# Patient Record
Sex: Female | Born: 1945 | Race: White | Hispanic: No | Smoking: Former smoker
Health system: Southern US, Community
[De-identification: ages and names within clinical notes are randomized; demographics above are authoritative.]

## PROBLEM LIST (undated history)

## (undated) DIAGNOSIS — E079 Disorder of thyroid, unspecified: Secondary | ICD-10-CM

## (undated) DIAGNOSIS — F419 Anxiety disorder, unspecified: Secondary | ICD-10-CM

## (undated) DIAGNOSIS — C801 Malignant (primary) neoplasm, unspecified: Secondary | ICD-10-CM

## (undated) DIAGNOSIS — M1990 Unspecified osteoarthritis, unspecified site: Secondary | ICD-10-CM

## (undated) DIAGNOSIS — F32A Depression, unspecified: Secondary | ICD-10-CM

## (undated) DIAGNOSIS — T8859XA Other complications of anesthesia, initial encounter: Secondary | ICD-10-CM

## (undated) DIAGNOSIS — T4145XA Adverse effect of unspecified anesthetic, initial encounter: Secondary | ICD-10-CM

## (undated) DIAGNOSIS — J189 Pneumonia, unspecified organism: Secondary | ICD-10-CM

## (undated) DIAGNOSIS — R7303 Prediabetes: Secondary | ICD-10-CM

## (undated) DIAGNOSIS — R0602 Shortness of breath: Secondary | ICD-10-CM

## (undated) DIAGNOSIS — M069 Rheumatoid arthritis, unspecified: Secondary | ICD-10-CM

## (undated) DIAGNOSIS — M2550 Pain in unspecified joint: Secondary | ICD-10-CM

## (undated) DIAGNOSIS — F329 Major depressive disorder, single episode, unspecified: Secondary | ICD-10-CM

## (undated) DIAGNOSIS — T7840XA Allergy, unspecified, initial encounter: Secondary | ICD-10-CM

## (undated) DIAGNOSIS — R079 Chest pain, unspecified: Secondary | ICD-10-CM

## (undated) DIAGNOSIS — R011 Cardiac murmur, unspecified: Secondary | ICD-10-CM

## (undated) DIAGNOSIS — E039 Hypothyroidism, unspecified: Secondary | ICD-10-CM

## (undated) DIAGNOSIS — K219 Gastro-esophageal reflux disease without esophagitis: Secondary | ICD-10-CM

## (undated) DIAGNOSIS — I10 Essential (primary) hypertension: Secondary | ICD-10-CM

## (undated) DIAGNOSIS — E119 Type 2 diabetes mellitus without complications: Secondary | ICD-10-CM

## (undated) DIAGNOSIS — K589 Irritable bowel syndrome without diarrhea: Secondary | ICD-10-CM

## (undated) DIAGNOSIS — G4730 Sleep apnea, unspecified: Secondary | ICD-10-CM

## (undated) DIAGNOSIS — E559 Vitamin D deficiency, unspecified: Secondary | ICD-10-CM

## (undated) HISTORY — DX: Chest pain, unspecified: R07.9

## (undated) HISTORY — DX: Allergy, unspecified, initial encounter: T78.40XA

## (undated) HISTORY — DX: Disorder of thyroid, unspecified: E07.9

## (undated) HISTORY — DX: Pain in unspecified joint: M25.50

## (undated) HISTORY — DX: Irritable bowel syndrome, unspecified: K58.9

## (undated) HISTORY — DX: Vitamin D deficiency, unspecified: E55.9

## (undated) HISTORY — DX: Prediabetes: R73.03

## (undated) HISTORY — PX: CHOLECYSTECTOMY: SHX55

## (undated) HISTORY — DX: Hypothyroidism, unspecified: E03.9

## (undated) HISTORY — DX: Type 2 diabetes mellitus without complications: E11.9

## (undated) HISTORY — DX: Gastro-esophageal reflux disease without esophagitis: K21.9

## (undated) HISTORY — DX: Shortness of breath: R06.02

## (undated) HISTORY — DX: Unspecified osteoarthritis, unspecified site: M19.90

---
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
## 1970-11-05 DIAGNOSIS — C801 Malignant (primary) neoplasm, unspecified: Secondary | ICD-10-CM

## 1970-11-05 HISTORY — DX: Malignant (primary) neoplasm, unspecified: C80.1

## 1970-11-05 HISTORY — PX: DILATION AND CURETTAGE OF UTERUS: SHX78

## 1975-11-06 HISTORY — PX: ABDOMINAL HYSTERECTOMY: SHX81

## 1994-11-05 DIAGNOSIS — K219 Gastro-esophageal reflux disease without esophagitis: Secondary | ICD-10-CM

## 1994-11-05 HISTORY — DX: Gastro-esophageal reflux disease without esophagitis: K21.9

## 1998-08-19 ENCOUNTER — Emergency Department (HOSPITAL_COMMUNITY): Admission: EM | Admit: 1998-08-19 | Discharge: 1998-08-19 | Payer: Self-pay | Admitting: Emergency Medicine

## 1999-09-02 ENCOUNTER — Ambulatory Visit: Admission: RE | Admit: 1999-09-02 | Discharge: 1999-09-02 | Payer: Self-pay | Admitting: *Deleted

## 2007-11-06 DIAGNOSIS — J189 Pneumonia, unspecified organism: Secondary | ICD-10-CM

## 2007-11-06 HISTORY — DX: Pneumonia, unspecified organism: J18.9

## 2011-10-17 ENCOUNTER — Ambulatory Visit: Payer: Self-pay | Admitting: Physician Assistant

## 2011-11-06 HISTORY — PX: OTHER SURGICAL HISTORY: SHX169

## 2011-11-14 ENCOUNTER — Ambulatory Visit: Payer: Self-pay | Admitting: Physician Assistant

## 2011-11-14 DIAGNOSIS — E039 Hypothyroidism, unspecified: Secondary | ICD-10-CM

## 2012-04-16 ENCOUNTER — Other Ambulatory Visit: Payer: Self-pay | Admitting: Orthopaedic Surgery

## 2012-04-16 DIAGNOSIS — M25511 Pain in right shoulder: Secondary | ICD-10-CM

## 2012-04-20 ENCOUNTER — Ambulatory Visit
Admission: RE | Admit: 2012-04-20 | Discharge: 2012-04-20 | Disposition: A | Discharge status: Home / Self Care | Payer: Self-pay | Source: Ambulatory Visit | Attending: Orthopaedic Surgery | Admitting: Orthopaedic Surgery

## 2012-04-20 DIAGNOSIS — M25511 Pain in right shoulder: Secondary | ICD-10-CM

## 2012-04-28 ENCOUNTER — Telehealth: Payer: Self-pay

## 2012-04-28 NOTE — Telephone Encounter (Signed)
Linda Atkinson [facility] STATES THEY NEED THE LABS FROM January AND SHE HAD A PE DONE IN June AND IF SHE HAD A  EKG THEY NEED THAT ALSO PLEASE FAX TO [phone] AND THE PHONE NUMBER IS [phone]

## 2012-04-28 NOTE — Telephone Encounter (Signed)
Printed out phone message due to patient only has a paper chart.

## 2012-05-01 ENCOUNTER — Telehealth: Payer: Self-pay

## 2012-05-01 NOTE — Telephone Encounter (Signed)
[facility] is requesting last OV and Labs to be faxed to [phone]. Pt is going into surgery now.

## 2012-05-01 NOTE — Telephone Encounter (Signed)
Last office notes and labs sent with confirmation.

## 2012-07-08 ENCOUNTER — Other Ambulatory Visit: Payer: Self-pay | Admitting: Orthopaedic Surgery

## 2012-07-08 DIAGNOSIS — M25511 Pain in right shoulder: Secondary | ICD-10-CM

## 2012-07-10 ENCOUNTER — Ambulatory Visit
Admission: RE | Admit: 2012-07-10 | Discharge: 2012-07-10 | Disposition: A | Discharge status: Home / Self Care | Payer: Medicare Other | Source: Ambulatory Visit | Attending: Orthopaedic Surgery | Admitting: Orthopaedic Surgery

## 2012-07-10 DIAGNOSIS — M25511 Pain in right shoulder: Secondary | ICD-10-CM

## 2012-07-10 MED ORDER — IOHEXOL 180 MG/ML  SOLN
17.0000 mL | Freq: Once | INTRAMUSCULAR | Status: AC | PRN
  Administered 2012-07-10: 17 mL via INTRA_ARTICULAR

## 2012-08-06 ENCOUNTER — Ambulatory Visit: Payer: Medicare Other | Admitting: Physician Assistant

## 2013-06-10 ENCOUNTER — Other Ambulatory Visit (HOSPITAL_COMMUNITY): Payer: Self-pay | Admitting: Rheumatology

## 2013-06-10 ENCOUNTER — Ambulatory Visit: Payer: Medicare Other | Admitting: Endocrinology

## 2013-06-10 DIAGNOSIS — R6889 Other general symptoms and signs: Secondary | ICD-10-CM

## 2013-06-11 ENCOUNTER — Other Ambulatory Visit: Payer: Self-pay | Admitting: *Deleted

## 2013-06-11 ENCOUNTER — Ambulatory Visit (INDEPENDENT_AMBULATORY_CARE_PROVIDER_SITE_OTHER): Payer: Medicare Other | Admitting: Endocrinology

## 2013-06-11 ENCOUNTER — Encounter: Payer: Self-pay | Admitting: Endocrinology

## 2013-06-11 VITALS — BP 134/82 | HR 80 | Temp 98.1°F | Resp 12 | Ht 67.0 in | Wt 242.5 lb

## 2013-06-11 DIAGNOSIS — E213 Hyperparathyroidism, unspecified: Secondary | ICD-10-CM

## 2013-06-11 DIAGNOSIS — E039 Hypothyroidism, unspecified: Secondary | ICD-10-CM

## 2013-06-11 MED ORDER — SYNTHROID 175 MCG PO TABS: 175.0000 ug | ORAL_TABLET | Freq: Every day | ORAL | Status: DC

## 2013-06-11 NOTE — Patient Instructions (Signed)
Keep hydrated  Synthroid 175ug daily

## 2013-06-11 NOTE — Progress Notes (Signed)
Patient ID: Linda Atkinson, female   DOB: [birth year], 67 y.o.   MRN: [id number]   History of Present Illness:  Chief complaint: High calcium level  She thinks her calcium was found to be high about 10 years ago but no details are available. She was followed by another endocrinologist at that time and surgery not recommended. 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 She also gets sleepy during the day often Also complains of feeling a little unsteady or staggering while walking and feels somewhat weak Since about November of 2013 she has had multiple joint pains, starting with her shoulders but now in practically all of her extremity joints including her feet. She does not complain of any nausea but she complains of excessive thirst over the last 2-3 years which is worse now She has not had any recent fractures She thinks she had passed a kidney stone 2-3 years ago because of severe pain and relief on passing the stone  CALCIUM levels: On 04/29/13 calcium was 12.4 On 06/08/13 calcium is 14.5  Past Medical History  Diagnosis Date  . Allergy   . Arthritis   . Thyroid disease     Past Surgical History  Procedure Laterality Date  . Right shoulder rotater cuff    . Abdominal hysterectomy      History reviewed. No pertinent family history.  Social History:  reports that she has been smoking.  She does not have any smokeless tobacco history on file. Her alcohol and drug histories are not on file.  Allergies: No Known Allergies    Medication List       This list is accurate as of: 06/11/13  8:17 AM.  Always use your most recent med list.               levothyroxine 200 MCG  tablet  Commonly known as:  SYNTHROID, LEVOTHROID  Take 200 mcg by mouth daily before breakfast. Brand name Synthroid     Vitamin D (Ergocalciferol) 50000 UNITS Caps capsule  Commonly known as:  DRISDOL  Take 50,000 Units by mouth 2 (two) times a week.          REVIEW OF SYSTEMS:           No unusual headaches.     Skin: No rash or infections     Thyroid:  She apparently has had primary hypothyroidism for about 20 years and this is generally followed by her primary care physician. No cold intolerance, unusual fatigue at this time. She is not sure when her previous tests were done but recently her TSH in June was 0.26 which was done as part of the panel by the orthopedic surgeon. The free T4 is not available.However her dose has not been changed.     His blood pressure has been normal.     No swelling of feet.     No shortness of breath on exertion.     Bowel habits:  No change. . Abdominal pain not present   PHYSICAL EXAM:  BP 134/82  Pulse 80  Temp(Src) 98.1 F (36.7 C)  Resp 12  Ht 5\' 7"  (1.702 m)  Wt 242 lb 8 oz (109.997 kg)  BMI 37.97 kg/m2  SpO2 94%  GENERAL:  No pallor, clubbing, lymphadenopathy or edema.  Skin:  no rash or pigmentation.  EYES:  Externally normal.  Fundii:  normal discs and vessels.  ENT: Oral mucosa and tongue normal.  THYROID:  Not palpable.  HEART:  NormalS1 and S2; no murmur or click.  CHEST:  Normal shape .  Lungs:  Vescicular breath sounds heard equally.  No crepitations/ wheeze.  ABDOMEN:  No distention.  Liver and spleen not palpable.  No other mass or tenderness.  RECTAL exam:   NEUROLOGICAL: Reflexes are normal bilaterally at biceps.  SPINE AND JOINTS:  Normal without any inflammatory changes or swelling   ASSESSMENT:    Hyperparathyroidism, long-standing but with escalating calcium levels recently She is probably symptomatic with generalized arthralgia, fatigue, increased thirst, gait instability and likely history  of kidney stone about 2 years ago  She appears to have palpable nodule in the left neck; because of her markedly increased PTH level this may be a large adenoma or possibly carcinoma Previously she had been reluctant to have surgery because of potential injury to the laryngeal nerve.  Now she appears to have reviewed more information about the minimally invasive surgery and is agreeable to this as a treatment Discussed that we would do a scan to identify the abnormal parathyroid, presumably she has a solitary adenoma  VITAMIN D deficiency: She has a level less than 10 and has been started on supplementation. Her alkaline phosphatase is increased to about twice normal but she has not had any x-rays to identify any bony changes  PLAN:   Will set her up for a sestamibi parathyroid scan and also surgical consult She should have surgery as soon as possible, discussed risks of severe hypercalcemia and symptoms to look out for including nausea and drowsiness She will also try to keep hydrated as much as possible Do not think that she really needs a bone scan as her symptoms are likely to be from hyperparathyroidism.  Also she may have a degree of osteomalacia with her severe vitamin D deficiency causing the high alkaline phosphate level and this can be followed periodically after treatment  Zakir Henner 06/11/2013, 8:17 AM

## 2013-06-12 ENCOUNTER — Ambulatory Visit (INDEPENDENT_AMBULATORY_CARE_PROVIDER_SITE_OTHER): Payer: Medicare Other | Admitting: General Surgery

## 2013-06-12 ENCOUNTER — Encounter (INDEPENDENT_AMBULATORY_CARE_PROVIDER_SITE_OTHER): Payer: Self-pay | Admitting: General Surgery

## 2013-06-12 VITALS — BP 130/78 | HR 66 | Temp 97.2°F | Resp 15 | Ht 67.5 in | Wt 240.6 lb

## 2013-06-12 DIAGNOSIS — E210 Primary hyperparathyroidism: Secondary | ICD-10-CM

## 2013-06-12 NOTE — Progress Notes (Signed)
Patient ID: Linda Atkinson, female   DOB: November 18, 1945, 67 y.o.   MRN: [id number]  Chief Complaint  Patient presents with  . New Evaluation    hyperthyroidism    HPI [name] is a 67 y.o. female.  The patient is a 67 year old female who is referred by Dr. Lucianne Muss for an evaluation of hyperparathyroidism and hypercalcemia. Pacing she's had a long history of approximately 73-40 years old and known hypercalcemia and hyperparathyroidism. Patient has continued to have bony/muscle pains. Patient is to Dr. Shan Levans for workup of arthritis.  Upon Dr. Remus Blake workup patient has a parathyroid hormone level of 939.9 the calcium level 14.5. History she's had no cramping or kidney stone problems. She describes no GI symptoms at this time however does note some mental haziness as well as a bone pain.  HPI  Past Medical History  Diagnosis Date  . Allergy   . Arthritis   . Thyroid disease   . GERD (gastroesophageal reflux disease)     Past Surgical History  Procedure Laterality Date  . Right shoulder rotater cuff    . Abdominal hysterectomy      Family History  Problem Relation Age of Onset  . Cancer Brother     prostate  . Cancer Maternal Aunt     breast  . Cancer Paternal Aunt     uterine  . Cancer Paternal Grandmother     colon    Social History History  Substance Use Topics  . Smoking status: Current Every Day Smoker  . Smokeless tobacco: Never Used  . Alcohol Use: No    No Known Allergies  Current Outpatient Prescriptions  Medication Sig Dispense Refill  . SYNTHROID 175 MCG tablet Take 1 tablet (175 mcg total) by mouth daily before breakfast.  30 tablet  5  . Vitamin D, Ergocalciferol, (DRISDOL) 50000 UNITS CAPS capsule Take 50,000 Units by mouth 2 (two) times a week.       No current facility-administered medications for this visit.    Review of Systems Review of Systems  Blood pressure 130/78, pulse 66, temperature 97.2 F (36.2 C), temperature source Temporal,  resp. rate 15, height 5' 7.5" (1.715 m), weight 240 lb 9.6 oz (109.135 kg).  Physical Exam Physical Exam  Data Reviewed As above  Sestamibi scan pending  Assessment    67 year old female with primary hyperparathyroidism likely due to an adenoma but however cannot rule out carcinoma.     Plan    1. We will attempt to move up her sestamibi scan to expedite her surgery date. 2. After sestamibi results return will proceed with a minimally invasive parathyroidectomy if amenable and only one adenomas seen. It is one one adenoma will proceed with a routine open parathyroidectomy. 3. I discussed with the patient risk benefits of surgery to include infection, bleeding, possible recurrence of adenoma or missed adenoma, and the possibility for recurrent laryngeal nerve damage.       Marigene Ehlers., Sagar Tengan 06/12/2013, 2:45 PM

## 2013-06-17 ENCOUNTER — Encounter (INDEPENDENT_AMBULATORY_CARE_PROVIDER_SITE_OTHER): Payer: Self-pay

## 2013-06-19 ENCOUNTER — Encounter (HOSPITAL_COMMUNITY)
Admission: RE | Admit: 2013-06-19 | Discharge: 2013-06-19 | Disposition: A | Discharge status: Home / Self Care | Payer: Medicare Other | Source: Ambulatory Visit | Attending: Rheumatology | Admitting: Rheumatology

## 2013-06-19 ENCOUNTER — Encounter (HOSPITAL_COMMUNITY): Payer: Self-pay

## 2013-06-19 DIAGNOSIS — R6889 Other general symptoms and signs: Secondary | ICD-10-CM | POA: Insufficient documentation

## 2013-06-19 MED ORDER — TECHNETIUM TC 99M MEDRONATE IV KIT
25.8000 | PACK | Freq: Once | INTRAVENOUS | Status: AC | PRN
  Administered 2013-06-19: 25.8 via INTRAVENOUS

## 2013-06-24 ENCOUNTER — Encounter (HOSPITAL_COMMUNITY)
Admission: RE | Admit: 2013-06-24 | Discharge: 2013-06-24 | Disposition: A | Discharge status: Home / Self Care | Payer: Medicare Other | Source: Ambulatory Visit | Attending: Endocrinology | Admitting: Endocrinology

## 2013-06-24 DIAGNOSIS — E213 Hyperparathyroidism, unspecified: Secondary | ICD-10-CM | POA: Insufficient documentation

## 2013-06-24 MED ORDER — TECHNETIUM TC 99M SESTAMIBI GENERIC - CARDIOLITE
25.5000 | Freq: Once | INTRAVENOUS | Status: AC | PRN
  Administered 2013-06-24: 26 via INTRAVENOUS

## 2013-06-25 ENCOUNTER — Encounter (HOSPITAL_COMMUNITY): Payer: Medicare Other

## 2013-06-25 ENCOUNTER — Telehealth: Payer: Self-pay | Admitting: *Deleted

## 2013-06-25 NOTE — Telephone Encounter (Signed)
Pt had her scan done yesterday, she wanted to know if you had the results back and what the next step will be for her?

## 2013-06-29 ENCOUNTER — Encounter: Payer: Self-pay | Admitting: Physician Assistant

## 2013-06-29 DIAGNOSIS — M2550 Pain in unspecified joint: Secondary | ICD-10-CM | POA: Insufficient documentation

## 2013-06-30 ENCOUNTER — Other Ambulatory Visit (INDEPENDENT_AMBULATORY_CARE_PROVIDER_SITE_OTHER): Payer: Self-pay | Admitting: General Surgery

## 2013-06-30 ENCOUNTER — Telehealth (INDEPENDENT_AMBULATORY_CARE_PROVIDER_SITE_OTHER): Payer: Self-pay | Admitting: General Surgery

## 2013-06-30 DIAGNOSIS — E213 Hyperparathyroidism, unspecified: Secondary | ICD-10-CM

## 2013-06-30 NOTE — Telephone Encounter (Signed)
Ct schd for 8/29 @ 8:15 [address] and pt notified.Marland KitchenCMET lab order also placed in epic and she will go and have that drawn tomorrow morning per the patient...waiting on a call back from AR then orders will be taken around to Debbie E...it is 3:50 8/26 as of now

## 2013-06-30 NOTE — Telephone Encounter (Signed)
She is supposed to have a parathyroid surgery scheduled by the surgeon's office, she should check with them as she has a large growth on the parathyroid

## 2013-06-30 NOTE — Telephone Encounter (Signed)
Pt is going for bloodwork at Dr. Derrell Lolling and is going for a ct scan on Friday.

## 2013-06-30 NOTE — Telephone Encounter (Signed)
Message copied by June Leap on Tue Jun 30, 2013  3:48 PM ------      Message from: Axel Filler      Created: Tue Jun 30, 2013 10:53 AM       Can we set her up for a CT of her Neck Thin Cuts with IV contrast?      If we can get that done before the day that shes on hold with Debbie we can schedule it.            Thanks      AR ------

## 2013-07-01 ENCOUNTER — Other Ambulatory Visit (INDEPENDENT_AMBULATORY_CARE_PROVIDER_SITE_OTHER): Payer: Self-pay | Admitting: General Surgery

## 2013-07-01 LAB — COMPREHENSIVE METABOLIC PANEL
ALT: 38 U/L — ABNORMAL HIGH (ref 0–35)
AST: 24 U/L (ref 0–37)
Albumin: 4 g/dL (ref 3.5–5.2)
CO2: 33 mEq/L — ABNORMAL HIGH (ref 19–32)
Calcium: 13.9 mg/dL (ref 8.4–10.5)
Chloride: 103 mEq/L (ref 96–112)
Creat: 0.64 mg/dL (ref 0.50–1.10)
Potassium: 2.8 mEq/L — ABNORMAL LOW (ref 3.5–5.3)

## 2013-07-02 ENCOUNTER — Telehealth (INDEPENDENT_AMBULATORY_CARE_PROVIDER_SITE_OTHER): Payer: Self-pay | Admitting: *Deleted

## 2013-07-02 NOTE — Telephone Encounter (Signed)
Spoke to Dr. Derrell Lolling who is aware of patient's elevated calcium and recent lab results.

## 2013-07-03 ENCOUNTER — Encounter (HOSPITAL_COMMUNITY): Payer: Self-pay | Admitting: Pharmacy Technician

## 2013-07-03 ENCOUNTER — Ambulatory Visit
Admission: RE | Admit: 2013-07-03 | Discharge: 2013-07-03 | Disposition: A | Discharge status: Home / Self Care | Payer: Medicare Other | Source: Ambulatory Visit | Attending: General Surgery | Admitting: General Surgery

## 2013-07-03 MED ORDER — IOHEXOL 300 MG/ML  SOLN
75.0000 mL | Freq: Once | INTRAMUSCULAR | Status: AC | PRN
  Administered 2013-07-03: 75 mL via INTRAVENOUS

## 2013-07-03 NOTE — Progress Notes (Signed)
Dr Derrell Lolling-  NEED PRE OP ORDERS PLEASE-   Has PST appt 07/09/13  [facility]

## 2013-07-08 NOTE — Progress Notes (Signed)
Need orders please - pt coming for preop Thurs 07/09/13 - thank you

## 2013-07-09 ENCOUNTER — Encounter (HOSPITAL_COMMUNITY)
Admission: RE | Admit: 2013-07-09 | Discharge: 2013-07-09 | Disposition: A | Discharge status: Home / Self Care | Payer: Medicare Other | Source: Ambulatory Visit | Attending: General Surgery | Admitting: General Surgery

## 2013-07-09 ENCOUNTER — Other Ambulatory Visit (INDEPENDENT_AMBULATORY_CARE_PROVIDER_SITE_OTHER): Payer: Self-pay | Admitting: General Surgery

## 2013-07-09 ENCOUNTER — Encounter (HOSPITAL_COMMUNITY): Payer: Self-pay

## 2013-07-09 ENCOUNTER — Telehealth (INDEPENDENT_AMBULATORY_CARE_PROVIDER_SITE_OTHER): Payer: Self-pay | Admitting: General Surgery

## 2013-07-09 DIAGNOSIS — Z01812 Encounter for preprocedural laboratory examination: Secondary | ICD-10-CM | POA: Insufficient documentation

## 2013-07-09 DIAGNOSIS — E213 Hyperparathyroidism, unspecified: Secondary | ICD-10-CM | POA: Insufficient documentation

## 2013-07-09 HISTORY — DX: Adverse effect of unspecified anesthetic, initial encounter: T41.45XA

## 2013-07-09 HISTORY — DX: Rheumatoid arthritis, unspecified: M06.9

## 2013-07-09 HISTORY — DX: Depression, unspecified: F32.A

## 2013-07-09 HISTORY — DX: Other complications of anesthesia, initial encounter: T88.59XA

## 2013-07-09 HISTORY — DX: Shortness of breath: R06.02

## 2013-07-09 HISTORY — DX: Malignant (primary) neoplasm, unspecified: C80.1

## 2013-07-09 HISTORY — DX: Anxiety disorder, unspecified: F41.9

## 2013-07-09 HISTORY — DX: Pneumonia, unspecified organism: J18.9

## 2013-07-09 HISTORY — DX: Major depressive disorder, single episode, unspecified: F32.9

## 2013-07-09 LAB — CBC
Hemoglobin: 13.3 g/dL (ref 12.0–15.0)
MCH: 28.3 pg (ref 26.0–34.0)
MCV: 83.6 fL (ref 78.0–100.0)
RBC: 4.7 MIL/uL (ref 3.87–5.11)

## 2013-07-09 NOTE — Progress Notes (Signed)
CT Soft tissue neck with contrast 07/03/13 on EPIC

## 2013-07-09 NOTE — Telephone Encounter (Signed)
Pt called with some generalized questions about her surgery.  All questions answered and emotional support offered.  She is trying to prepare for all food, drink and dressings ahead of time.

## 2013-07-09 NOTE — Patient Instructions (Addendum)
20 Linda Atkinson  07/09/2013   Your procedure is scheduled on: 07/13/13  Report to [facility] at 5:30 AM.  Call this number if you have problems the morning of surgery [phone]   Remember:   Do not eat food or drink liquids After Midnight.     Take these medicines the morning of surgery with A SIP OF WATER: synthroid   Do not wear jewelry, make-up or nail polish.  Do not wear lotions, powders, or perfumes. You may wear deodorant.  Do not shave 48 hours prior to surgery. Men may shave face and neck.  Do not bring valuables to the hospital.  Contacts, dentures or bridgework may not be worn into surgery.  Leave suitcase in the car. After surgery it may be brought to your room.  For patients admitted to the hospital, checkout time is 11:00 AM the day of discharge.   Birdie Sons, RN  pre op nurse call if needed [phone]    FAILURE TO FOLLOW THESE INSTRUCTIONS MAY RESULT IN CANCELLATION OF YOUR SURGERY   Patient Signature: ___________________________________________

## 2013-07-10 ENCOUNTER — Other Ambulatory Visit (INDEPENDENT_AMBULATORY_CARE_PROVIDER_SITE_OTHER): Payer: Self-pay | Admitting: General Surgery

## 2013-07-10 ENCOUNTER — Telehealth (INDEPENDENT_AMBULATORY_CARE_PROVIDER_SITE_OTHER): Payer: Self-pay | Admitting: General Surgery

## 2013-07-10 NOTE — Telephone Encounter (Signed)
Called pt to let her know KCl was called in to her pharmacy [facility] on [address]  KCl 50 mEq po BID x 5 doses.  She will have her K+ checked at the hospital on DOS for any further therapy.  She understands and will pick it up this afternoon.

## 2013-07-10 NOTE — Progress Notes (Signed)
POTASSIUM TO BE DRAWN DAY OF SURGERY PREOP - PER DR. Derrell Lolling - ORDER PLACED IN EPIC AND NOTE ON FRONT OF PT'S CHART FOR SHORT STAY TO DRAW POTASSIUM ON PT'S ARRIVAL FOR SURGERY.

## 2013-07-10 NOTE — Telephone Encounter (Signed)
Called to ask them to put order in to draw K morning of surgery.Marland KitchenMarland KitchenPat over at [facility] stated that she had put that order in

## 2013-07-10 NOTE — Progress Notes (Signed)
Dr. Jacinto Halim office called. Spoke with Britta Mccreedy RN to notify Dr. Derrell Lolling of abnormal labs on CMET especially calcium and potassium levels. She said he would be in this afternoon  and she would make sure he knows

## 2013-07-13 ENCOUNTER — Observation Stay (HOSPITAL_COMMUNITY)
Admission: RE | Admit: 2013-07-13 | Discharge: 2013-07-14 | Disposition: A | Discharge status: Home / Self Care | Payer: Medicare Other | Source: Ambulatory Visit | Attending: General Surgery | Admitting: General Surgery

## 2013-07-13 ENCOUNTER — Encounter (HOSPITAL_COMMUNITY): Payer: Self-pay | Admitting: *Deleted

## 2013-07-13 ENCOUNTER — Ambulatory Visit (HOSPITAL_COMMUNITY): Payer: Medicare Other | Admitting: Anesthesiology

## 2013-07-13 ENCOUNTER — Encounter (HOSPITAL_COMMUNITY): Payer: Self-pay | Admitting: Anesthesiology

## 2013-07-13 ENCOUNTER — Encounter (HOSPITAL_COMMUNITY)
Admission: RE | Disposition: A | Discharge status: Home / Self Care | Payer: Self-pay | Source: Ambulatory Visit | Attending: General Surgery

## 2013-07-13 DIAGNOSIS — D351 Benign neoplasm of parathyroid gland: Principal | ICD-10-CM | POA: Insufficient documentation

## 2013-07-13 DIAGNOSIS — M899 Disorder of bone, unspecified: Secondary | ICD-10-CM | POA: Insufficient documentation

## 2013-07-13 DIAGNOSIS — E210 Primary hyperparathyroidism: Secondary | ICD-10-CM

## 2013-07-13 DIAGNOSIS — IMO0001 Reserved for inherently not codable concepts without codable children: Secondary | ICD-10-CM | POA: Insufficient documentation

## 2013-07-13 DIAGNOSIS — K219 Gastro-esophageal reflux disease without esophagitis: Secondary | ICD-10-CM | POA: Insufficient documentation

## 2013-07-13 DIAGNOSIS — E213 Hyperparathyroidism, unspecified: Secondary | ICD-10-CM | POA: Insufficient documentation

## 2013-07-13 HISTORY — PX: PARATHYROIDECTOMY: SHX19

## 2013-07-13 SURGERY — PARATHYROIDECTOMY
Anesthesia: General | Site: Neck | Laterality: Right | Wound class: Clean

## 2013-07-13 MED ORDER — CEFAZOLIN SODIUM-DEXTROSE 2-3 GM-% IV SOLR
INTRAVENOUS | Status: AC
  Filled 2013-07-13: qty 50

## 2013-07-13 MED ORDER — FENTANYL CITRATE 0.05 MG/ML IJ SOLN
25.0000 ug | INTRAMUSCULAR | Status: DC | PRN
  Administered 2013-07-13: 50 ug via INTRAVENOUS

## 2013-07-13 MED ORDER — PROMETHAZINE HCL 25 MG/ML IJ SOLN: 6.2500 mg | INTRAMUSCULAR | Status: DC | PRN

## 2013-07-13 MED ORDER — MAGNESIUM OXIDE 400 (241.3 MG) MG PO TABS
400.0000 mg | ORAL_TABLET | Freq: Two times a day (BID) | ORAL | Status: DC
  Administered 2013-07-13 (×2): 400 mg via ORAL
  Filled 2013-07-13 (×4): qty 1

## 2013-07-13 MED ORDER — SODIUM CHLORIDE 0.9 % IV SOLN: INTRAVENOUS | Status: DC

## 2013-07-13 MED ORDER — EPHEDRINE SULFATE 50 MG/ML IJ SOLN
INTRAMUSCULAR | Status: DC | PRN
  Administered 2013-07-13 (×2): 10 mg via INTRAVENOUS

## 2013-07-13 MED ORDER — CALCIUM CARBONATE-VITAMIN D 500-200 MG-UNIT PO TABS
2.0000 | ORAL_TABLET | Freq: Four times a day (QID) | ORAL | Status: DC
  Administered 2013-07-13 (×4): 2 via ORAL
  Filled 2013-07-13 (×8): qty 2

## 2013-07-13 MED ORDER — SODIUM CHLORIDE 0.9 % IV SOLN
INTRAVENOUS | Status: DC | PRN
  Administered 2013-07-13 (×2): via INTRAVENOUS

## 2013-07-13 MED ORDER — MEPERIDINE HCL 50 MG/ML IJ SOLN: 6.2500 mg | INTRAMUSCULAR | Status: DC | PRN

## 2013-07-13 MED ORDER — HYDROMORPHONE HCL PF 1 MG/ML IJ SOLN: 1.0000 mg | INTRAMUSCULAR | Status: DC | PRN

## 2013-07-13 MED ORDER — 0.9 % SODIUM CHLORIDE (POUR BTL) OPTIME
TOPICAL | Status: DC | PRN
  Administered 2013-07-13: 1000 mL

## 2013-07-13 MED ORDER — FENTANYL CITRATE 0.05 MG/ML IJ SOLN
INTRAMUSCULAR | Status: AC
  Filled 2013-07-13: qty 2

## 2013-07-13 MED ORDER — SUFENTANIL CITRATE 50 MCG/ML IV SOLN
INTRAVENOUS | Status: DC | PRN
  Administered 2013-07-13: 20 ug via INTRAVENOUS
  Administered 2013-07-13: 10 ug via INTRAVENOUS
  Administered 2013-07-13: 5 ug via INTRAVENOUS

## 2013-07-13 MED ORDER — ONDANSETRON HCL 4 MG/2ML IJ SOLN
INTRAMUSCULAR | Status: DC | PRN
  Administered 2013-07-13: 4 mg via INTRAVENOUS

## 2013-07-13 MED ORDER — CEFAZOLIN SODIUM-DEXTROSE 2-3 GM-% IV SOLR
2.0000 g | INTRAVENOUS | Status: AC
  Administered 2013-07-13: 2 g via INTRAVENOUS

## 2013-07-13 MED ORDER — HYDROCODONE-ACETAMINOPHEN 5-325 MG PO TABS
1.0000 | ORAL_TABLET | ORAL | Status: DC | PRN
  Administered 2013-07-13 (×2): 2 via ORAL
  Filled 2013-07-13 (×2): qty 2

## 2013-07-13 MED ORDER — LEVOTHYROXINE SODIUM 175 MCG PO TABS
175.0000 ug | ORAL_TABLET | Freq: Every day | ORAL | Status: DC
  Administered 2013-07-14: 175 ug via ORAL
  Filled 2013-07-13 (×2): qty 1

## 2013-07-13 MED ORDER — MIDAZOLAM HCL 5 MG/5ML IJ SOLN
INTRAMUSCULAR | Status: DC | PRN
  Administered 2013-07-13: 2 mg via INTRAVENOUS

## 2013-07-13 MED ORDER — DEXTROSE-NACL 5-0.9 % IV SOLN
INTRAVENOUS | Status: DC
  Administered 2013-07-13 (×2): via INTRAVENOUS

## 2013-07-13 MED ORDER — GLYCOPYRROLATE 0.2 MG/ML IJ SOLN
INTRAMUSCULAR | Status: DC | PRN
  Administered 2013-07-13: .7 mg via INTRAVENOUS

## 2013-07-13 MED ORDER — SUCCINYLCHOLINE CHLORIDE 20 MG/ML IJ SOLN
INTRAMUSCULAR | Status: DC | PRN
  Administered 2013-07-13: 100 mg via INTRAVENOUS

## 2013-07-13 MED ORDER — PROPOFOL 10 MG/ML IV BOLUS
INTRAVENOUS | Status: DC | PRN
  Administered 2013-07-13: 170 mg via INTRAVENOUS

## 2013-07-13 MED ORDER — DEXAMETHASONE SODIUM PHOSPHATE 10 MG/ML IJ SOLN
INTRAMUSCULAR | Status: DC | PRN
  Administered 2013-07-13: 10 mg via INTRAVENOUS

## 2013-07-13 MED ORDER — CISATRACURIUM BESYLATE (PF) 10 MG/5ML IV SOLN
INTRAVENOUS | Status: DC | PRN
  Administered 2013-07-13: 5 mg via INTRAVENOUS
  Administered 2013-07-13 (×2): 2 mg via INTRAVENOUS

## 2013-07-13 MED ORDER — LIDOCAINE HCL (CARDIAC) 20 MG/ML IV SOLN
INTRAVENOUS | Status: DC | PRN
  Administered 2013-07-13: 100 mg via INTRAVENOUS

## 2013-07-13 MED ORDER — NEOSTIGMINE METHYLSULFATE 1 MG/ML IJ SOLN
INTRAMUSCULAR | Status: DC | PRN
  Administered 2013-07-13: 5 mg via INTRAVENOUS

## 2013-07-13 MED ORDER — ONDANSETRON HCL 4 MG/2ML IJ SOLN: 4.0000 mg | Freq: Four times a day (QID) | INTRAMUSCULAR | Status: DC | PRN

## 2013-07-13 SURGICAL SUPPLY — 40 items
ADH SKN CLS APL DERMABOND .7 (GAUZE/BANDAGES/DRESSINGS) ×1
APL SKNCLS STERI-STRIP NONHPOA (GAUZE/BANDAGES/DRESSINGS)
ATTRACTOMAT 16X20 MAGNETIC DRP (DRAPES) ×2 IMPLANT
BENZOIN TINCTURE PRP APPL 2/3 (GAUZE/BANDAGES/DRESSINGS) IMPLANT
BLADE HEX COATED 2.75 (ELECTRODE) ×2 IMPLANT
BLADE SURG 15 STRL LF DISP TIS (BLADE) ×1 IMPLANT
BLADE SURG 15 STRL SS (BLADE) ×2
CANISTER SUCTION 2500CC (MISCELLANEOUS) ×2 IMPLANT
CHLORAPREP W/TINT 26ML (MISCELLANEOUS) ×2 IMPLANT
CLIP TI MEDIUM 6 (CLIP) ×4 IMPLANT
CLIP TI WIDE RED SMALL 6 (CLIP) ×4 IMPLANT
CLOTH BEACON ORANGE TIMEOUT ST (SAFETY) ×2 IMPLANT
DERMABOND ADVANCED (GAUZE/BANDAGES/DRESSINGS) ×1
DERMABOND ADVANCED .7 DNX12 (GAUZE/BANDAGES/DRESSINGS) IMPLANT
DISSECTOR ROUND CHERRY 3/8 STR (MISCELLANEOUS) ×2 IMPLANT
DRAPE PED LAPAROTOMY (DRAPES) ×2 IMPLANT
ELECT REM PT RETURN 9FT ADLT (ELECTROSURGICAL) ×2
ELECTRODE REM PT RTRN 9FT ADLT (ELECTROSURGICAL) ×1 IMPLANT
GAUZE SPONGE 4X4 16PLY XRAY LF (GAUZE/BANDAGES/DRESSINGS) ×2 IMPLANT
GLOVE BIO SURGEON STRL SZ7.5 (GLOVE) ×2 IMPLANT
GOWN STRL REIN XL XLG (GOWN DISPOSABLE) ×6 IMPLANT
HEMOSTAT SURGICEL 2X4 FIBR (HEMOSTASIS) ×1 IMPLANT
KIT BASIN OR (CUSTOM PROCEDURE TRAY) ×2 IMPLANT
NS IRRIG 1000ML POUR BTL (IV SOLUTION) ×2 IMPLANT
PACK BASIC VI WITH GOWN DISP (CUSTOM PROCEDURE TRAY) ×2 IMPLANT
PENCIL BUTTON HOLSTER BLD 10FT (ELECTRODE) ×2 IMPLANT
SHEARS HARMONIC 9CM CVD (BLADE) ×2 IMPLANT
SPONGE GAUZE 4X4 12PLY (GAUZE/BANDAGES/DRESSINGS) IMPLANT
STAPLER VISISTAT 35W (STAPLE) ×2 IMPLANT
STRIP CLOSURE SKIN 1/2X4 (GAUZE/BANDAGES/DRESSINGS) IMPLANT
SUT MNCRL AB 4-0 PS2 18 (SUTURE) ×2 IMPLANT
SUT SILK 2 0 (SUTURE)
SUT SILK 2 0 SH (SUTURE) IMPLANT
SUT SILK 2-0 18XBRD TIE 12 (SUTURE) IMPLANT
SUT VIC AB 3-0 SH 18 (SUTURE) ×3 IMPLANT
SYR BULB IRRIGATION 50ML (SYRINGE) ×2 IMPLANT
TOWEL OR 17X26 10 PK STRL BLUE (TOWEL DISPOSABLE) ×2 IMPLANT
TOWEL OR NON WOVEN STRL DISP B (DISPOSABLE) ×2 IMPLANT
WATER STERILE IRR 1500ML POUR (IV SOLUTION) ×1 IMPLANT
YANKAUER SUCT BULB TIP 10FT TU (MISCELLANEOUS) ×2 IMPLANT

## 2013-07-13 NOTE — Anesthesia Postprocedure Evaluation (Signed)
  Anesthesia Post-op Note  Patient: Linda Atkinson  Procedure(s) Performed: Procedure(s) (LRB): RIGHT INFERIOR PARATHYROIDECTOMY (Right)  Patient Location: PACU  Anesthesia Type: General  Level of Consciousness: awake and alert   Airway and Oxygen Therapy: Patient Spontanous Breathing  Post-op Pain: mild  Post-op Assessment: Post-op Vital signs reviewed, Patient's Cardiovascular Status Stable, Respiratory Function Stable, Patent Airway and No signs of Nausea or vomiting  Last Vitals:  Filed Vitals:   07/13/13 1115  BP: 165/59  Pulse: 90  Temp: 37 C  Resp: 18    Post-op Vital Signs: stable   Complications: No apparent anesthesia complications

## 2013-07-13 NOTE — Op Note (Signed)
Pre Operative Diagnosis:  hyperparathyroidism  Post Operative Diagnosis: right inferior parathyroid adenoma  Procedure: R inferior parathyroidectomy  Surgeon: Dr. Axel Filler  Assistant: Dr. Claud Kelp  Anesthesia: GETA  EBL: 5cc  Complications: none  Counts: reported as correct x 2  Findings:  R inferior parathyroid adenoma that was lobulated with a central calcification within the mass.  The mass extended into the superior mediastinum and up to the carotid bifurcation.  Minimal thyroid tissue seen on dissection.  Indications for procedure:  The patient is a 67 year old female with a multiple year history of hyperparathyroidism. Patient underwent CT scan as well as a sestamibi scan which visualized the right inferior parathyroid adenoma as well as a mass near the carotid bifurcation. Patient was seen by her endocrinologist Dr. Lucianne Muss and was referred for surgical removal of her parathyroid adenoma.  Details of the procedure:  The patient was taken back to the operating room. The patient was placed in supine position with bilateral SCDs in place. After appropriate anitbiotics were confirmed, a time-out was confirmed and all facts were verified. A  5cm incision was made approximately 2 fingerbreadths above the sternal notch. Bovie cautery was used to maintain hemostasis dissection was carried down through the platysma. The platysma was elevated and flaps were created superiorly and inferiorly to the thyroid cartilage as well as the sternal notch, repsectively. The strap muscles were identified in the midline and separated. right -sided strap muscles were elevated off the anterior surface of the thyroid, the minimal amount that was seen. This dissection was carried laterally.   A soft lobulated mass was seen laterally and delivered into the wound.  This was retracted into the wound. This mass was then bluntly dissected or way from the surrounding tissue. Posteriorly the carotid bifurcation  could be seen. The superior portion of the wound was dissected away clips and the harmonic scalpel were used to take any blood vessels. We then proceeded to inferiorly develop a plane dissection. At approximately the middle portion of this lobulated mass a firm calcific portion of the mass was palpated. With superior retraction the inferior portion of the adenoma could be palpated. The inferior portion and any blood vessels were clipped and taken with a harmonic scalpel. The surrounding alveolar tissue was bluntly dissected away. The mass was then excised with the stitch placed on the superior portion of the parathyroid mass. This was sent down to pathology for frozen section. After results the frozen section were related to [place] in the operating room, the area was irrigated out. The dissection bed was hemostatic. We placed fibrillar hemostatic agent into the wound. Strap muscles were then reapproximated in the midline with interrupted 3-0 Vicryl stitches. The platysma was reapproximated using 3-0 Vicryl stitches in interrupted fashion. Skin was then reapproximated using a running subcuticular 4-0 Monocryl. The skin was then dressed with Dermabond. The patient was taken to the recovery room in stable condition.  The patient was taken to the recovery room in stable condition.

## 2013-07-13 NOTE — Preoperative (Signed)
Beta Blockers   Reason not to administer Beta Blockers:Not Applicable 

## 2013-07-13 NOTE — H&P (Signed)
HPI  Linda Atkinson is a 67 y.o. female. The patient is a 67 year old female who is referred by Dr. Lucianne Muss for an evaluation of hyperparathyroidism and hypercalcemia. Pacing she's had a long history of approximately 1-18 years old and known hypercalcemia and hyperparathyroidism. Patient has continued to have bony/muscle pains. Patient is to Dr. Shan Levans for workup of arthritis.  Upon Dr. Remus Blake workup patient has a parathyroid hormone level of 939.9 the calcium level 14.5. History she's had no cramping or kidney stone problems. She describes no GI symptoms at this time however does note some mental haziness as well as a bone pain.  HPI  Past Medical History   Diagnosis  Date   .  Allergy    .  Arthritis    .  Thyroid disease    .  GERD (gastroesophageal reflux disease)     Past Surgical History   Procedure  Laterality  Date   .  Right shoulder rotater cuff     .  Abdominal hysterectomy      Family History   Problem  Relation  Age of Onset   .  Cancer  Brother      prostate   .  Cancer  Maternal Aunt      breast   .  Cancer  Paternal Aunt      uterine   .  Cancer  Paternal Grandmother      colon   Social History  History   Substance Use Topics   .  Smoking status:  Current Every Day Smoker   .  Smokeless tobacco:  Never Used   .  Alcohol Use:  No   No Known Allergies  Current Outpatient Prescriptions   Medication  Sig  Dispense  Refill   .  SYNTHROID 175 MCG tablet  Take 1 tablet (175 mcg total) by mouth daily before breakfast.  30 tablet  5   .  Vitamin D, Ergocalciferol, (DRISDOL) 50000 UNITS CAPS capsule  Take 50,000 Units by mouth 2 (two) times a week.      No current facility-administered medications for this visit.   Review of Systems  Review of Systems  Blood pressure 130/78, pulse 66, temperature 97.2 F (36.2 C), temperature source Temporal, resp. rate 15, height 5' 7.5" (1.715 m), weight 240 lb 9.6 oz (109.135 kg).  Physical Exam  Physical Exam  Data Reviewed   As above  Sestamibi scan pending  Assessment  67 year old female with primary hyperparathyroidism likely due to an adenoma but however cannot rule out carcinoma.  Plan  1.  We are going to proceed with a right sided parathyroidectomy and possible hemithyroidectomy.  Any visible lymph nodes will be harvested.  2. I discussed with the patient risk benefits of surgery to include infection, bleeding, possible recurrence of adenoma or missed adenoma, and the possibility for recurrent laryngeal nerve damage

## 2013-07-13 NOTE — Anesthesia Preprocedure Evaluation (Addendum)
Anesthesia Evaluation  Patient identified by MRN, date of birth, ID band Patient awake    Reviewed: Allergy & Precautions, H&P , NPO status , Patient's Chart, lab work & pertinent test results  Airway Mallampati: II TM Distance: >3 FB Neck ROM: Full    Dental no notable dental hx.    Pulmonary neg pulmonary ROS, Current Smoker,  breath sounds clear to auscultation  Pulmonary exam normal       Cardiovascular negative cardio ROS  Rhythm:Regular Rate:Normal     Neuro/Psych PSYCHIATRIC DISORDERS Anxiety Depression negative neurological ROS  negative psych ROS   GI/Hepatic negative GI ROS, Neg liver ROS,   Endo/Other  Hypothyroidism Morbid obesity  Renal/GU negative Renal ROS  negative genitourinary   Musculoskeletal negative musculoskeletal ROS (+)   Abdominal   Peds negative pediatric ROS (+)  Hematology negative hematology ROS (+)   Anesthesia Other Findings Upper bridge  Reproductive/Obstetrics negative OB ROS                          Anesthesia Physical Anesthesia Plan  ASA: III  Anesthesia Plan: General   Post-op Pain Management:    Induction: Intravenous  Airway Management Planned: Oral ETT  Additional Equipment:   Intra-op Plan:   Post-operative Plan: Extubation in OR  Informed Consent: I have reviewed the patients History and Physical, chart, labs and discussed the procedure including the risks, benefits and alternatives for the proposed anesthesia with the patient or authorized representative who has indicated his/her understanding and acceptance.   Dental advisory given  Plan Discussed with: CRNA  Anesthesia Plan Comments: (Avoid tylenol )        Anesthesia Quick Evaluation

## 2013-07-13 NOTE — Transfer of Care (Signed)
Immediate Anesthesia Transfer of Care Note  Patient: Linda Atkinson  Procedure(s) Performed: Procedure(s): RIGHT INFERIOR PARATHYROIDECTOMY (Right)  Patient Location: PACU  Anesthesia Type:General  Level of Consciousness: sedated  Airway & Oxygen Therapy: Patient Spontanous Breathing and Patient connected to face mask oxygen  Post-op Assessment: Report given to PACU RN and Post -op Vital signs reviewed and stable  Post vital signs: Reviewed and stable  Complications: No apparent anesthesia complications

## 2013-07-13 NOTE — Anesthesia Procedure Notes (Signed)
Procedure Name: Intubation Date/Time: 07/13/2013 7:41 AM Performed by: Florene Route Pre-anesthesia Checklist: Patient identified Patient Re-evaluated:Patient Re-evaluated prior to inductionOxygen Delivery Method: Circle system utilized Preoxygenation: Pre-oxygenation with 100% oxygen Intubation Type: IV induction Ventilation: Mask ventilation without difficulty and Oral airway inserted - appropriate to patient size Laryngoscope Size: Hyacinth Meeker and 2 Grade View: Grade I Tube type: Reinforced Tube size: 7.5 mm Number of attempts: 1 Airway Equipment and Method: Stylet Placement Confirmation: ETT inserted through vocal cords under direct vision,  breath sounds checked- equal and bilateral and positive ETCO2 Secured at: 21 cm Tube secured with: Tape Dental Injury: Teeth and Oropharynx as per pre-operative assessment

## 2013-07-14 ENCOUNTER — Telehealth (INDEPENDENT_AMBULATORY_CARE_PROVIDER_SITE_OTHER): Payer: Self-pay | Admitting: General Surgery

## 2013-07-14 ENCOUNTER — Encounter (HOSPITAL_COMMUNITY): Payer: Self-pay | Admitting: General Surgery

## 2013-07-14 ENCOUNTER — Other Ambulatory Visit (INDEPENDENT_AMBULATORY_CARE_PROVIDER_SITE_OTHER): Payer: Self-pay | Admitting: General Surgery

## 2013-07-14 DIAGNOSIS — E213 Hyperparathyroidism, unspecified: Secondary | ICD-10-CM

## 2013-07-14 LAB — MAGNESIUM: Magnesium: 1.5 mg/dL (ref 1.5–2.5)

## 2013-07-14 LAB — BASIC METABOLIC PANEL
BUN: 6 mg/dL (ref 6–23)
CO2: 28 mEq/L (ref 19–32)
Calcium: 14 mg/dL (ref 8.4–10.5)
Chloride: 103 mEq/L (ref 96–112)
GFR calc Af Amer: >90 mL/min (ref >90)
GFR calc non Af Amer: >90 mL/min (ref >90)
Glucose, Bld: 171 mg/dL — ABNORMAL HIGH (ref 70–99)
Glucose, Bld: 158 mg/dL — ABNORMAL HIGH (ref 70–99)
Potassium: 3.5 mEq/L (ref 3.5–5.1)
Sodium: 136 mEq/L (ref 135–145)
Sodium: 135 mEq/L (ref 135–145)

## 2013-07-14 MED ORDER — PNEUMOCOCCAL VAC POLYVALENT 25 MCG/0.5ML IJ INJ: 0.5000 mL | INJECTION | INTRAMUSCULAR | Status: DC | PRN

## 2013-07-14 MED ORDER — PNEUMOCOCCAL VAC POLYVALENT 25 MCG/0.5ML IJ INJ
0.5000 mL | INJECTION | Freq: Once | INTRAMUSCULAR | Status: DC
  Filled 2013-07-14: qty 0.5

## 2013-07-14 MED ORDER — MAGNESIUM OXIDE 400 (241.3 MG) MG PO TABS: 400.0000 mg | ORAL_TABLET | Freq: Two times a day (BID) | ORAL | Status: DC

## 2013-07-14 MED ORDER — CALCIUM CARBONATE-VITAMIN D 500-200 MG-UNIT PO TABS: 2.0000 | ORAL_TABLET | Freq: Four times a day (QID) | ORAL | Status: DC

## 2013-07-14 NOTE — Progress Notes (Signed)
Paged regarding doctor regarding critical calcium level of 14.0, MD Derrell Lolling is aware and states the issue is chronic in nature,, awaiting call back Stanford Breed RN 07-14-2013 8:13am

## 2013-07-14 NOTE — Care Management Note (Signed)
    Page 1 of 1   07/14/2013     11:03:05 AM   CARE MANAGEMENT NOTE 07/14/2013  Patient:  Linda Atkinson, Linda Atkinson   Account Number:  [id number]  Date Initiated:  07/14/2013  Documentation initiated by:  Lorenda Ishihara  Subjective/Objective Assessment:   67 yo female admitted s/p parathyroidectomy. PTA lived at home alone.     Action/Plan:   Home when stable   Anticipated DC Date:  07/14/2013   Anticipated DC Plan:  HOME/SELF CARE      DC Planning Services  CM consult      Choice offered to / List presented to:             Status of service:  Completed, signed off Medicare Important Message given?   (If response is "NO", the following Medicare IM given date fields will be blank) Date Medicare IM given:   Date Additional Medicare IM given:    Discharge Disposition:  HOME/SELF CARE  Per UR Regulation:  Reviewed for med. necessity/level of care/duration of stay  If discussed at Long Length of Stay Meetings, dates discussed:    Comments:

## 2013-07-14 NOTE — Discharge Summary (Signed)
Physician Discharge Summary  Patient ID: BREANNAH KRATT MRN: [id number] DOB/AGE: [birth year] 66 y.o.  Admit date: 07/13/2013 Discharge date: 07/14/2013  Admission Diagnoses:s/p R parathyroidectomy  Discharge Diagnoses:  same Active Problems:   * No active hospital problems. *   Discharged Condition: good  Hospital Course: Pt is s/p R parathyroidectomy.  She was admited overnight for Ca status and obs.  She was tolerating a regular diet, had good pain control, and had normal bowel function.  Her Ca continued to be elevated on AM BMP, however this is likely chronic in nature.  Pt was started on Ca suppl.  She was deemed stable for DC and Dc'd home  Consults: None  Significant Diagnostic Studies: none  Treatments: surgery: 07/13/2013  Discharge Exam: Blood pressure 165/71, pulse 72, temperature 98.1 F (36.7 C), temperature source Oral, resp. rate 18, height 5' 7.5" (1.715 m), weight 232 lb 10.6 oz (105.534 kg), SpO2 96.00%. General appearance: alert and cooperative Cardio: regular rate and rhythm, S1, S2 normal, no murmur, click, rub or gallop GI: soft, non-tender; bowel sounds normal; no masses,  no organomegaly Incision/Wound: c/d/i  Disposition: Final discharge disposition not confirmed  Discharge Orders   Future Appointments Provider Department Dept Phone   07/16/2013 11:10 AM Axel Filler, MD [facility], [place] [phone]   Future Orders Complete By Expires   Discharge patient  As directed        Medication List    STOP taking these medications       Vitamin D (Ergocalciferol) 50000 UNITS Caps capsule  Commonly known as:  DRISDOL      TAKE these medications       calcium-vitamin D 500-200 MG-UNIT per tablet  Commonly known as:  OSCAL WITH D  Take 2 tablets by mouth 4 (four) times daily.     ibuprofen 200 MG tablet  Commonly known as:  ADVIL,MOTRIN  Take 200 mg by mouth every 6 (six) hours as needed for pain.     levothyroxine 175 MCG tablet   Commonly known as:  SYNTHROID, LEVOTHROID  Take 175 mcg by mouth daily before breakfast.     magnesium oxide 400 (241.3 MG) MG tablet  Commonly known as:  MAG-OX  Take 1 tablet (400 mg total) by mouth 2 (two) times daily.           Follow-up Information   Follow up with Lajean Saver, MD. Schedule an appointment as soon as possible for a visit in 2 weeks.   Specialty:  General Surgery   Contact information:   [address] [place] [address] [phone]       Signed: Marigene Ehlers., Jed Limerick 07/14/2013, 6:58 AM

## 2013-07-14 NOTE — Progress Notes (Signed)
CRITICAL VALUE ALERT  Critical value received: Calcium14.2 Date of notification:  07/14/13  Time of notification:  0515  Critical value read back:yes  Nurse who received alert:  Redmond Pulling RN  MD notified (1st page):  Dr Michaell Cowing  Time of first page:  0610  MD notified (2nd page):  Time of second page:  Responding MD:  Dr Michaell Cowing  Time MD responded:  [phone]  Order received for BMET

## 2013-07-14 NOTE — Telephone Encounter (Signed)
Per AR po appt for 9/11 was cancelled since the patient was just operated on mon 9/8.Marland KitchenMarland KitchenLMOM 07/14/13@12 :20 asking patient to return my call tomorrow since I will be out of the office..I need to R/s her routine post op appt for 2Wks and tell her about some labs that he wants drawn prior to her appt..Lab orders have been entered- Ca level and PTH level

## 2013-07-15 ENCOUNTER — Telehealth (INDEPENDENT_AMBULATORY_CARE_PROVIDER_SITE_OTHER): Payer: Self-pay

## 2013-07-15 ENCOUNTER — Telehealth (INDEPENDENT_AMBULATORY_CARE_PROVIDER_SITE_OTHER): Payer: Self-pay | Admitting: General Surgery

## 2013-07-15 NOTE — Telephone Encounter (Signed)
Made patient's post op appt for 9/22 and ask her to be here at 11...also made her aware of labs that he wants drawn prior to her visit and mailed a copy of the lab orders as well...patient is aware and in agreement with POC at this time...patient stated that she was having a lot of gassy abd pain and has not had a BM since her surgery.Marland KitchenMarland KitchenI advised patient to increase her fluid intake and try some MOM and possibly a stool softener..patient verbalized POC at this time and will call us back if problem persist

## 2013-07-15 NOTE — Telephone Encounter (Signed)
I discussed with Linda Atkinson her pathology and it was only a parathyroid adenoma

## 2013-07-15 NOTE — Telephone Encounter (Signed)
The pharmacist called about a prescription the pt brought in.  It is written for Percocet just with the name Ourada on it, no first name or dob.  I confirmed by reading her chart and talking to Dr Michaelle Birks that it was his pt and he wrote the script yesterday.  I notified the pharmacist.  He will fill it.

## 2013-07-16 ENCOUNTER — Encounter (INDEPENDENT_AMBULATORY_CARE_PROVIDER_SITE_OTHER): Payer: Medicare Other | Admitting: General Surgery

## 2013-07-24 LAB — COMPREHENSIVE METABOLIC PANEL
AST: 13 U/L (ref 0–37)
Alkaline Phosphatase: 281 U/L — ABNORMAL HIGH (ref 39–117)
BUN: 8 mg/dL (ref 6–23)
Calcium: 10.3 mg/dL (ref 8.4–10.5)
Chloride: 107 mEq/L (ref 96–112)
Creat: 0.77 mg/dL (ref 0.50–1.10)
Glucose, Bld: 84 mg/dL (ref 70–99)

## 2013-07-27 ENCOUNTER — Ambulatory Visit (INDEPENDENT_AMBULATORY_CARE_PROVIDER_SITE_OTHER): Payer: Medicare Other | Admitting: General Surgery

## 2013-07-27 ENCOUNTER — Encounter (INDEPENDENT_AMBULATORY_CARE_PROVIDER_SITE_OTHER): Payer: Self-pay | Admitting: General Surgery

## 2013-07-27 VITALS — BP 128/76 | HR 68 | Temp 98.0°F | Resp 14 | Ht 67.5 in | Wt 227.8 lb

## 2013-07-27 DIAGNOSIS — Z9889 Other specified postprocedural states: Secondary | ICD-10-CM

## 2013-07-27 NOTE — Addendum Note (Signed)
Addended by: June Leap on: 07/27/2013 12:06 PM   Modules accepted: Orders

## 2013-07-27 NOTE — Progress Notes (Signed)
Patient ID: Linda Atkinson, female   DOB: 1946/08/08, 67 y.o.   MRN: [id number] The patient is a 67 year old female with hyperparathyroidism secondary to a parathyroid adenoma. Patient underwent right parathyroidectomy. Patient is doing well at home. Patient underwent laboratory studies which reveal a calcium of 10.5. Patient also had elevated alk phosphatase. Patient does state that she feels she less bone pain as well as more energy. She has not complained of any cramps at home. She does state she has some issues with sleeping.  On exam:  Was clean dry and intact.Her voice is normal.  Assessment and plan: The patient is a 67 year old female status post right parathyroidectomy. 1. The patient's calcium at this point is within normal limits. I would still like her to continue with her calcium and vitamin D supplementation, as well as magnesium. We'll check a vitamin D level. 2. Patient does state that herbone  pain isthat at this time. She would like to stay away from any RA medications and see if losing weight, and removing her parathyroid adenoma helps with her bone pain.  3. We'll have patient follow back up in one month with a calcium level and vitamin D3 level.

## 2013-08-01 LAB — VITAMIN D 1,25 DIHYDROXY: Vitamin D2 1, 25 (OH)2: 27 pg/mL

## 2013-08-05 ENCOUNTER — Telehealth (INDEPENDENT_AMBULATORY_CARE_PROVIDER_SITE_OTHER): Payer: Self-pay | Admitting: General Surgery

## 2013-08-05 NOTE — Telephone Encounter (Signed)
LMOM 10:36 for pt to call back to let her know Vit D levels were fine per AR...please see below

## 2013-08-05 NOTE — Telephone Encounter (Signed)
Message copied by June Leap on Wed Aug 05, 2013 10:38 AM ------      Message from: Axel Filler      Created: Tue Aug 04, 2013  2:56 PM       Can you call her and let her know that her Vit D level is fine.  If you can't get to it today, tomorrow is fine.            Thanks      AR      ----- Message -----         From: [facility]         Sent: 08/01/2013  11:47 PM           To: Axel Filler, MD                   ------

## 2013-08-05 NOTE — Telephone Encounter (Signed)
Pt returned call. Gave her below msg.

## 2013-08-21 LAB — CALCIUM: Calcium: 10.2 mg/dL (ref 8.4–10.5)

## 2013-08-24 ENCOUNTER — Encounter (INDEPENDENT_AMBULATORY_CARE_PROVIDER_SITE_OTHER): Payer: Medicare Other | Admitting: General Surgery

## 2013-08-24 ENCOUNTER — Ambulatory Visit (INDEPENDENT_AMBULATORY_CARE_PROVIDER_SITE_OTHER): Payer: Medicare Other | Admitting: General Surgery

## 2013-08-24 ENCOUNTER — Encounter (INDEPENDENT_AMBULATORY_CARE_PROVIDER_SITE_OTHER): Payer: Self-pay | Admitting: General Surgery

## 2013-08-24 VITALS — BP 130/78 | HR 76 | Temp 97.9°F | Resp 16 | Ht 67.0 in | Wt 226.8 lb

## 2013-08-24 DIAGNOSIS — Z9889 Other specified postprocedural states: Secondary | ICD-10-CM

## 2013-08-24 NOTE — Progress Notes (Signed)
Patient ID: Linda Atkinson, female   DOB: 1946/03/29, 67 y.o.   MRN: [id number] The patient is a 67 year old female status post parathyroid adenoma removal. Patient's continue with calcium and vitamin D 3 supplementation. She does state she continues with bone pain. She does feel that her energy has picked up since her surgery and being Normocalcemic.  Her calcium is within normal limits  On exam Wounds are clean dry and intact and well-healed  Assessment and plan: 67 her old female status post parathyroid adenoma removal 1. The patient elected to follow up with Dr. Merla Riches as needed to help follow her calciums and vitamin D3 2. The patient followup with me as needed.

## 2013-08-27 LAB — VITAMIN D 1,25 DIHYDROXY
Vitamin D 1, 25 (OH)2 Total: 147 pg/mL — ABNORMAL HIGH (ref 18–72)
Vitamin D2 1, 25 (OH)2: 16 pg/mL
Vitamin D3 1, 25 (OH)2: 131 pg/mL

## 2013-09-10 ENCOUNTER — Encounter (INDEPENDENT_AMBULATORY_CARE_PROVIDER_SITE_OTHER): Payer: Self-pay

## 2013-09-10 ENCOUNTER — Other Ambulatory Visit: Payer: Self-pay

## 2013-09-21 ENCOUNTER — Ambulatory Visit (INDEPENDENT_AMBULATORY_CARE_PROVIDER_SITE_OTHER): Payer: Medicare Other | Admitting: Family Medicine

## 2013-09-21 VITALS — BP 138/78 | HR 66 | Temp 98.8°F | Resp 18 | Ht 68.25 in | Wt 232.6 lb

## 2013-09-21 DIAGNOSIS — H9190 Unspecified hearing loss, unspecified ear: Secondary | ICD-10-CM

## 2013-09-21 DIAGNOSIS — H6120 Impacted cerumen, unspecified ear: Secondary | ICD-10-CM

## 2013-09-21 DIAGNOSIS — H9193 Unspecified hearing loss, bilateral: Secondary | ICD-10-CM

## 2013-09-21 DIAGNOSIS — H6123 Impacted cerumen, bilateral: Secondary | ICD-10-CM

## 2013-09-21 NOTE — Progress Notes (Signed)
Subjective:  67 year old lady who's been having problems with her hearing since Thursday. She had a little problem off and on before that. She cannot hear hardly anything. She does smoke about 4 cigarettes a day. She has not had major ear problems in the past except for having had wax washed out last time she saw Benny Lennert PA. That was about 2 years ago. Since that time she has not been in here but, but she has been to the doctors. She had to have a rotator cuff repair twice and was diagnosed with rheumatoid arthritis, then sent to a rheumatologist who sent her to a endocrinologist because calcium was high. She had a parathyroid adenoma and had to go to a surgeon to get that removed. It is been a rough 2 years but she is doing better on those issues.  Objective: Both TMs are tightly occluded with cerumen. Throat clear. Neck supple without significant nodes.  Assessment: Bilateral cerumen impaction Hearing loss secondary to cerumen  Plan: Irrigate cerumen in canals. Then check her ears again.  A large amount of wax was obtained from both ears. On reexamination both drums well visualized and looked normal. There is a tiny bit of tympanosclerosis on the right. She still feels a little muffled in her hearing. I do not think this represents a eustachian tube problems, but if she persists in feeling like she can't hear as well as she should, she can try taking a antihistamine decongestant to see if that helps open the eustachian tubes.

## 2013-09-21 NOTE — Patient Instructions (Signed)
You can use the Debrox every 3 or 4 months on a regular basis to keep your ears from plugging up with wax.  Return if further problems  If congested try taking Claritin-D or Allegra-D but be careful about her blood pressure.

## 2014-04-16 ENCOUNTER — Ambulatory Visit (INDEPENDENT_AMBULATORY_CARE_PROVIDER_SITE_OTHER): Payer: Medicare Other | Admitting: Family Medicine

## 2014-04-16 ENCOUNTER — Encounter: Payer: Self-pay | Admitting: Family Medicine

## 2014-04-16 VITALS — BP 148/86 | HR 74 | Temp 98.9°F | Resp 20 | Ht 67.5 in | Wt 263.0 lb

## 2014-04-16 DIAGNOSIS — N61 Mastitis without abscess: Secondary | ICD-10-CM

## 2014-04-16 DIAGNOSIS — N611 Abscess of the breast and nipple: Secondary | ICD-10-CM

## 2014-04-16 DIAGNOSIS — N189 Chronic kidney disease, unspecified: Secondary | ICD-10-CM

## 2014-04-16 DIAGNOSIS — R030 Elevated blood-pressure reading, without diagnosis of hypertension: Secondary | ICD-10-CM

## 2014-04-16 DIAGNOSIS — E1165 Type 2 diabetes mellitus with hyperglycemia: Secondary | ICD-10-CM

## 2014-04-16 DIAGNOSIS — F17200 Nicotine dependence, unspecified, uncomplicated: Secondary | ICD-10-CM

## 2014-04-16 DIAGNOSIS — E213 Hyperparathyroidism, unspecified: Secondary | ICD-10-CM

## 2014-04-16 DIAGNOSIS — Z720 Tobacco use: Secondary | ICD-10-CM

## 2014-04-16 DIAGNOSIS — E039 Hypothyroidism, unspecified: Secondary | ICD-10-CM

## 2014-04-16 DIAGNOSIS — IMO0001 Reserved for inherently not codable concepts without codable children: Secondary | ICD-10-CM

## 2014-04-16 HISTORY — DX: Chronic kidney disease, unspecified: N18.9

## 2014-04-16 MED ORDER — DOXYCYCLINE HYCLATE 100 MG PO CAPS: 100.0000 mg | ORAL_CAPSULE | Freq: Two times a day (BID) | ORAL | Status: DC

## 2014-04-16 MED ORDER — LEVOTHYROXINE SODIUM 175 MCG PO TABS: 175.0000 ug | ORAL_TABLET | Freq: Every day | ORAL | Status: DC

## 2014-04-16 NOTE — Patient Instructions (Addendum)
Please make a follow-up appointment in 2 months so we can recheck your thyroid levels BEFORE you run out of the medication.  We will order a ultrasound of your right breast this week to ensure that the infection is being appropriate treated but you need a mammogram after the infection clears but within a month to ensure that there is no breast cancer - especially the inflammatory type.  Your blood pressure is incredibly high today which I am hoping is due to pain and anxiety around your breast infection. However, please come back to clinic in 2-3 days so we can recheck it and start blood pressure medication if it is not better.  If you want to see a woman - Dr. Marin Comment will be in clinic on Sunday and a female PA will be available this weekend to recheck your infection.  Hypertension As your heart beats, it forces blood through your arteries. This force is your blood pressure. If the pressure is too high, it is called hypertension (HTN) or high blood pressure. HTN is dangerous because you may have it and not know it. High blood pressure may mean that your heart has to work harder to pump blood. Your arteries may be narrow or stiff. The extra work puts you at risk for heart disease, stroke, and other problems.  Blood pressure consists of two numbers, a higher number over a lower, 110/72, for example. It is stated as "110 over 72." The ideal is below 120 for the top number (systolic) and under 80 for the bottom (diastolic). Write down your blood pressure today. You should pay close attention to your blood pressure if you have certain conditions such as:  Heart failure.  Prior heart attack.  Diabetes  Chronic kidney disease.  Prior stroke.  Multiple risk factors for heart disease. To see if you have HTN, your blood pressure should be measured while you are seated with your arm held at the level of the heart. It should be measured at least twice. A one-time elevated blood pressure reading (especially in  the Emergency Department) does not mean that you need treatment. There may be conditions in which the blood pressure is different between your right and left arms. It is important to see your caregiver soon for a recheck. Most people have essential hypertension which means that there is not a specific cause. This type of high blood pressure may be lowered by changing lifestyle factors such as:  Stress.  Smoking.  Lack of exercise.  Excessive weight.  Drug/tobacco/alcohol use.  Eating less salt. Most people do not have symptoms from high blood pressure until it has caused damage to the body. Effective treatment can often prevent, delay or reduce that damage. TREATMENT  When a cause has been identified, treatment for high blood pressure is directed at the cause. There are a large number of medications to treat HTN. These fall into several categories, and your caregiver will help you select the medicines that are best for you. Medications may have side effects. You should review side effects with your caregiver. If your blood pressure stays high after you have made lifestyle changes or started on medicines,   Your medication(s) may need to be changed.  Other problems may need to be addressed.  Be certain you understand your prescriptions, and know how and when to take your medicine.  Be sure to follow up with your caregiver within the time frame advised (usually within two weeks) to have your blood pressure rechecked and  to review your medications.  If you are taking more than one medicine to lower your blood pressure, make sure you know how and at what times they should be taken. Taking two medicines at the same time can result in blood pressure that is too low. SEEK IMMEDIATE MEDICAL CARE IF:  You develop a severe headache, blurred or changing vision, or confusion.  You have unusual weakness or numbness, or a faint feeling.  You have severe chest or abdominal pain, vomiting, or  breathing problems. MAKE SURE YOU:   Understand these instructions.  Will watch your condition.  Will get help right away if you are not doing well or get worse. Document Released: 10/22/2005 Document Revised: 01/14/2012 Document Reviewed: 06/11/2008 [facility] Patient Information 2014 [place]. DASH Diet The DASH diet stands for "Dietary Approaches to Stop Hypertension." It is a healthy eating plan that has been shown to reduce high blood pressure (hypertension) in as little as 14 days, while also possibly providing other significant health benefits. These other health benefits include reducing the risk of breast cancer after menopause and reducing the risk of type 2 diabetes, heart disease, colon cancer, and stroke. Health benefits also include weight loss and slowing kidney failure in patients with chronic kidney disease.  DIET GUIDELINES  Limit salt (sodium). Your diet should contain less than 1500 mg of sodium daily.  Limit refined or processed carbohydrates. Your diet should include mostly whole grains. Desserts and added sugars should be used sparingly.  Include small amounts of heart-healthy fats. These types of fats include nuts, oils, and tub margarine. Limit saturated and trans fats. These fats have been shown to be harmful in the body. CHOOSING FOODS  The following food groups are based on a 2000 calorie diet. See your Registered Dietitian for individual calorie needs. Grains and Grain Products (6 to 8 servings daily)  Eat More Often: Whole-wheat bread, brown rice, whole-grain or wheat pasta, quinoa, popcorn without added fat or salt (air popped).  Eat Less Often: White bread, white pasta, white rice, cornbread. Vegetables (4 to 5 servings daily)  Eat More Often: Fresh, frozen, and canned vegetables. Vegetables may be raw, steamed, roasted, or grilled with a minimal amount of fat.  Eat Less Often/Avoid: Creamed or fried vegetables. Vegetables in a cheese sauce. Fruit  (4 to 5 servings daily)  Eat More Often: All fresh, canned (in natural juice), or frozen fruits. Dried fruits without added sugar. One hundred percent fruit juice ( cup [237 mL] daily).  Eat Less Often: Dried fruits with added sugar. Canned fruit in light or heavy syrup. [employer], Fish, and Poultry (2 servings or less daily. One serving is 3 to 4 oz [85-114 g]).  Eat More Often: Ninety percent or leaner ground beef, tenderloin, sirloin. Round cuts of beef, chicken breast, [place] breast. All fish. Grill, bake, or broil your meat. Nothing should be fried.  Eat Less Often/Avoid: Fatty cuts of meat, [place], or chicken leg, thigh, or wing. Fried cuts of meat or fish. Dairy (2 to 3 servings)  Eat More Often: Low-fat or fat-free milk, low-fat plain or light yogurt, reduced-fat or part-skim cheese.  Eat Less Often/Avoid: Milk (whole, 2%).Whole milk yogurt. Full-fat cheeses. Nuts, Seeds, and Legumes (4 to 5 servings per week)  Eat More Often: All without added salt.  Eat Less Often/Avoid: Salted nuts and seeds, canned beans with added salt. Fats and Sweets (limited)  Eat More Often: Vegetable oils, tub margarines without trans fats, sugar-free gelatin. Mayonnaise and salad  dressings.  Eat Less Often/Avoid: Coconut oils, palm oils, butter, stick margarine, cream, half and half, cookies, candy, pie. FOR MORE INFORMATION The Dash Diet Eating Plan: www.dashdiet.org Document Released: 10/11/2011 Document Revised: 01/14/2012 Document Reviewed: 10/11/2011 [facility] Patient Information 2014 [place], [place].

## 2014-04-16 NOTE — Progress Notes (Signed)
Patient ID: YAJAIRA DOFFING MRN: [id number], DOB: 06/10/1946, 68 y.o. Date of Encounter: 04/16/2014, 2:22 PM    PROCEDURE NOTE: Verbal consent obtained. Local anesthesia obtained with 2% lidocaine plain into both lesions.   1 cm incision made with 11 blade along inferior lesion. 0.5 cm incision made along superior lesion.   Culture taken. Copious purulence expressed. Scant amount of sebaceous material expressed.  Lesion explored revealing no loculations. Packed with 1/4 plain packing.  Dressed. Wound care instructions including precautions with patient. Patient tolerated the procedure well. Recheck in 48 hours.      Angela Nevin, PA-C 04/16/2014 2:22 PM

## 2014-04-16 NOTE — Progress Notes (Addendum)
Subjective:    Patient ID: Linda Atkinson, female    DOB: 02-Nov-1946, 68 y.o.   MRN: [id number] This chart was scribed for Shawnee Knapp, MD by Anastasia Pall, ED Scribe. This patient was seen in room 24 and the patient's care was started at 1:41 PM.  Chief Complaint  Patient presents with  . Thyroid Problem  . Medication Refill  . Breast Problem   HPI Linda Atkinson is a 68 y.o. female Pt had a parathyroidectomy for parathyroid adenoma 07/2013. She had fatigue and bone pain that was resolving after the surgery, but does need Calcium and Vitamin D followed regularly. She is taking Calcium and Vitamin D daily along with twice daily Magnesium and has not had Calcium and Vitamin D levels checked in over 1 year. Pt reports Dr. Dwyane Dee told her it was ok to f/u w/ her PCP for this.    Usually sees Dr. Laney Pastor but only wanted to see a woman today since she was having complaints about her breasts. Then prefers to see [facility] but she did not have any avail appts.  Pt started having more pain in right breast approximately 3 days ago. She has long standing h/o pustules around the nipples, however she has never needed I&D or antibiotics for these, stating they always resolve on their own. She did have several abscesses I&D as a child. She has not had a mammogram in many years.  She works as a Customer service manager so states she has heard about inflammatory breast cancer in malpractice cases so new to come get evaluated immediately.  Has been on Synthroid 175 mg. Endocrinologist Dr. Dwyane Dee told her to follow up with PCP for future management of hypothyroid. She ran out of medication one week prior.    Patient Active Problem List   Diagnosis Date Noted  . Arthralgia 06/29/2013  . Hyperparathyroidism 06/11/2013  . Unspecified hypothyroidism 06/11/2013   Past Medical History  Diagnosis Date  . Allergy   . Arthritis   . Thyroid disease   . Depression     moderate  . Anxiety     about surgery  . Shortness of  breath     with walking  . Pneumonia 2009    hx of  . GERD (gastroesophageal reflux disease) 1996    hx of  . 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 Rheumatoid arthritis   . Complication of anesthesia     Sept. 26 2013: "blood pressure dropped"    Past Surgical History  Procedure Laterality Date  . Right shoulder rotater cuff Right 2013    x2  . Dilation and curettage of uterus  1972  . Abdominal hysterectomy  1977  . Cyst removed Left 1962    knee  . Parathyroidectomy Right 07/13/2013    Procedure: RIGHT INFERIOR PARATHYROIDECTOMY;  Surgeon: Ralene Ok, MD;  Location: [facility];  Service: General;  Laterality: Right;   No Known Allergies Prior to Admission medications   Medication Sig Start Date End Date Taking? Authorizing Provider  calcium-vitamin D (OSCAL WITH D) 500-200 MG-UNIT per tablet Take 2 tablets by mouth 4 (four) times daily. 07/14/13   Ralene Ok, MD  ibuprofen (ADVIL,MOTRIN) 200 MG tablet Take 200 mg by mouth every 6 (six) hours as needed for pain.    Historical Provider, MD  levothyroxine (SYNTHROID, LEVOTHROID) 175 MCG tablet Take 175 mcg by mouth daily before breakfast.    Historical Provider, MD  magnesium oxide (  MAG-OX) 400 (241.3 MG) MG tablet Take 1 tablet (400 mg total) by mouth 2 (two) times daily. 07/14/13   Ralene Ok, MD  potassium chloride (K-DUR) 10 MEQ tablet  07/10/13   Historical Provider, MD    Review of Systems  Constitutional: Positive for activity change and fatigue. Negative for fever, chills and diaphoresis.  Musculoskeletal: Positive for myalgias. Negative for arthralgias, gait problem and joint swelling.  Skin: Positive for color change and rash. Negative for pallor and wound.       + for areas of tenderness over right breast.   Hematological: Negative for adenopathy. Does not bruise/bleed easily.  Psychiatric/Behavioral: Positive for sleep disturbance. Negative for dysphoric mood. The patient is nervous/anxious.    BP  170/84  Pulse 74  Temp(Src) 98.9 F (37.2 C) (Oral)  Resp 20  Ht 5' 7.5" (1.715 m)  Wt 263 lb (119.296 kg)  BMI 40.56 kg/m2  SpO2 97%     Objective:   Physical Exam  Nursing note and vitals reviewed. Constitutional: She is oriented to person, place, and time. She appears well-developed and well-nourished. No distress.  HENT:  Head: Normocephalic and atraumatic.  Eyes: Conjunctivae and EOM are normal.  Neck: Normal range of motion.  Cardiovascular: Normal rate.   Pulmonary/Chest: Effort normal. No respiratory distress. She exhibits tenderness.  Right breast with significant blanching erythema from 3:00 to 9:00 surrounding areola. Very tender to palpation. Mild warmth and induration with 2 areas of fluctuance. The medial one around 4:00 approximately 1 cm and left one from 7:00 to 8:00 approximately 3 cm.  Left breast with 1 pinpoint pustule on nipple.  Abdominal: Soft. Bowel sounds are normal. She exhibits no mass. There is no tenderness.  Musculoskeletal: Normal range of motion.  Neurological: She is alert and oriented to person, place, and time.  Skin: Skin is warm and dry.  Psychiatric: She has a normal mood and affect. Her behavior is normal.      Assessment & Plan:   Unspecified hypothyroidism - will get pt restart on her levothyroxine then RTC in 6-8 wks for TSH. Will defer TSH today since pt has been of med for past wk.  Hyperparathyroidism - Plan: Basic metabolic panel, PTH, Intact and Calcium, Magnesium, Vitamin D pnl(25-hydrxy+1,25-dihy)-bld  Breast abscess - Plan: Wound culture, US BREAST COMPLETE UNI RIGHT INC AXILLA, MM Digital Diagnostic Bilat - needs [place] within the week to ensure infection does not track deeper. THen sched diagnostic mammogram for 2-4 wks - will want infection to be completely resolved for this to aid in better imaging and less pain during procedure for pt.  Elevated blood pressure - likely due to pain and concern from breast abscess - recheck at  f/u OV in 2d.  Meds ordered this encounter  Medications  . levothyroxine (SYNTHROID, LEVOTHROID) 175 MCG tablet    Sig: Take 1 tablet (175 mcg total) by mouth daily before breakfast.    Dispense:  90 tablet    Refill:  0  . doxycycline (VIBRAMYCIN) 100 MG capsule    Sig: Take 1 capsule (100 mg total) by mouth 2 (two) times daily.    Dispense:  20 capsule    Refill:  0    I personally performed the services described in this documentation, which was scribed in my presence. The recorded information has been reviewed and considered, and addended by me as needed.  Delman Cheadle, MD MPH  ADDENDUM: Pt w/ new diagnosis of DM - start on meformin 500 qam and  refer to DM ed. Asked pt to RTC asap to get rx for meter, glucometer, lancets, discuss timing of check blood sugars, and consider increasing metformin.  Will also need optho eval, monofil, microalb urine, and fasting lipid panel. Should start on asa and cons acei.

## 2014-04-17 LAB — BASIC METABOLIC PANEL
BUN: 9 mg/dL (ref 6–23)
CO2: 27 mEq/L (ref 19–32)
CREATININE: 0.79 mg/dL (ref 0.50–1.10)
Calcium: 10.2 mg/dL (ref 8.4–10.5)
Chloride: 102 mEq/L (ref 96–112)
GLUCOSE: 153 mg/dL — AB (ref 70–99)
Potassium: 4.2 mEq/L (ref 3.5–5.3)
Sodium: 136 mEq/L (ref 135–145)

## 2014-04-17 LAB — MAGNESIUM: Magnesium: 1.9 mg/dL (ref 1.5–2.5)

## 2014-04-18 ENCOUNTER — Ambulatory Visit (INDEPENDENT_AMBULATORY_CARE_PROVIDER_SITE_OTHER): Payer: Medicare Other | Admitting: Physician Assistant

## 2014-04-18 VITALS — BP 128/74 | HR 62 | Temp 97.8°F | Resp 16

## 2014-04-18 DIAGNOSIS — N61 Mastitis without abscess: Secondary | ICD-10-CM

## 2014-04-18 DIAGNOSIS — Z720 Tobacco use: Secondary | ICD-10-CM | POA: Insufficient documentation

## 2014-04-18 DIAGNOSIS — N611 Abscess of the breast and nipple: Secondary | ICD-10-CM

## 2014-04-18 LAB — HEMOGLOBIN A1C
HEMOGLOBIN A1C: 8.2 % — AB (ref <5.7)
MEAN PLASMA GLUCOSE: 189 mg/dL — AB (ref <117)

## 2014-04-18 NOTE — Progress Notes (Signed)
Patient ID: Linda Atkinson MRN: [id number], DOB: [birth year] 68 y.o. Date of Encounter: 04/18/2014, 11:20 AM  Chief Complaint: Wound care   See previous note  HPI: 68 y.o. y/o female presents for wound care s/p I&D on 04/16/14. Doing well No issues or complaints Afebrile/ no chills No nausea or vomiting Tolerating doxycycline.  Pain improving - lower wound still slightly tender Daily dressing change Previous note reviewed  Past Medical History  Diagnosis Date  . Allergy   . Arthritis   . Thyroid disease   . Depression     moderate  . Anxiety     about surgery  . Shortness of breath     with walking  . Pneumonia 2009    hx of  . GERD (gastroesophageal reflux disease) 1996    hx of  . 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 Rheumatoid arthritis   . Complication of anesthesia     Sept. 26 2013: "blood pressure dropped"      Home Meds: Prior to Admission medications   Medication Sig Start Date End Date Taking? Authorizing Provider  calcium-vitamin D (OSCAL WITH D) 500-200 MG-UNIT per tablet Take 2 tablets by mouth 4 (four) times daily. 07/14/13  Yes Ralene Ok, MD  doxycycline (VIBRAMYCIN) 100 MG capsule Take 1 capsule (100 mg total) by mouth 2 (two) times daily. 04/16/14  Yes Shawnee Knapp, MD  ibuprofen (ADVIL,MOTRIN) 200 MG tablet Take 200 mg by mouth every 6 (six) hours as needed for pain.   Yes Historical Provider, MD  levothyroxine (SYNTHROID, LEVOTHROID) 175 MCG tablet Take 1 tablet (175 mcg total) by mouth daily before breakfast. 04/16/14  Yes Shawnee Knapp, MD  magnesium oxide (MAG-OX) 400 (241.3 MG) MG tablet Take 1 tablet (400 mg total) by mouth 2 (two) times daily. 07/14/13  Yes Ralene Ok, MD    Allergies: No Known Allergies  ROS: Constitutional: Afebrile, no chills Cardiovascular: negative for chest pain or palpitations Dermatological: Positive for wound. Negative for erythema, pain, or warmth. GI: No nausea or vomiting   EXAM: Physical  Exam: Blood pressure 128/74, pulse 62, temperature 97.8 F (36.6 C), resp. rate 16, SpO2 98.00%., There is no weight on file to calculate BMI. General: Well developed, well nourished, in no acute distress. Nontoxic appearing. Head: Normocephalic, atraumatic, sclera non-icteric.  Neck: Supple. Lungs: Breathing is unlabored. Heart: Normal rate. Skin:  Warm and moist. Dressing and packing in place. No induration, erythema, or tenderness to palpation. Neuro: Alert and oriented X 3. Moves all extremities spontaneously. Normal gait.  Psych:  Responds to questions appropriately with a normal affect.       PROCEDURE: Dressing and packing removed. No purulence expressed Wound bed healthy Irrigated with 1% plain lidocaine 5 cc. Repacked with small amount of 1/4 plain packing.  Dressing applied  LAB: Culture: in process  A/P: 68 y.o. y/o female with breast cellulitis/abscess as above s/p I&D on 04/15/14.  Wound care per above Continue doxycycline.  Pain well controlled Daily dressing changes Recheck 48 hours  Signed, Georgiann Mccoy, PA-C 04/18/2014 11:20 AM

## 2014-04-19 ENCOUNTER — Telehealth: Payer: Self-pay

## 2014-04-19 DIAGNOSIS — E119 Type 2 diabetes mellitus without complications: Secondary | ICD-10-CM | POA: Insufficient documentation

## 2014-04-19 LAB — VITAMIN D PNL(25-HYDRXY+1,25-DIHY)-BLD
Vit D, 25-Hydroxy: 20 ng/mL — ABNORMAL LOW (ref 30–89)
Vitamin D 1, 25 (OH)2 Total: 74 pg/mL — ABNORMAL HIGH (ref 18–72)
Vitamin D2 1, 25 (OH)2: 12 pg/mL
Vitamin D3 1, 25 (OH)2: 62 pg/mL

## 2014-04-19 LAB — PTH, INTACT AND CALCIUM
CALCIUM: 11 mg/dL — AB (ref 8.4–10.5)
PTH: 145.1 pg/mL — AB (ref 14.0–72.0)

## 2014-04-19 LAB — WOUND CULTURE: GRAM STAIN: NONE SEEN

## 2014-04-19 MED ORDER — METFORMIN HCL 500 MG PO TABS: 500.0000 mg | ORAL_TABLET | Freq: Every day | ORAL | Status: DC

## 2014-04-19 NOTE — Addendum Note (Signed)
Addended by: Delman Cheadle on: 04/19/2014 10:12 AM   Modules accepted: Orders

## 2014-04-19 NOTE — Telephone Encounter (Signed)
Scheduled Mrs. Basley for Ultrasound and mammogram at the breast center for 06/22 7:30am. Patient called back and wants to know if Dr. Brigitte Pulse still thinks her breast will be too tender to go through both test at the same day. Patient thought that she and Dr. Brigitte Pulse discussed during office visit  to do the ultrasound first. Please clarify, patient needs to know. The breast center pulled both orders and scheduled both at the same time. Please advise. Thank you.

## 2014-04-20 ENCOUNTER — Other Ambulatory Visit: Payer: Self-pay | Admitting: Family Medicine

## 2014-04-20 ENCOUNTER — Ambulatory Visit (INDEPENDENT_AMBULATORY_CARE_PROVIDER_SITE_OTHER): Payer: Medicare Other | Admitting: Physician Assistant

## 2014-04-20 ENCOUNTER — Other Ambulatory Visit: Payer: Self-pay

## 2014-04-20 VITALS — BP 140/80 | HR 70 | Temp 98.3°F | Resp 16

## 2014-04-20 DIAGNOSIS — N611 Abscess of the breast and nipple: Secondary | ICD-10-CM

## 2014-04-20 DIAGNOSIS — N61 Mastitis without abscess: Secondary | ICD-10-CM

## 2014-04-20 NOTE — Progress Notes (Signed)
Patient ID: Linda Atkinson MRN: [id number], DOB: [birth year] 68 y.o. Date of Encounter: 04/20/2014, 5:13 PM  Chief Complaint: Wound care   See previous note  HPI: 68 y.o. y/o female presents for wound care s/p I&D on 04/16/14. Doing well No issues or complaints Afebrile/ no chills No nausea or vomiting Tolerating doxycycline.  Pain improved.  Daily dressing change Previous note reviewed  Past Medical History  Diagnosis Date  . Allergy   . Arthritis   . Thyroid disease   . Depression     moderate  . Anxiety     about surgery  . Shortness of breath     with walking  . Pneumonia 2009    hx of  . GERD (gastroesophageal reflux disease) 1996    hx of  . 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 Rheumatoid arthritis   . Complication of anesthesia     Sept. 26 2013: "blood pressure dropped"      Home Meds: Prior to Admission medications   Medication Sig Start Date End Date Taking? Authorizing Provider  calcium-vitamin D (OSCAL WITH D) 500-200 MG-UNIT per tablet Take 2 tablets by mouth 4 (four) times daily. 07/14/13  Yes Ralene Ok, MD  doxycycline (VIBRAMYCIN) 100 MG capsule Take 1 capsule (100 mg total) by mouth 2 (two) times daily. 04/16/14  Yes Shawnee Knapp, MD  ibuprofen (ADVIL,MOTRIN) 200 MG tablet Take 200 mg by mouth every 6 (six) hours as needed for pain.   Yes Historical Provider, MD  levothyroxine (SYNTHROID, LEVOTHROID) 175 MCG tablet Take 1 tablet (175 mcg total) by mouth daily before breakfast. 04/16/14  Yes Shawnee Knapp, MD  magnesium oxide (MAG-OX) 400 (241.3 MG) MG tablet Take 1 tablet (400 mg total) by mouth 2 (two) times daily. 07/14/13  Yes Ralene Ok, MD  metFORMIN (GLUCOPHAGE) 500 MG tablet Take 1 tablet (500 mg total) by mouth daily with breakfast. 04/19/14  Yes Shawnee Knapp, MD    Allergies: No Known Allergies  ROS: Constitutional: Afebrile, no chills Cardiovascular: negative for chest pain or palpitations Dermatological: Positive for wound.  Negative for erythema, pain, or warmth.  GI: No nausea or vomiting   EXAM: Physical Exam: Blood pressure 140/80, pulse 70, temperature 98.3 F (36.8 C), resp. rate 16, SpO2 98.00%., There is no weight on file to calculate BMI. General: Well developed, well nourished, in no acute distress. Nontoxic appearing. Head: Normocephalic, atraumatic, sclera non-icteric.  Neck: Supple. Lungs: Breathing is unlabored. Heart: Normal rate. Skin:  Warm and moist. Dressing and packing in place. No induration, erythema, or tenderness to palpation. Neuro: Alert and oriented X 3. Moves all extremities spontaneously. Normal gait.  Psych:  Responds to questions appropriately with a normal affect.       PROCEDURE: Dressing and packing removed. No purulence expressed Wound bed healthy Irrigated with 1% plain lidocaine 5 cc. Not repacked. Dressing applied  LAB: Culture: no specific organsism  A/P: 68 y.o. y/o female with breast cellulitis/abscess as above s/p I&D on 04/16/14.  Wound care per above Continue doxycycline Pain well controlled Daily dressing changes Recheck as needed. Mammogram and breast US scheduled for 6/22  SignedGeorgiann Mccoy, [place] 04/20/2014 5:13 PM

## 2014-04-22 NOTE — Telephone Encounter (Signed)
Ok to do both at same time. Pt has improved quickly

## 2014-04-26 ENCOUNTER — Ambulatory Visit
Admission: RE | Admit: 2014-04-26 | Discharge: 2014-04-26 | Disposition: A | Discharge status: Home / Self Care | Payer: Medicare Other | Source: Ambulatory Visit | Attending: Family Medicine | Admitting: Family Medicine

## 2014-04-26 DIAGNOSIS — N611 Abscess of the breast and nipple: Secondary | ICD-10-CM

## 2014-06-17 ENCOUNTER — Encounter: Payer: Self-pay | Admitting: Family Medicine

## 2014-06-17 ENCOUNTER — Ambulatory Visit (INDEPENDENT_AMBULATORY_CARE_PROVIDER_SITE_OTHER): Payer: Medicare Other | Admitting: Family Medicine

## 2014-06-17 VITALS — BP 126/78 | HR 61 | Temp 98.7°F | Resp 16 | Ht 66.5 in | Wt 254.6 lb

## 2014-06-17 DIAGNOSIS — E039 Hypothyroidism, unspecified: Secondary | ICD-10-CM

## 2014-06-17 DIAGNOSIS — N611 Abscess of the breast and nipple: Secondary | ICD-10-CM

## 2014-06-17 DIAGNOSIS — N61 Mastitis without abscess: Secondary | ICD-10-CM

## 2014-06-17 DIAGNOSIS — IMO0001 Reserved for inherently not codable concepts without codable children: Secondary | ICD-10-CM

## 2014-06-17 DIAGNOSIS — E1165 Type 2 diabetes mellitus with hyperglycemia: Principal | ICD-10-CM

## 2014-06-17 DIAGNOSIS — E349 Endocrine disorder, unspecified: Secondary | ICD-10-CM

## 2014-06-17 LAB — POCT GLYCOSYLATED HEMOGLOBIN (HGB A1C): HEMOGLOBIN A1C: 7.5

## 2014-06-17 MED ORDER — METFORMIN HCL 500 MG PO TABS: ORAL_TABLET | ORAL | Status: DC

## 2014-06-17 MED ORDER — GLUCOSE BLOOD VI STRP: ORAL_STRIP | Status: DC

## 2014-06-17 MED ORDER — LANCETS MISC. MISC: Status: DC

## 2014-06-17 MED ORDER — CEFUROXIME AXETIL 500 MG PO TABS: 500.0000 mg | ORAL_TABLET | Freq: Two times a day (BID) | ORAL | Status: DC

## 2014-06-17 MED ORDER — BLOOD GLUCOSE MONITOR KIT: PACK | Status: DC

## 2014-06-17 NOTE — Patient Instructions (Signed)
You have been referred for Diabetes education. Glucometer with supplies have been ordered for you.  I have placed a referral to Dr. Dwyane Dee. I expect you to take your medications as directed and to be more active. Diabetes control requires good nutrition, regular physical activity and medication compliance - lifestyle changes are important.       Mediterranean Diet  Why follow it? Research shows.   Those who follow the Mediterranean diet have a reduced risk of heart disease    The diet is associated with a reduced incidence of Parkinson's and Alzheimer's diseases   People following the diet may have longer life expectancies and lower rates of chronic diseases    The Dietary Guidelines for Americans recommends the Mediterranean diet as an eating plan to promote health and prevent disease  What Is the Mediterranean Diet?    Healthy eating plan based on typical foods and recipes of Mediterranean-style cooking   The diet is primarily a plant based diet; these foods should make up a majority of meals   Starches - Plant based foods should make up a majority of meals - They are an important sources of vitamins, minerals, energy, antioxidants, and fiber - Choose whole grains, foods high in fiber and minimally processed items  - Typical grain sources include wheat, oats, barley, corn, brown rice, bulgar, farro, millet, polenta, couscous  - Various types of beans include chickpeas, lentils, fava beans, black beans, white beans   Fruits  Veggies - Large quantities of antioxidant rich fruits & veggies; 6 or more servings  - Vegetables can be eaten raw or lightly drizzled with oil and cooked  - Vegetables common to the traditional Mediterranean Diet include: artichokes, arugula, beets, broccoli, brussel sprouts, cabbage, carrots, celery, collard greens, cucumbers, eggplant, kale, leeks, lemons, lettuce, mushrooms, okra, onions, peas, peppers, potatoes, pumpkin, radishes, rutabaga, shallots, spinach, sweet  potatoes, turnips, zucchini - Fruits common to the Mediterranean Diet include: apples, apricots, avocados, cherries, clementines, dates, figs, grapefruits, grapes, melons, nectarines, oranges, peaches, pears, pomegranates, strawberries, tangerines  Fats - Replace butter and margarine with healthy oils, such as olive oil, canola oil, and tahini  - Limit nuts to no more than a handful a day  - Nuts include walnuts, almonds, pecans, pistachios, pine nuts  - Limit or avoid candied, honey roasted or heavily salted nuts - Olives are central to the [employer] - can be eaten whole or used in a variety of dishes   Meats Protein - Limiting red meat: no more than a few times a month - When eating red meat: choose lean cuts and keep the portion to the size of deck of cards - Eggs: approx. 0 to 4 times a week  - Fish and lean poultry: at least 2 a week  - Healthy protein sources include, chicken, [place], lean beef, lamb - Increase intake of seafood such as tuna, salmon, trout, mackerel, shrimp, scallops - Avoid or limit high fat processed meats such as sausage and bacon  Dairy - Include moderate amounts of low fat dairy products  - Focus on healthy dairy such as fat free yogurt, skim milk, low or reduced fat cheese - Limit dairy products higher in fat such as whole or 2% milk, cheese, ice cream  Alcohol - Moderate amounts of red wine is ok  - No more than 5 oz daily for women (all ages) and men older than age 48  - No more than 10 oz of wine daily for men younger than  65  Other - Limit sweets and other desserts  - Use herbs and spices instead of salt to flavor foods  - Herbs and spices common to the traditional Mediterranean Diet include: basil, bay leaves, chives, cloves, cumin, fennel, garlic, lavender, marjoram, mint, oregano, parsley, pepper, rosemary, sage, savory, sumac, tarragon, thyme   It's not just a diet, it's a lifestyle:    The Mediterranean diet includes lifestyle factors typical of  those in the region    Foods, drinks and meals are best eaten with others and savored   Daily physical activity is important for overall good health   This could be strenuous exercise like running and aerobics   This could also be more leisurely activities such as walking, housework, yard-work, or taking the stairs   Moderation is the key; a balanced and healthy diet accommodates most foods and drinks   Consider portion sizes and frequency of consumption of certain foods   Meal Ideas & Options:    Breakfast:  o Whole wheat toast or whole wheat English muffins with peanut butter & hard boiled egg o Steel cut oats topped with apples & cinnamon and skim milk  o Fresh fruit: banana, strawberries, melon, berries, peaches  o Smoothies: strawberries, bananas, greek yogurt, peanut butter o Low fat greek yogurt with blueberries and granola  o Egg white omelet with spinach and mushrooms o Breakfast couscous: whole wheat couscous, apricots, skim milk, cranberries    Sandwiches:  o Hummus and grilled vegetables (peppers, zucchini, squash) on whole wheat bread   o Grilled chicken on whole wheat pita with lettuce, tomatoes, cucumbers or tzatziki  o Tuna salad on whole wheat bread: tuna salad made with greek yogurt, olives, red peppers, capers, green onions o Garlic rosemary lamb pita: lamb sauted with garlic, rosemary, salt & pepper; add lettuce, cucumber, greek yogurt to pita - flavor with lemon juice and black pepper    Seafood:  o Mediterranean grilled salmon, seasoned with garlic, basil, parsley, lemon juice and black pepper o Shrimp, lemon, and spinach whole-grain pasta salad made with low fat greek yogurt  o Seared scallops with lemon orzo  o Seared tuna steaks seasoned salt, pepper, coriander topped with tomato mixture of olives, tomatoes, olive oil, minced garlic, parsley, green onions and cappers    Meats:  o Herbed greek chicken salad with kalamata olives, cucumber, feta  o Red bell peppers  stuffed with spinach, bulgur, lean ground beef (or lentils) & topped with feta   o Kebabs: skewers of chicken, tomatoes, onions, zucchini, squash  o [place] burgers: made with red onions, mint, dill, lemon juice, feta cheese topped with roasted red peppers   Vegetarian o Cucumber salad: cucumbers, artichoke hearts, celery, red onion, feta cheese, tossed in olive oil & lemon juice  o Hummus and whole grain pita points with a greek salad (lettuce, tomato, feta, olives, cucumbers, red onion) o Lentil soup with celery, carrots made with vegetable broth, garlic, salt and pepper  o Tabouli salad: parsley, bulgur, mint, scallions, cucumbers, tomato, radishes, lemon juice, olive oil, salt and pepper. o

## 2014-06-17 NOTE — Progress Notes (Signed)
Subjective:    Patient ID: Linda Atkinson, female    DOB: 1946/10/17, 68 y.o.   MRN: [id number]  HPI  This 67 y.o. Cauc female has Type II DM; Metformin prescribed at last visit in June. Pt did not start medication, being resistant to accept that she has DM. She has modified her diet and is more open to education and medication. Her father died in Jan 7824 with complications associated w/ DM and CAD. Pt does not have glucometer or supplies to check FSBS.  Pt has hx of parathyroid gland removal in 2014 ; she has abnormal PTH and calcium levels. Pt would like to be referred to Dr. Dwyane Dee for evaluation of this issue. She also has thyroid disorder; she is compliant w/ Levothyroxine and requests blood work to assess adequacy of medication.  Pt has had recurrent breast abscess (nipple on R side then nipple on L side). Lesion was lanced in June but has not completely healed. MMG was negative for malignancy.  Patient Active Problem List   Diagnosis Date Noted  . Type II or unspecified type diabetes mellitus without mention of complication, uncontrolled 04/19/2014  . Tobacco abuse 04/18/2014  . Arthralgia 06/29/2013  . Hyperparathyroidism 06/11/2013  . Unspecified hypothyroidism 06/11/2013    Prior to Admission medications   Medication Sig Start Date End Date Taking? Authorizing Provider  calcium-vitamin D (OSCAL WITH D) 500-200 MG-UNIT per tablet Take 2 tablets by mouth 4 (four) times daily. 07/14/13  Yes Ralene Ok, MD  ibuprofen (ADVIL,MOTRIN) 200 MG tablet Take 200 mg by mouth every 6 (six) hours as needed for pain.   Yes Historical Provider, MD  levothyroxine (SYNTHROID, LEVOTHROID) 175 MCG tablet Take 1 tablet (175 mcg total) by mouth daily before breakfast. 04/16/14  Yes Shawnee Knapp, MD  magnesium oxide (MAG-OX) 400 (241.3 MG) MG tablet Take 1 tablet (400 mg total) by mouth 2 (two) times daily. 07/14/13  Yes Ralene Ok, MD  metFORMIN (GLUCOPHAGE) 500 MG tablet Take 1 tablet once daily  with breakfast.        Allergies  Allergen Reactions  . Lipitor [Atorvastatin] Other (See Comments)    Muscle pain.    Past Surgical History  Procedure Laterality Date  . Right shoulder rotater cuff Right 2013    x2  . Dilation and curettage of uterus  1972  . Abdominal hysterectomy  1977  . Cyst removed Left 1962    knee  . Parathyroidectomy Right 07/13/2013    Procedure: RIGHT INFERIOR PARATHYROIDECTOMY;  Surgeon: Ralene Ok, MD;  Location: [facility];  Service: General;  Laterality: Right;    Family History  Problem Relation Age of Onset  . Cancer Brother     prostate  . Cancer Maternal Aunt     breast  . Cancer Paternal Aunt     uterine  . Cancer Paternal Grandmother     colon  . Heart disease Father   . Diabetes Father   . Hyperlipidemia Father   . Kidney disease Father   . Alzheimer's disease Mother    History   Social History  . Marital Status: Widowed    Spouse Name: N/A    Number of Children: N/A  . Years of Education: N/A   Occupational History  . Not on file.   Social History Main Topics  . Smoking status: Current Every Day Smoker -- 0.25 packs/day for 10 years    Types: Cigarettes  . Smokeless tobacco: Never Used  . Alcohol Use: No  .  Drug Use: No  . Sexual Activity: Not on file   Other Topics Concern  . Not on file   Social History Narrative  . No narrative on file    Review of Systems  Constitutional: Positive for fatigue.  HENT: Negative.   Eyes: Negative.   Respiratory: Negative.   Cardiovascular: Negative.   Endocrine: Negative.   Musculoskeletal: Negative.   Neurological: Negative.   Psychiatric/Behavioral: Negative.       Objective:   Physical Exam  Nursing note and vitals reviewed. Constitutional: She is oriented to person, place, and time. She appears well-developed and well-nourished. No distress.  HENT:  Head: Normocephalic and atraumatic.  Right Ear: External ear normal.  Left Ear: External ear normal.  Nose:  Nose normal.  Mouth/Throat: Oropharynx is clear and moist.  Eyes: Conjunctivae and EOM are normal. Pupils are equal, round, and reactive to light. No scleral icterus.  Neck: Normal range of motion. Neck supple. No thyromegaly present.  Cardiovascular: Normal rate.   Pulmonary/Chest: Effort normal. No respiratory distress.  Musculoskeletal: She exhibits no edema.  Lymphadenopathy:    She has no cervical adenopathy.  Neurological: She is alert and oriented to person, place, and time. No cranial nerve deficit. She exhibits normal muscle tone. Coordination normal.  Skin: Skin is warm and dry. She is not diaphoretic. No erythema. No pallor.  Psychiatric: She has a normal mood and affect. Her behavior is normal.    Results for orders placed in visit on 06/17/14  POCT GLYCOSYLATED HEMOGLOBIN (HGB A1C)      Result Value Ref Range   Hemoglobin A1C 7.5         Assessment & Plan:  Type II or unspecified type diabetes mellitus without mention of complication, uncontrolled - Plan: metFORMIN (GLUCOPHAGE) 500 MG tablet, Lipid panel, POCT glycosylated hemoglobin (Hb A1C), Ambulatory referral to diabetic education  Unspecified hypothyroidism - Plan: Thyroid Panel With TSH  Elevated parathyroid hormone - Plan: Thyroid Panel With TSH, Ambulatory referral to diabetic education, Ambulatory referral to Endocrinology  Abscess of left breast- Wound care; RX: Cefuroxime   Meds ordered this encounter  Medications  . metFORMIN (GLUCOPHAGE) 500 MG tablet    Sig: Take 1 tablet once daily with lunch.    Dispense:  90 tablet    Refill:  1  . Blood Glucose Monitoring Suppl (BLOOD GLUCOSE METER KIT AND SUPPLIES) KIT    Sig: One touch ultra/ use once daily to check blood sugar. (FOR ICD-9 250.00,    Dispense:  1 each    Refill:  0    Order Specific Question:  Number of strips    Answer:  100    Order Specific Question:  Number of lancets    Answer:  100  . Lancets Misc. MISC    Sig: One touch ultra/ to  check blood sugar once daily dx code 250.00    Dispense:  100 each    Refill:  2  . glucose blood test strip    Sig: Use once daily to check blood sugar dx code 250.00    Dispense:  100 each    Refill:  2  . cefUROXime (CEFTIN) 500 MG tablet    Sig: Take 1 tablet (500 mg total) by mouth 2 (two) times daily with a meal.    Dispense:  14 tablet    Refill:  0

## 2014-06-18 ENCOUNTER — Ambulatory Visit: Payer: Medicare Other | Admitting: Family Medicine

## 2014-06-18 LAB — LIPID PANEL
CHOL/HDL RATIO: 5.3 ratio
Cholesterol: 196 mg/dL (ref 0–200)
HDL: 37 mg/dL — ABNORMAL LOW (ref >39)
LDL CALC: 131 mg/dL — AB (ref 0–99)
Triglycerides: 139 mg/dL (ref <150)
VLDL: 28 mg/dL (ref 0–40)

## 2014-06-18 LAB — THYROID PANEL WITH TSH
FREE THYROXINE INDEX: 5.9 — AB (ref 1.0–3.9)
T3 Uptake: 34.1 % (ref 22.5–37.0)
T4 TOTAL: 17.3 ug/dL — AB (ref 5.0–12.5)
TSH: 0.353 u[IU]/mL (ref 0.350–4.500)

## 2014-06-23 ENCOUNTER — Telehealth: Payer: Self-pay | Admitting: Radiology

## 2014-06-23 MED ORDER — LANCETS MISC. MISC: Status: DC

## 2014-06-23 MED ORDER — BLOOD GLUCOSE MONITOR KIT: PACK | Status: DC

## 2014-06-23 MED ORDER — GLUCOSE BLOOD VI STRP: ORAL_STRIP | Status: DC

## 2014-06-23 NOTE — Telephone Encounter (Signed)
Resent the test strips lancets and glucose meter to [employer], there will be an additional form to be filled out for [employer], Dr Tamala Julian signed the diabetes supplies, so she will sign this form also.

## 2014-06-30 ENCOUNTER — Ambulatory Visit: Payer: Medicare Other | Admitting: Endocrinology

## 2014-07-14 ENCOUNTER — Encounter: Payer: Self-pay | Admitting: Endocrinology

## 2014-07-14 ENCOUNTER — Ambulatory Visit (INDEPENDENT_AMBULATORY_CARE_PROVIDER_SITE_OTHER): Payer: Medicare Other | Admitting: Endocrinology

## 2014-07-14 VITALS — BP 128/68 | HR 80 | Temp 98.2°F | Resp 16 | Ht 66.0 in | Wt 252.6 lb

## 2014-07-14 DIAGNOSIS — E213 Hyperparathyroidism, unspecified: Secondary | ICD-10-CM

## 2014-07-14 DIAGNOSIS — IMO0001 Reserved for inherently not codable concepts without codable children: Secondary | ICD-10-CM

## 2014-07-14 DIAGNOSIS — E039 Hypothyroidism, unspecified: Secondary | ICD-10-CM

## 2014-07-14 DIAGNOSIS — E1165 Type 2 diabetes mellitus with hyperglycemia: Secondary | ICD-10-CM

## 2014-07-14 LAB — BASIC METABOLIC PANEL
BUN: 10 mg/dL (ref 6–23)
CO2: 26 mEq/L (ref 19–32)
CREATININE: 0.9 mg/dL (ref 0.4–1.2)
Calcium: 10.4 mg/dL (ref 8.4–10.5)
Chloride: 102 mEq/L (ref 96–112)
GFR: 68.07 mL/min (ref >60.00)
Glucose, Bld: 202 mg/dL — ABNORMAL HIGH (ref 70–99)
Potassium: 3.6 mEq/L (ref 3.5–5.1)
Sodium: 136 mEq/L (ref 135–145)

## 2014-07-14 MED ORDER — SITAGLIPTIN PHOSPHATE 100 MG PO TABS: 100.0000 mg | ORAL_TABLET | Freq: Every day | ORAL | Status: DC

## 2014-07-14 NOTE — Progress Notes (Signed)
Patient ID: Linda Atkinson, female   DOB: 1946/09/06, 68 y.o.   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 She is usually taking 4 tablets a day with calcium and 200 units of vitamin D  CALCIUM levels: on 06/08/13 prior to surgery calcium was 14.5  Lab Results  Component Value Date   CALCIUM 10.2 04/16/2014   CALCIUM 11.0* 04/16/2014   CALCIUM 10.2 08/20/2013   CALCIUM 10.3 07/24/2013   CALCIUM 10.5 07/24/2013   Pathology report: HYPERCELLULAR PARATHYROID TISSUE WITH FOCAL CALCIFICATION AND FIBROSIS, CONSISTENT WITH PARATHYROID ADENOMA, 19.5 GRAMS.   2. HYPOTHYROIDISM:  She has had primary hypothyroidism for about 20 years and this is generally followed by her primary care physician. She was told recently that her thyroid levels are abnormal and she is asking for a second opinion She is quite compliant with taking her thyroid supplement, now taking 175 mcg. This had been reduced and 2014 when her TSH in June was 0.26 No unusual fatigue at this time. She tends to get hot easily  Lab Results  Component Value Date   TSH 0.353 06/17/2014    Her free thyroxine index is high, done from [place] lab; she is not on any estrogen products or supplements   3.  DIABETES: See review of systems   Past Medical History  Diagnosis Date  . Allergy   . Arthritis   . Thyroid disease   . Depression     moderate  . Anxiety     about surgery  . Shortness of breath     with walking  . Pneumonia 2009    hx of  . GERD (gastroesophageal reflux disease) 1996    hx of  . 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 Rheumatoid arthritis   . Complication of anesthesia     Sept. 26 2013: "blood pressure dropped"     Past Surgical History  Procedure Laterality Date  . Right shoulder rotater cuff Right 2013    x2  . Dilation and curettage of uterus  1972  . Abdominal hysterectomy  1977  . Cyst removed Left 1962    knee  . Parathyroidectomy Right 07/13/2013    Procedure: RIGHT INFERIOR PARATHYROIDECTOMY;  Surgeon: Ralene Ok, MD;  Location: [facility];  Service: General;  Laterality: Right;    Family History  Problem Relation Age of Onset  . Cancer Brother     prostate  . Cancer Maternal Aunt     breast  . Cancer Paternal Aunt     uterine  . Cancer Paternal Grandmother     colon  . Heart disease Father   . Diabetes Father   . Hyperlipidemia Father   . Kidney disease Father   .  Alzheimer's disease Mother     Social History:  reports that she has been smoking Cigarettes.  She has a 2.5 pack-year smoking history. She has never used smokeless tobacco. She reports that she does not drink alcohol or use illicit drugs.  Allergies:  Allergies  Allergen Reactions  . Lipitor [Atorvastatin] Other (See Comments)    Muscle pain.      Medication List       This list is accurate as of: 07/14/14  3:47 PM.  Always use your most recent med list.               blood glucose meter kit and supplies Kit  One touch ultra 2/ use once daily to check blood sugar. (FOR ICD-9 250.00,     calcium-vitamin D 500-200 MG-UNIT per tablet  Commonly known as:  OSCAL WITH D  Take 2 tablets by mouth 4 (four) times daily.     glucose blood test strip   Use once daily to check blood sugar dx code 250.00     ibuprofen 200 MG tablet  Commonly known as:  ADVIL,MOTRIN  Take 200 mg by mouth every 6 (six) hours as needed for pain.     Lancets Misc. Misc  One touch delica/ to check blood sugar once daily dx code 250.00     levothyroxine 175 MCG tablet  Commonly known as:  SYNTHROID, LEVOTHROID  Take 1 tablet (175 mcg total) by mouth daily before breakfast.     magnesium oxide 400 (241.3 MG) MG tablet  Commonly known as:  MAG-OX  Take 1 tablet (400 mg total) by mouth 2 (two) times daily.     metFORMIN 500 MG tablet  Commonly known as:  GLUCOPHAGE  Take 1 tablet once daily with lunch.          REVIEW OF SYSTEMS:        She has recently been told to have diabetes. She had ? Fasting glucose of 153 in June and an A1c was significantly high at 8.2% On followup in 8/15 her A1c was still high She was started on metformin 500 mg daily but she is having diarrhea with this Has not seen a dietitian She was started on home glucose monitoring but is doing the sporadically No history of numbness or tingling in her feet  Sugars at home: 139-218 checking mostly in the morning and she thinks her average sugar is about 150 She is concerned about the diagnosis of diabetes and the treatment options   Lab Results  Component Value Date   HGBA1C 7.5 06/17/2014   HGBA1C 8.2* 04/16/2014   Lab Results  Component Value Date   LDLCALC 131* 06/17/2014   CREATININE 0.79 04/16/2014    HYPERLIPIDEMIA: She has hypercholesterolemia and low HDL, currently not on any medications  Lab Results  Component Value Date   CHOL 196 06/17/2014   HDL 37* 06/17/2014   LDLCALC 131* 06/17/2014   TRIG 139 06/17/2014   CHOLHDL 5.3 06/17/2014     PHYSICAL EXAM:  BP 128/68  Pulse 80  Temp(Src) 98.2 F (36.8 C)  Resp 16  Ht 5\' 6"  (1.676 m)  Wt 252 lb 9.6 oz (114.579 kg)  BMI 40.79 kg/m2  SpO2 97%   Thyroid nonpalpable    ASSESSMENT:     Hyperparathyroidism, surgically treated. Although her calcium level continues to be high normal she does take 2000 mg of calcium orally with vitamin D She has only one high calcium level although it was normal  with the same lab on the same day Her PTH level was high and needs to be rechecked from a different lab Currently since she is asymptomatic and her calcium is stable do not think she needs surgery even if she has parathyroid hyperplasia or another adenoma  VITAMIN D deficiency: this needs to be reassessed  DIABETES: She clearly has diabetes with a high A1c and fasting glucose of 153 on the lab and abnormal readings at home also She is intolerant to 500 mg of metformin and does need a more effective dosage  Hypothyroidism: Reassured her that since her TSH is normal she does not need to change her medication and most likely the total T4 level is inaccurate  PLAN:   DIABETES: She also does need to check blood sugars more consistently including after meals which he is not doing Would recommend that she have nutritional counseling and regular exercise program She will be given a trial of Januvia 100 mg daily instead of metformin which she cannot tolerate  Labs to be checked for hypercalcemia, vitamin D today and she will need followup thyroid levels on the next visit from a different lab  Total visit time = 30 minutes  Trinka Keshishyan 07/14/2014, 3:47 PM   Addendum: PTH normal, calcium also normal Glucose 202 Vitamin D level mildly decreased, she can stop her calcium supplements and use OTC 2000 units vitamin D 3   Office Visit on 07/14/2014  Component Date Value Ref Range Status  . PTH 07/14/2014 50  15 - 65 pg/mL Final  . Sodium 07/14/2014 136  135 - 145 mEq/L Final  . Potassium 07/14/2014 3.6  3.5 - 5.1 mEq/L Final  . Chloride 07/14/2014 102  96 - 112 mEq/L Final  . CO2 07/14/2014 26  19 - 32 mEq/L Final  . Glucose, Bld 07/14/2014 202* 70 - 99 mg/dL Final  . BUN 07/14/2014 10  6  - 23 mg/dL Final  . Creatinine, Ser 07/14/2014 0.9  0.4 - 1.2 mg/dL Final  . Calcium 07/14/2014 10.4  8.4 - 10.5 mg/dL Final  . GFR 07/14/2014 68.07  >60.00 mL/min Final  . VITD 07/14/2014 19.55* 30.00 - 100.00 ng/mL Final

## 2014-07-14 NOTE — Patient Instructions (Addendum)
Stop Metformin And Calcium  Please check blood sugars at least half the time about 2 hours after any meal and 2 times per week on waking up. Please bring blood sugar monitor to each visit  Walk or other exercise daily

## 2014-07-15 LAB — VITAMIN D 25 HYDROXY (VIT D DEFICIENCY, FRACTURES): VITD: 19.55 ng/mL — AB (ref 30.00–100.00)

## 2014-07-15 LAB — PARATHYROID HORMONE, INTACT (NO CA): PTH: 50 pg/mL (ref 15–65)

## 2014-07-15 NOTE — Progress Notes (Signed)
Quick Note:  Calcium is normal at 10.4 and PTH level is normal. Vitamin D level is low: Stop taking calcium supplements and start taking vitamin D 3 OTC, 2000 units daily ______

## 2014-07-21 ENCOUNTER — Encounter: Payer: Self-pay | Admitting: Endocrinology

## 2014-07-21 ENCOUNTER — Other Ambulatory Visit: Payer: Self-pay | Admitting: *Deleted

## 2014-07-21 DIAGNOSIS — E039 Hypothyroidism, unspecified: Secondary | ICD-10-CM

## 2014-07-21 MED ORDER — LEVOTHYROXINE SODIUM 175 MCG PO TABS: 175.0000 ug | ORAL_TABLET | Freq: Every day | ORAL | Status: DC

## 2014-07-30 ENCOUNTER — Ambulatory Visit: Payer: Medicare Other | Admitting: *Deleted

## 2014-08-06 ENCOUNTER — Other Ambulatory Visit: Payer: Medicare Other

## 2014-08-10 ENCOUNTER — Ambulatory Visit: Payer: Medicare Other | Admitting: Endocrinology

## 2014-09-15 ENCOUNTER — Ambulatory Visit: Payer: Medicare Other | Admitting: Family Medicine

## 2014-10-14 ENCOUNTER — Telehealth: Payer: Self-pay | Admitting: *Deleted

## 2014-10-14 NOTE — Telephone Encounter (Signed)
LMOM for pt to come in to get flu shot.

## 2015-02-16 ENCOUNTER — Telehealth: Payer: Self-pay

## 2015-02-16 NOTE — Telephone Encounter (Signed)
LVM for pt to call back.   RE: need to know the reason for the DAW synthroid and to verify rx coverage information.

## 2015-03-31 IMAGING — CT CT NECK W/ CM
4 of 6 series · 14 of 33 positions shown, 16 images · IV contrast (75CC OMNI 300)
Comparison: Nuclear Medicine sestamibi parathyroid scan [DATE].

CLINICAL DATA: 67-year-old female with hyperparathyroidism and
planned thyroidectomy/parathyroidectomy. Abnormal sestamibi scan.

EXAM:
CT NECK WITH CONTRAST
TECHNIQUE: Multidetector CT imaging of the neck was performed using the
standard protocol following the bolus administration of intravenous
contrast.
CONTRAST:  75mL OMNIPAQUE IOHEXOL 300 MG/ML  SOLN

[Series 3: axial neck · axial · 0.33mm/px · z∈[+104,+176]mm · 2 of 87 slices shown]
[im 29/87  bone]
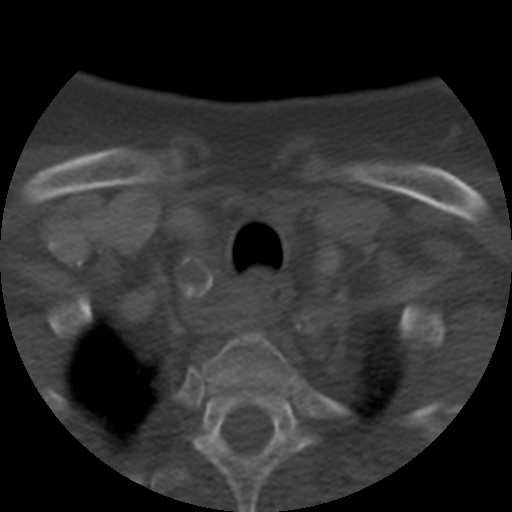
[im 58/87  bone]
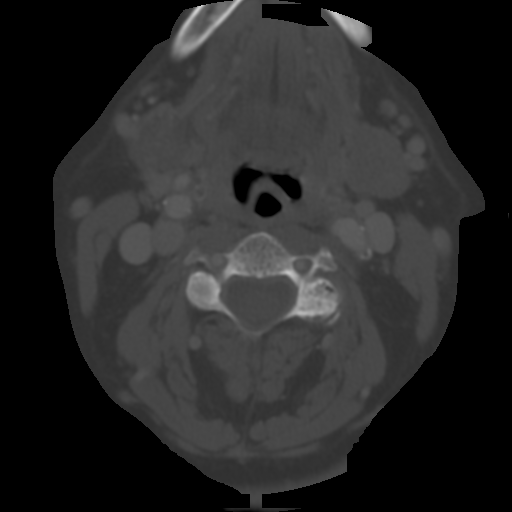

[Series 200: coronal · coronal · 0.44mm/px · 3 of 88 slices shown]
[im 22/88  bone]
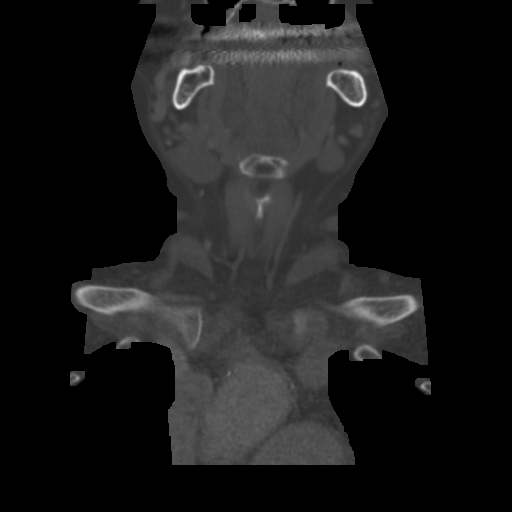
[im 37/88  bone]
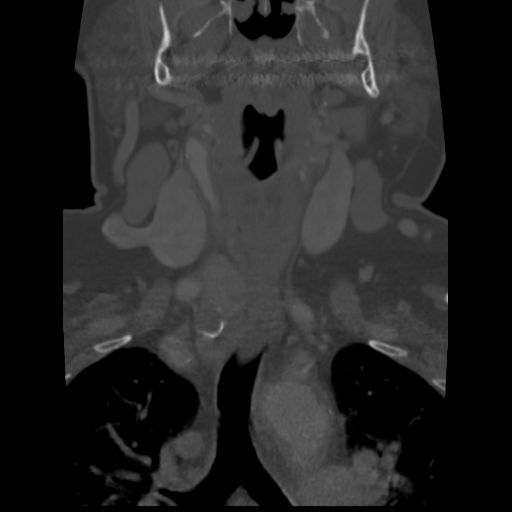
[im 52/88  bone]
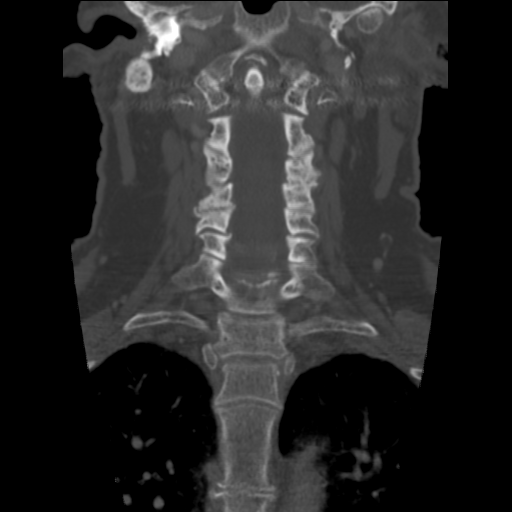

[Series 201: sagittal · sagittal · 0.44mm/px · 5 of 87 slices shown, 6 images]
[im 29/87  bone]
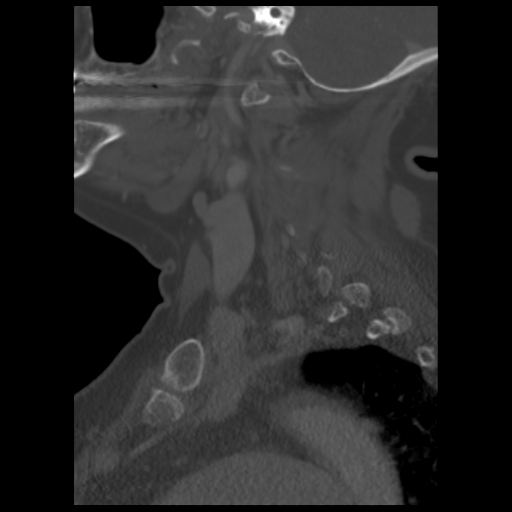
[im 36/87  bone]
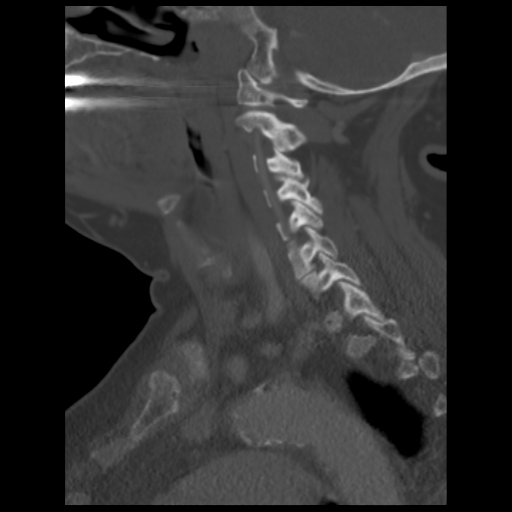
[im 44/87  soft-tissue]
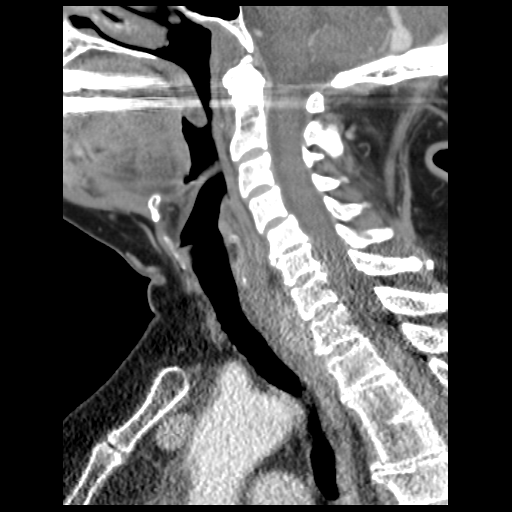
[im 44/87  bone]
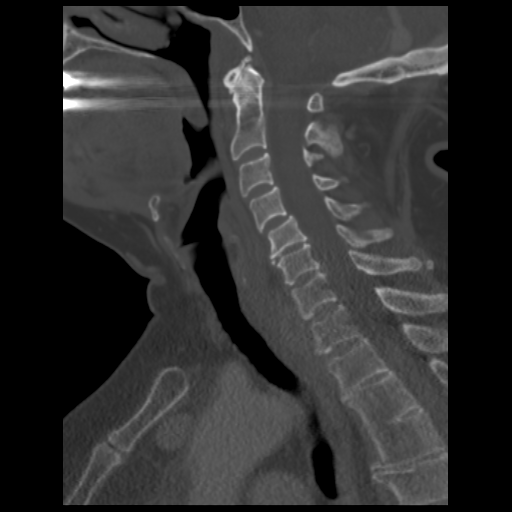
[im 51/87  bone]
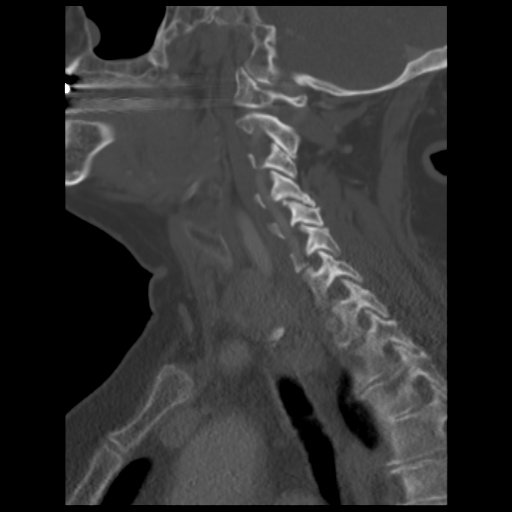
[im 58/87  bone]
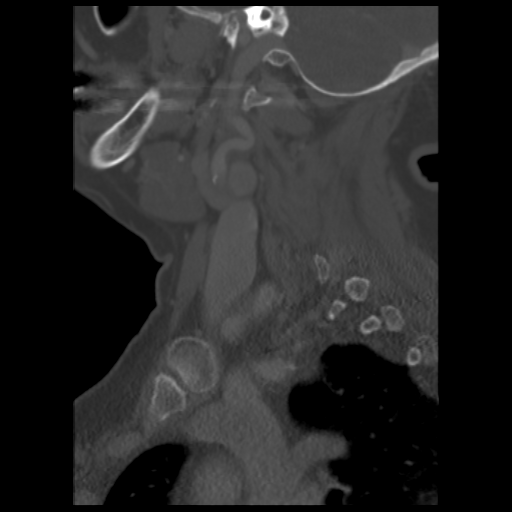

[Series 202: angled to hyoid · axial · 0.44mm/px · z∈[+37,+175]mm · 4 of 121 slices shown, 5 images]
[im 25/121  soft-tissue]
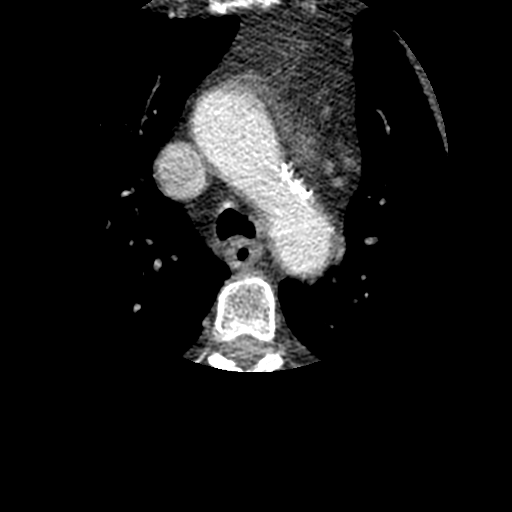
[im 25/121  bone]
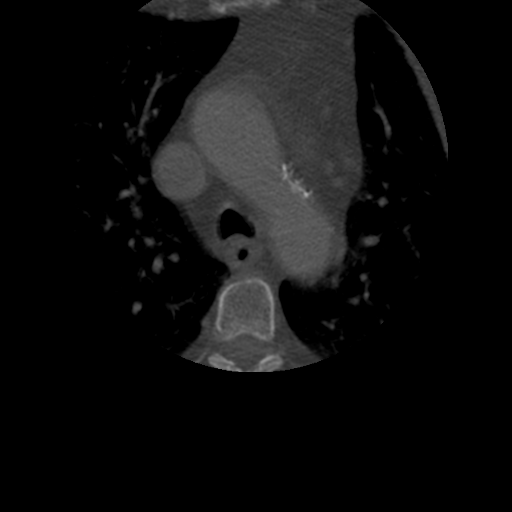
[im 49/121  bone]
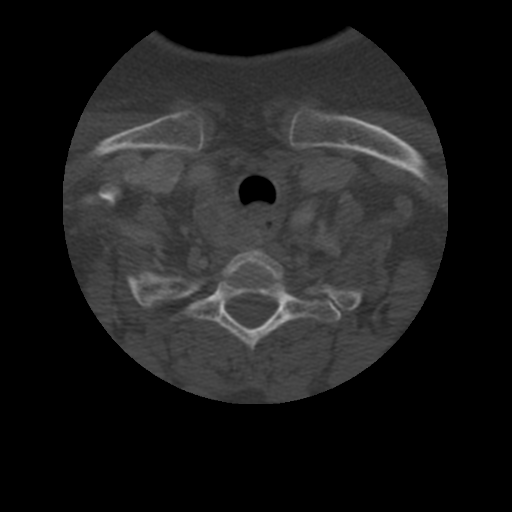
[im 73/121  bone]
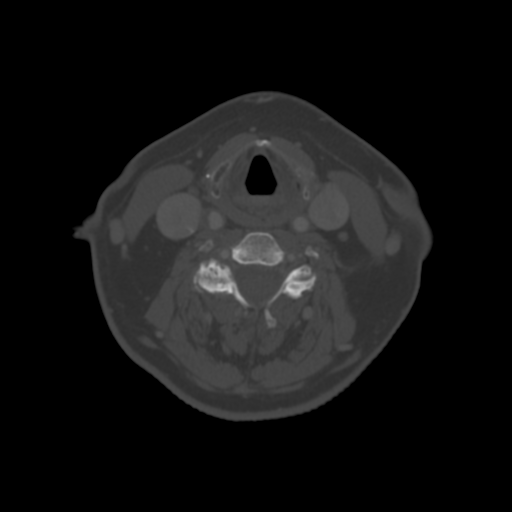
[im 97/121  bone]
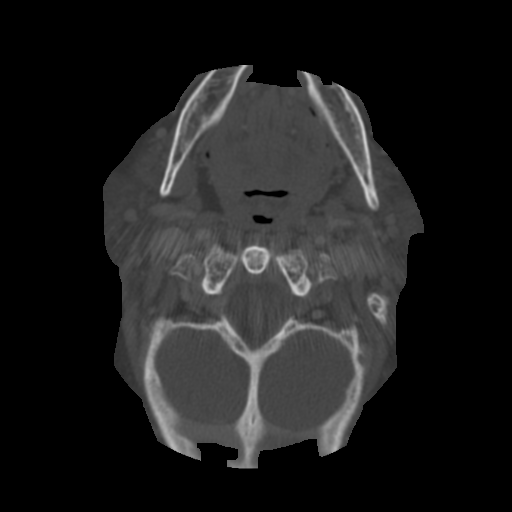

[14 of 33 positions shown; findings below may reference images not displayed]

FINDINGS: Thyroid parenchyma in the thyroid bed appears normal to decreased in
volume. However, there is a large exophytic heterogeneous density
mass with its epicenter at the right tracheoesophageal groove
extending from the level of the thyroid into the superior
mediastinum in a right paratracheal fashion. The lesion might have
AE small connection to the right thyroid lobe demonstrated on series
3, image 55 (arrow). Within the lesion is a coarsely calcified 15 mm
nodule. This lobulated mass overall encompasses 36 x 27 x 50 mm (AP
by transverse by CC). No prominent or tortuous supplying vasculature
is demonstrated to this lesion, as is typically seen with clinically
apparent parathyroid adenomas.

This lesion is inseparable from and mildly displaces the upper
esophagus, but is hyperdense compared to the esophageal wall.

No level 4, thoracic inlet, or superior mediastinal lymphadenopathy.

However, there is a 2nd suspicious nodule in the right neck at the
level of the carotid bifurcation situated posterior to the right
carotid between the right carotid and IJ, measuring 13 x 13 x 17 mm
(series 3, image 31 and coronal image 42). There is also no
prominent vascular supply to this entity identified.

No cervical lymphadenopathy elsewhere.

Negative lung apices. Large main pulmonary artery measuring up to 38
mm diameter (verses caliber of the adjacent ascending aorta of 34
mm. Calcified atherosclerosis. Partially retropharyngeal course of
both carotid arteries. Negative larynx, pharynx, parapharyngeal
spaces, retropharyngeal space, sublingual space, submandibular
glands, parotid glands, and visualized brain parenchyma.

Completely opacified right tympanic cavity and mastoids. The right
sigmoid sinus remains patent. Other Visualized paranasal sinuses and
mastoids are clear.

Degenerative changes in the cervical spine. No acute or suspicious
osseous lesion identified.
IMPRESSION: 1. Large oblong heterogeneous soft tissue mass extending from the
right thoracic inlet into the superior mediastinum along the right
tracheoesophageal groove corresponding to the dominant lesion on
06/24/2013. 36 x 27 x 50 mm. This might have a narrow connection to
the right thyroid lobe which otherwise appears diminutive. Also the
lesion is inseparable from, but has a different density than, the
upper esophagus.

Main differential considerations include large exophytic thyroid
nodule and parathyroid nodule/mass.

2. Smaller similar density round nodule associated with the right
carotid space (posterior to the carotid at the level of the carotid
bifurcation) measuring 17 mm. This corresponds to the 2nd
scintigraphically apparent lesion on 06/24/2013.

Differential considerations for this lesion include ectopic
parathyroid nodule/mass, paraganglioma, schwannoma, and metastatic
node in the setting of parathyroid malignancy.

3. Evidence of pulmonary artery hypertension.

4. Right middle ear and mastoid inflammatory changes, could be
chronic.

## 2015-05-02 ENCOUNTER — Other Ambulatory Visit: Payer: Self-pay

## 2015-06-10 ENCOUNTER — Other Ambulatory Visit: Payer: Self-pay

## 2015-06-10 DIAGNOSIS — Z1231 Encounter for screening mammogram for malignant neoplasm of breast: Secondary | ICD-10-CM

## 2015-06-17 ENCOUNTER — Ambulatory Visit: Payer: Self-pay

## 2015-06-20 ENCOUNTER — Encounter: Payer: Self-pay | Admitting: *Deleted

## 2015-07-14 ENCOUNTER — Ambulatory Visit (INDEPENDENT_AMBULATORY_CARE_PROVIDER_SITE_OTHER): Payer: Medicare Other | Admitting: Physician Assistant

## 2015-07-14 VITALS — BP 140/70 | HR 76 | Temp 97.5°F | Resp 16 | Ht 66.0 in | Wt 190.0 lb

## 2015-07-14 DIAGNOSIS — H6121 Impacted cerumen, right ear: Secondary | ICD-10-CM | POA: Diagnosis not present

## 2015-07-14 DIAGNOSIS — E039 Hypothyroidism, unspecified: Secondary | ICD-10-CM | POA: Diagnosis not present

## 2015-07-14 DIAGNOSIS — Z780 Asymptomatic menopausal state: Secondary | ICD-10-CM | POA: Diagnosis not present

## 2015-07-14 DIAGNOSIS — Z139 Encounter for screening, unspecified: Secondary | ICD-10-CM

## 2015-07-14 MED ORDER — LEVOTHYROXINE SODIUM 175 MCG PO TABS: 175.0000 ug | ORAL_TABLET | Freq: Every day | ORAL | Status: DC

## 2015-07-14 NOTE — Progress Notes (Signed)
   Linda Atkinson  MRN: [id number] DOB: [birth year]  Subjective:  Pt presents to clinic for a refill of her thyroid medication.  She has been out for 6 weeks.  She also has her left ear which is blocked with wax that she would like removed.  In the past 9 months she has lose 62 lbs with the CLEAN lifestyle.  She only eats chicken and fish and veggies and fruit.  She feel really good about what she has done and she has more energy that she used to.  She is currently taking no medications or supplements.  Patient Active Problem List   Diagnosis Date Noted  . Type II or unspecified type diabetes mellitus without mention of complication, uncontrolled 04/19/2014  . Tobacco abuse 04/18/2014  . Arthralgia 06/29/2013  . Hyperparathyroidism 06/11/2013  . Unspecified hypothyroidism 06/11/2013    Current Outpatient Prescriptions on File Prior to Visit  Medication Sig Dispense Refill  . Blood Glucose Monitoring Suppl (BLOOD GLUCOSE METER KIT AND SUPPLIES) KIT One touch ultra 2/ use once daily to check blood sugar. (FOR ICD-9 250.00, 1 each 0  . glucose blood test strip Use once daily to check blood sugar dx code 250.00 100 each 2  . ibuprofen (ADVIL,MOTRIN) 200 MG tablet Take 200 mg by mouth every 6 (six) hours as needed for pain.    . Lancets Misc. MISC One touch delica/ to check blood sugar once daily dx code 250.00 100 each 2   No current facility-administered medications on file prior to visit.    Allergies  Allergen Reactions  . Lipitor [Atorvastatin] Other (See Comments)    Muscle pain.    Review of Systems  HENT: Positive for hearing loss.    Objective:  BP 140/70 mmHg  Pulse 76  Temp(Src) 97.5 F (36.4 C) (Oral)  Resp 16  Ht $R'5\' 6"'mG$  (1.676 m)  Wt 190 lb (86.183 kg)  BMI 30.68 kg/m2  SpO2 96%  Physical Exam  Constitutional: She is oriented to person, place, and time and well-developed, well-nourished, and in no distress.  HENT:  Head: Normocephalic and atraumatic.  Right Ear:  Hearing and external ear normal.  Left Ear: Hearing and external ear normal.  Bilateral cerumen in external ear canals.  Right canal is blocked 100% and left canal is about 50% blocked.  Eyes: Conjunctivae are normal.  Neck: Normal range of motion.  Pulmonary/Chest: Effort normal.  Neurological: She is alert and oriented to person, place, and time. Gait normal.  Skin: Skin is warm.  Psychiatric: Mood, memory, affect and judgment normal.  Vitals reviewed.  Cerumen impaction removed with lavage.  Assessment and Plan :  Screening - Plan: DG Bone Density  Hypothyroidism, unspecified hypothyroidism type - Plan: levothyroxine (SYNTHROID, LEVOTHROID) 175 MCG tablet - refill her medications and she will recheck in 7 weeks and we will recheck her labs at that time since she has been off the medication for the last 6 weeks.  We will check her other labs at that time also.  Post-menopausal - Plan: DG Bone Density - pt to continue no supplements until she has this done due to her history of parathyroid tumor - we will check her Vit D at her next visit.  Cerumen impaction, right  Windell Hummingbird PA-C  [facility] 07/14/2015 12:07 PM

## 2015-07-15 ENCOUNTER — Other Ambulatory Visit: Payer: Self-pay

## 2015-07-15 DIAGNOSIS — E2839 Other primary ovarian failure: Secondary | ICD-10-CM

## 2015-07-21 ENCOUNTER — Ambulatory Visit: Payer: Self-pay

## 2015-08-23 ENCOUNTER — Ambulatory Visit
Admission: RE | Admit: 2015-08-23 | Discharge: 2015-08-23 | Disposition: A | Discharge status: Home / Self Care | Payer: Medicare Other | Source: Ambulatory Visit

## 2015-08-23 ENCOUNTER — Ambulatory Visit
Admission: RE | Admit: 2015-08-23 | Discharge: 2015-08-23 | Disposition: A | Discharge status: Home / Self Care | Payer: Medicare Other | Source: Ambulatory Visit | Attending: Physician Assistant | Admitting: Physician Assistant

## 2015-08-23 DIAGNOSIS — E2839 Other primary ovarian failure: Secondary | ICD-10-CM

## 2015-08-23 DIAGNOSIS — Z1231 Encounter for screening mammogram for malignant neoplasm of breast: Secondary | ICD-10-CM

## 2015-09-01 ENCOUNTER — Encounter: Payer: Self-pay | Admitting: Physician Assistant

## 2015-09-01 ENCOUNTER — Ambulatory Visit (INDEPENDENT_AMBULATORY_CARE_PROVIDER_SITE_OTHER): Payer: Medicare Other | Admitting: Physician Assistant

## 2015-09-01 VITALS — BP 128/72 | HR 61 | Temp 98.1°F | Resp 16 | Wt 184.0 lb

## 2015-09-01 DIAGNOSIS — E038 Other specified hypothyroidism: Secondary | ICD-10-CM

## 2015-09-01 DIAGNOSIS — E7800 Pure hypercholesterolemia, unspecified: Secondary | ICD-10-CM | POA: Diagnosis not present

## 2015-09-01 DIAGNOSIS — E034 Atrophy of thyroid (acquired): Secondary | ICD-10-CM

## 2015-09-01 DIAGNOSIS — E213 Hyperparathyroidism, unspecified: Secondary | ICD-10-CM

## 2015-09-01 DIAGNOSIS — M8580 Other specified disorders of bone density and structure, unspecified site: Secondary | ICD-10-CM | POA: Diagnosis not present

## 2015-09-01 DIAGNOSIS — E119 Type 2 diabetes mellitus without complications: Secondary | ICD-10-CM

## 2015-09-01 LAB — COMPLETE METABOLIC PANEL WITH GFR
ALT: 11 U/L (ref 6–29)
AST: 12 U/L (ref 10–35)
Albumin: 4 g/dL (ref 3.6–5.1)
Alkaline Phosphatase: 96 U/L (ref 33–130)
BUN: 7 mg/dL (ref 7–25)
CHLORIDE: 103 mmol/L (ref 98–110)
CO2: 27 mmol/L (ref 20–31)
Calcium: 10.2 mg/dL (ref 8.6–10.4)
Creat: 0.63 mg/dL (ref 0.50–0.99)
GFR, Est African American: >89 mL/min (ref >=60)
GFR, Est Non African American: >89 mL/min (ref >=60)
GLUCOSE: 82 mg/dL (ref 65–99)
POTASSIUM: 4 mmol/L (ref 3.5–5.3)
SODIUM: 139 mmol/L (ref 135–146)
Total Bilirubin: 0.8 mg/dL (ref 0.2–1.2)
Total Protein: 6.2 g/dL (ref 6.1–8.1)

## 2015-09-01 LAB — LIPID PANEL
Cholesterol: 191 mg/dL (ref 125–200)
HDL: 44 mg/dL — AB (ref >=46)
LDL CALC: 124 mg/dL (ref <130)
TRIGLYCERIDES: 116 mg/dL (ref <150)
Total CHOL/HDL Ratio: 4.3 Ratio (ref <=5.0)
VLDL: 23 mg/dL (ref <30)

## 2015-09-01 LAB — HEMOGLOBIN A1C
Hgb A1c MFr Bld: 5.7 % — ABNORMAL HIGH (ref <5.7)
Mean Plasma Glucose: 117 mg/dL — ABNORMAL HIGH (ref <117)

## 2015-09-01 LAB — TSH: TSH: 0.248 u[IU]/mL — ABNORMAL LOW (ref 0.350–4.500)

## 2015-09-01 NOTE — Patient Instructions (Signed)
In order for your supplemental calcium to be effective.  Please take calcium either on an empty stomach or with food that does not contain calcium.  Your body is able only to absorb 500mg  of Calcium at a time from any one source.  Do not take with a MVI with iron because iron does not allow th calcium to be absorbed.  Please take Calcium citrate 500mg  2-3x/day.  This supplement should have Vit D in it and if your pills do not please add addition Vit D 400 IU with each dose.

## 2015-09-01 NOTE — Progress Notes (Signed)
   Linda Atkinson  MRN: [id number] DOB: Dec 18, 1945  Subjective:  Pt presents to clinic for recheck and labs.  She is feeling better now that she is back on her thyroid medications.  She has not fasted today. And she has no other concerns.  Patient Active Problem List   Diagnosis Date Noted  . Diabetes type 2, controlled ([place]) 04/19/2014  . Tobacco abuse 04/18/2014  . Arthralgia 06/29/2013  . Hyperparathyroidism ([place]) 06/11/2013  . Hypothyroidism (acquired) 06/11/2013    Current Outpatient Prescriptions on File Prior to Visit  Medication Sig Dispense Refill  . Blood Glucose Monitoring Suppl (BLOOD GLUCOSE METER KIT AND SUPPLIES) KIT One touch ultra 2/ use once daily to check blood sugar. (FOR ICD-9 250.00, 1 each 0  . glucose blood test strip Use once daily to check blood sugar dx code 250.00 100 each 2  . ibuprofen (ADVIL,MOTRIN) 200 MG tablet Take 200 mg by mouth every 6 (six) hours as needed for pain.    . Lancets Misc. MISC One touch delica/ to check blood sugar once daily dx code 250.00 100 each 2  . levothyroxine (SYNTHROID, LEVOTHROID) 175 MCG tablet Take 1 tablet (175 mcg total) by mouth daily before breakfast. 30 tablet 1   No current facility-administered medications on file prior to visit.    Allergies  Allergen Reactions  . Lipitor [Atorvastatin] Other (See Comments)    Muscle pain.    Review of Systems Objective:  BP 128/72 mmHg  Pulse 61  Temp(Src) 98.1 F (36.7 C) (Oral)  Resp 16  Wt 184 lb (83.462 kg)  Physical Exam  Constitutional: She is oriented to person, place, and time and well-developed, well-nourished, and in no distress.  HENT:  Head: Normocephalic and atraumatic.  Right Ear: Hearing and external ear normal.  Left Ear: Hearing and external ear normal.  Eyes: Conjunctivae are normal.  Neck: Normal range of motion.  Cardiovascular: Normal rate, regular rhythm and normal heart sounds.   No murmur heard. Pulmonary/Chest: Effort normal and breath  sounds normal. She has no wheezes.  Neurological: She is alert and oriented to person, place, and time. Gait normal.  Skin: Skin is warm and dry.  Psychiatric: Mood, memory, affect and judgment normal.  Vitals reviewed.   Assessment and Plan :  Osteopenia - Plan: Vit D  25 hydroxy (rtn osteoporosis monitoring)  Hypothyroidism due to acquired atrophy of thyroid - Plan: TSH  Hyperparathyroidism ([place])  Controlled type 2 diabetes mellitus without complication, without long-term current use of insulin ([place]) - Plan: COMPLETE METABOLIC PANEL WITH GFR, Hemoglobin A1c  Check labs and make changes as indicated by the labs.  She will need a refill of her Synthroid when the labs return and she needs to be on brand due to hard to control hypothyroidism.  She will recheck in 6 months unless indicated different from her labs.  She will continue her great weight loss efforts.  Windell Hummingbird PA-C  [facility] 09/01/2015 1:54 PM

## 2015-09-02 LAB — VITAMIN D 25 HYDROXY (VIT D DEFICIENCY, FRACTURES): VIT D 25 HYDROXY: 15 ng/mL — AB (ref 30–100)

## 2015-09-06 ENCOUNTER — Other Ambulatory Visit: Payer: Self-pay | Admitting: Physician Assistant

## 2015-09-06 DIAGNOSIS — E039 Hypothyroidism, unspecified: Secondary | ICD-10-CM

## 2015-09-06 MED ORDER — LEVOTHYROXINE SODIUM 150 MCG PO TABS: 150.0000 ug | ORAL_TABLET | Freq: Every day | ORAL | Status: DC

## 2015-10-03 ENCOUNTER — Encounter: Payer: Self-pay | Admitting: Internal Medicine

## 2015-10-06 HISTORY — PX: OTHER SURGICAL HISTORY: SHX169

## 2015-12-14 ENCOUNTER — Other Ambulatory Visit: Payer: Self-pay | Admitting: Physician Assistant

## 2015-12-15 ENCOUNTER — Other Ambulatory Visit: Payer: Self-pay | Admitting: Physician Assistant

## 2015-12-17 ENCOUNTER — Other Ambulatory Visit: Payer: Self-pay | Admitting: Physician Assistant

## 2016-02-10 ENCOUNTER — Telehealth: Payer: Self-pay

## 2016-02-10 NOTE — Telephone Encounter (Signed)
Ms. Tanks has made an appointment with Windell Hummingbird on 04/05/2016 @ 1:00pm, but will run out of her medication before that date.  Patient wants to know if it can be refilled again, since she runs out on 03/01/2016.   Medication was being refilled for 12 months, now only 90 days at a time and not sure why.  SYNTHROID 150 MCG tablet [id number]   Pharmacy:  [facility]  Pt # [phone] best to call on cell.

## 2016-02-13 MED ORDER — SYNTHROID 150 MCG PO TABS: ORAL_TABLET | ORAL | Status: DC

## 2016-02-13 NOTE — Telephone Encounter (Signed)
Rx sent 

## 2016-03-02 ENCOUNTER — Ambulatory Visit (INDEPENDENT_AMBULATORY_CARE_PROVIDER_SITE_OTHER): Payer: PPO | Admitting: Physician Assistant

## 2016-03-02 DIAGNOSIS — S46811A Strain of other muscles, fascia and tendons at shoulder and upper arm level, right arm, initial encounter: Secondary | ICD-10-CM

## 2016-03-02 DIAGNOSIS — M62838 Other muscle spasm: Secondary | ICD-10-CM

## 2016-03-02 MED ORDER — CYCLOBENZAPRINE HCL 5 MG PO TABS: 5.0000 mg | ORAL_TABLET | Freq: Three times a day (TID) | ORAL | Status: DC | PRN

## 2016-03-02 MED ORDER — DICLOFENAC SODIUM 75 MG PO TBEC: 75.0000 mg | DELAYED_RELEASE_TABLET | Freq: Two times a day (BID) | ORAL | Status: DC

## 2016-03-02 MED ORDER — HYDROCODONE-ACETAMINOPHEN 5-325 MG PO TABS: 1.0000 | ORAL_TABLET | Freq: Four times a day (QID) | ORAL | Status: DC | PRN
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
## 2016-03-02 NOTE — Patient Instructions (Addendum)
  Heat and rest   IF you received an x-ray today, you will receive an invoice from [facility]. Please contact [facility] at [phone] with questions or concerns regarding your invoice.   IF you received labwork today, you will receive an invoice from [employer]. Please contact [facility] at [phone] with questions or concerns regarding your invoice.   Our billing staff will not be able to assist you with questions regarding bills from these companies.  You will be contacted with the lab results as soon as they are available. The fastest way to get your results is to activate your My Chart account. Instructions are located on the last page of this paperwork. If you have not heard from [place] regarding the results in 2 weeks, please contact this office.

## 2016-04-05 ENCOUNTER — Ambulatory Visit: Payer: Self-pay | Admitting: Physician Assistant

## 2016-07-26 ENCOUNTER — Telehealth: Payer: Self-pay | Admitting: Family Medicine

## 2016-07-26 ENCOUNTER — Ambulatory Visit: Payer: PPO

## 2016-07-26 MED ORDER — SYNTHROID 150 MCG PO TABS: ORAL_TABLET | ORAL | 0 refills | Status: DC

## 2016-07-26 NOTE — Telephone Encounter (Signed)
Patient requesting refill of Synthroid until her next appointment with PA Weber.  Refill approved.

## 2016-08-01 ENCOUNTER — Encounter: Payer: Self-pay | Admitting: Physician Assistant

## 2016-08-01 ENCOUNTER — Ambulatory Visit (INDEPENDENT_AMBULATORY_CARE_PROVIDER_SITE_OTHER): Payer: PPO | Admitting: Physician Assistant

## 2016-08-01 VITALS — BP 142/70 | HR 60 | Temp 98.1°F | Resp 16 | Ht 66.0 in | Wt 208.9 lb

## 2016-08-01 DIAGNOSIS — Z720 Tobacco use: Secondary | ICD-10-CM

## 2016-08-01 DIAGNOSIS — R030 Elevated blood-pressure reading, without diagnosis of hypertension: Secondary | ICD-10-CM | POA: Diagnosis not present

## 2016-08-01 DIAGNOSIS — R7309 Other abnormal glucose: Secondary | ICD-10-CM

## 2016-08-01 DIAGNOSIS — E039 Hypothyroidism, unspecified: Secondary | ICD-10-CM | POA: Diagnosis not present

## 2016-08-01 DIAGNOSIS — E7800 Pure hypercholesterolemia, unspecified: Secondary | ICD-10-CM | POA: Diagnosis not present

## 2016-08-01 DIAGNOSIS — H6123 Impacted cerumen, bilateral: Secondary | ICD-10-CM | POA: Diagnosis not present

## 2016-08-01 DIAGNOSIS — IMO0001 Reserved for inherently not codable concepts without codable children: Secondary | ICD-10-CM

## 2016-08-01 DIAGNOSIS — R5382 Chronic fatigue, unspecified: Secondary | ICD-10-CM | POA: Diagnosis not present

## 2016-08-01 DIAGNOSIS — G4700 Insomnia, unspecified: Secondary | ICD-10-CM | POA: Diagnosis not present

## 2016-08-01 LAB — COMPLETE METABOLIC PANEL WITH GFR
ALT: 9 U/L (ref 6–29)
AST: 13 U/L (ref 10–35)
Albumin: 4.4 g/dL (ref 3.6–5.1)
Alkaline Phosphatase: 82 U/L (ref 33–130)
BUN: 7 mg/dL (ref 7–25)
CALCIUM: 10.6 mg/dL — AB (ref 8.6–10.4)
CHLORIDE: 104 mmol/L (ref 98–110)
CO2: 24 mmol/L (ref 20–31)
CREATININE: 0.78 mg/dL (ref 0.50–0.99)
GFR, EST NON AFRICAN AMERICAN: 78 mL/min (ref >=60)
Glucose, Bld: 103 mg/dL — ABNORMAL HIGH (ref 65–99)
POTASSIUM: 4.3 mmol/L (ref 3.5–5.3)
Sodium: 139 mmol/L (ref 135–146)
Total Bilirubin: 0.9 mg/dL (ref 0.2–1.2)
Total Protein: 6.7 g/dL (ref 6.1–8.1)

## 2016-08-01 LAB — LIPID PANEL
CHOLESTEROL: 270 mg/dL — AB (ref 125–200)
HDL: 58 mg/dL (ref >=46)
LDL CALC: 180 mg/dL — AB (ref <130)
Total CHOL/HDL Ratio: 4.7 Ratio (ref <=5.0)
Triglycerides: 162 mg/dL — ABNORMAL HIGH (ref <150)
VLDL: 32 mg/dL — AB (ref <30)

## 2016-08-01 LAB — T4, FREE: FREE T4: 0.6 ng/dL — AB (ref 0.8–1.8)

## 2016-08-01 LAB — TSH: TSH: 42.84 mIU/L — ABNORMAL HIGH

## 2016-08-01 NOTE — Patient Instructions (Addendum)
For therapy -- [facility] ([address] [place]) - [phone] [place]) - [facility] - [phone] [facility] - [phone]     IF you received an x-ray today, you will receive an invoice from [facility]. Please contact [facility] at [phone] with questions or concerns regarding your invoice.   IF you received labwork today, you will receive an invoice from [employer]. Please contact [facility] at [phone] with questions or concerns regarding your invoice.   Our billing staff will not be able to assist you with questions regarding bills from these companies.  You will be contacted with the lab results as soon as they are available. The fastest way to get your results is to activate your My Chart account. Instructions are located on the last page of this paperwork. If you have not heard from [place] regarding the results in 2 weeks, please contact this office.

## 2016-08-01 NOTE — Progress Notes (Signed)
Linda Atkinson  MRN: [id number] DOB: [birth year]  Subjective:  Pt presents to clinic for thyroid check.  She feels like her thyroid is off - she felt the best when she was on Synthroid 250m.  She had been able to lose quite a bit of weight with supplementing her diet with smoothies and now she has return to more meals and her weight has returned - that is depressing for her and she feels like she can do it again but she is frustrated.  She is sad a lot of the time - she has lost a lot of people close to her in the last several years.  She is sad most of the time though she tries to not let people know about it.  She is not sleeping well.  She goes to sleep ok but she wakes up frequently throughout the night.  She has always had sleep problems but she has noticed that it is worse with her depression worsening.  She helps her sister financially and that is a drain on her but she feels responsible and at the same time feels like she enables her sister to continue to act like this.  She has been to therapy before and she would like to try that again before she starts medications.  Her ears are stopped up - she has had problems with ear wax in the past and she thinks that is the problem now.   Review of Systems  Constitutional: Positive for fatigue.  Psychiatric/Behavioral: Positive for dysphoric mood and sleep disturbance.    Patient Active Problem List   Diagnosis Date Noted  . Insomnia 08/01/2016  . Diabetes type 2, controlled ([place] 04/19/2014  . Tobacco abuse 04/18/2014  . Arthralgia 06/29/2013  . Hyperparathyroidism ([place] 06/11/2013  . Hypothyroidism (acquired) 06/11/2013    Current Outpatient Prescriptions on File Prior to Visit  Medication Sig Dispense Refill  . SYNTHROID 150 MCG tablet TAKE 1 TABLET (150 MCG TOTAL) BY MOUTH DAILY. 90 tablet 0  . Blood Glucose Monitoring Suppl (BLOOD GLUCOSE METER KIT AND SUPPLIES) KIT One touch ultra 2/ use once daily to check blood sugar. (FOR ICD-9  250.00, 1 each 0  . ibuprofen (ADVIL,MOTRIN) 200 MG tablet Take 200 mg by mouth every 6 (six) hours as needed for pain.     No current facility-administered medications on file prior to visit.     Allergies  Allergen Reactions  . Lipitor [Atorvastatin] Other (See Comments)    Muscle pain.    Pt patients past, family and social history were reviewed and updated.  Objective:  BP (!) 142/70   Pulse 60   Temp 98.1 F (36.7 C) (Oral)   Resp 16   Ht '5\' 6"'  (1.676 m)   Wt 208 lb 14.4 oz (94.8 kg)   SpO2 98%   BMI 33.72 kg/m   Physical Exam  Constitutional: She is oriented to person, place, and time and well-developed, well-nourished, and in no distress.  HENT:  Head: Normocephalic and atraumatic.  Right Ear: Hearing and external ear normal.  Left Ear: Hearing and external ear normal.  Eyes: Conjunctivae are normal.  Neck: Normal range of motion.  Cardiovascular: Normal rate and regular rhythm.   Murmur (2/6 systolic) heard. Pulmonary/Chest: Effort normal and breath sounds normal. She has no wheezes.  Neurological: She is alert and oriented to person, place, and time. Gait normal.  Skin: Skin is warm and dry.  Psychiatric: Mood, memory, affect and judgment normal.  Vitals reviewed.  Assessment and Plan :  Hypothyroidism (acquired) - Plan: TSH, T4, Free - check labs - pt really believes this is what is making her sleep, depression and weight loss have problems - she would like to wait for her labs to return before we make other changes to medications  Tobacco abuse - try to decrease smoking  Insomnia - always been a problem - hopes that therapy for her depression will help with this and if it does not she will consider treatment  Elevated cholesterol - Plan: COMPLETE METABOLIC PANEL WITH GFR, Lipid panel - check labs  Elevated hemoglobin A1c - Plan: Hemoglobin A1c - check labs again to make sure she has not developed diabetes with her weight gain  Cerumen impaction,  bilateral - Plan: Ear wax removal - removed with lavage  Chronic fatigue  Elevated BP - monitor at this time  Windell Hummingbird PA-C  [facility] 08/04/2016 7:48 AM

## 2016-08-02 LAB — HEMOGLOBIN A1C
Hgb A1c MFr Bld: 5.3 % (ref <5.7)
Mean Plasma Glucose: 105 mg/dL

## 2016-08-06 MED ORDER — SYNTHROID 175 MCG PO TABS: 175.0000 ug | ORAL_TABLET | Freq: Every day | ORAL | 2 refills | Status: DC

## 2016-08-06 NOTE — Addendum Note (Signed)
Addended by: Mancel Bale on: 08/06/2016 12:02 PM   Modules accepted: Orders

## 2016-08-30 ENCOUNTER — Ambulatory Visit: Payer: Medicare Other | Admitting: Licensed Clinical Social Worker

## 2016-08-31 ENCOUNTER — Ambulatory Visit: Payer: Medicare Other | Admitting: Licensed Clinical Social Worker

## 2016-11-16 ENCOUNTER — Other Ambulatory Visit: Payer: Self-pay | Admitting: Physician Assistant

## 2016-12-20 ENCOUNTER — Ambulatory Visit (INDEPENDENT_AMBULATORY_CARE_PROVIDER_SITE_OTHER): Payer: PPO | Admitting: Physician Assistant

## 2016-12-20 ENCOUNTER — Encounter: Payer: Self-pay | Admitting: Physician Assistant

## 2016-12-20 VITALS — BP 150/76 | HR 71 | Temp 98.5°F | Resp 18 | Ht 66.0 in | Wt 208.0 lb

## 2016-12-20 DIAGNOSIS — E039 Hypothyroidism, unspecified: Secondary | ICD-10-CM

## 2016-12-20 NOTE — Patient Instructions (Signed)
     IF you received an x-ray today, you will receive an invoice from [name]. Please contact [facility] at [phone] with questions or concerns regarding your invoice.   IF you received labwork today, you will receive an invoice from [facility]. Please contact [facility] at [phone] with questions or concerns regarding your invoice.   Our billing staff will not be able to assist you with questions regarding bills from these companies.  You will be contacted with the lab results as soon as they are available. The fastest way to get your results is to activate your My Chart account. Instructions are located on the last page of this paperwork. If you have not heard from us regarding the results in 2 weeks, please contact this office.     

## 2016-12-20 NOTE — Progress Notes (Signed)
   Linda Atkinson  MRN: [id number] DOB: 1946-02-11  Subjective:  Pt presents to clinic for lab check for her thyroid.  Pt is very upset when I walked in the room due to a miscommunication from the staff regarding her wait.  She feels like her dose is not correct for her synthroid - it was increase in Sept and patient did not f/u in the 8 weeks like instructed.  Her BP is high because she is very upset.  Review of Systems  Patient Active Problem List   Diagnosis Date Noted  . Insomnia 08/01/2016  . Diabetes type 2, controlled ([place]) 04/19/2014  . Tobacco abuse 04/18/2014  . Arthralgia 06/29/2013  . Hyperparathyroidism ([place]) 06/11/2013  . Hypothyroidism (acquired) 06/11/2013    Current Outpatient Prescriptions on File Prior to Visit  Medication Sig Dispense Refill  . ibuprofen (ADVIL,MOTRIN) 200 MG tablet Take 200 mg by mouth every 6 (six) hours as needed for pain.    Marland Kitchen OVER THE COUNTER MEDICATION     . SYNTHROID 175 MCG tablet TAKE ONE TABLET BY MOUTH EVERY MORNING BEFORE BREAKFAST 30 tablet 0  . Blood Glucose Monitoring Suppl (BLOOD GLUCOSE METER KIT AND SUPPLIES) KIT One touch ultra 2/ use once daily to check blood sugar. (FOR ICD-9 250.00, (Patient not taking: Reported on 12/20/2016) 1 each 0   No current facility-administered medications on file prior to visit.     Allergies  Allergen Reactions  . Lipitor [Atorvastatin] Other (See Comments)    Muscle pain.    Pt patients past, family and social history were reviewed and updated.   Objective:  BP (!) 150/76 (BP Location: Right Arm, Patient Position: Sitting, Cuff Size: Large)   Pulse 71   Temp 98.5 F (36.9 C) (Oral)   Resp 18   Ht '5\' 6"'$  (1.676 m)   Wt 208 lb (94.3 kg)   SpO2 98%   BMI 33.57 kg/m   Physical Exam  Constitutional: She is oriented to person, place, and time and well-developed, well-nourished, and in no distress.  HENT:  Head: Normocephalic and atraumatic.  Right Ear: Hearing and external ear normal.    Left Ear: Hearing and external ear normal.  Eyes: Conjunctivae are normal.  Neck: Normal range of motion.  Pulmonary/Chest: Effort normal.  Neurological: She is alert and oriented to person, place, and time. Gait normal.  Skin: Skin is warm and dry.  Psychiatric: Mood, memory, affect and judgment normal.  Vitals reviewed.   Assessment and Plan :  Acquired hypothyroidism - Plan: TSH - check labs - refill medication at appropriate dose once labs are back.  Windell Hummingbird PA-C  [facility] 12/20/2016 6:42 PM

## 2016-12-21 LAB — TSH: TSH: 0.021 u[IU]/mL — AB (ref 0.450–4.500)

## 2016-12-21 MED ORDER — SYNTHROID 175 MCG PO TABS: 175.0000 ug | ORAL_TABLET | Freq: Every day | ORAL | 1 refills | Status: DC

## 2016-12-21 NOTE — Addendum Note (Signed)
Addended by: Mancel Bale on: 12/21/2016 05:03 PM   Modules accepted: Orders

## 2017-01-08 ENCOUNTER — Encounter: Payer: Self-pay | Admitting: Physician Assistant

## 2017-06-04 DIAGNOSIS — Z961 Presence of intraocular lens: Secondary | ICD-10-CM | POA: Diagnosis not present

## 2017-06-04 DIAGNOSIS — H43311 Vitreous membranes and strands, right eye: Secondary | ICD-10-CM | POA: Diagnosis not present

## 2017-06-18 ENCOUNTER — Encounter: Payer: Self-pay | Admitting: Physician Assistant

## 2017-06-18 ENCOUNTER — Ambulatory Visit (INDEPENDENT_AMBULATORY_CARE_PROVIDER_SITE_OTHER): Payer: PPO | Admitting: Physician Assistant

## 2017-06-18 VITALS — BP 110/70 | HR 67 | Temp 98.4°F | Resp 18 | Ht 66.0 in | Wt 200.8 lb

## 2017-06-18 DIAGNOSIS — M069 Rheumatoid arthritis, unspecified: Secondary | ICD-10-CM | POA: Insufficient documentation

## 2017-06-18 DIAGNOSIS — E039 Hypothyroidism, unspecified: Secondary | ICD-10-CM

## 2017-06-18 NOTE — Progress Notes (Signed)
Linda Atkinson  MRN: [id number] DOB: 08-19-1946  PCP: Mancel Bale, PA-C  Chief Complaint  Patient presents with  . Thyroid Problem    recheck     Subjective:  Pt presents to clinic for medication refill and lab check.  She feels good currently.  She had to put her dog to sleep over the weekend and that was hard but she is glad the dog did not have to suffer.  History is obtained by patient.  Review of Systems  HENT: Negative for trouble swallowing.   Endocrine: Negative for cold intolerance and heat intolerance.  Skin:       No skin or hair changes    Patient Active Problem List   Diagnosis Date Noted  . Rheumatoid arthritis ([place]) 06/18/2017  . Insomnia 08/01/2016  . Diabetes type 2, controlled ([place]) 04/19/2014  . Tobacco abuse 04/18/2014  . Arthralgia 06/29/2013  . Hyperparathyroidism ([place]) 06/11/2013  . Hypothyroidism (acquired) 06/11/2013    Current Outpatient Prescriptions on File Prior to Visit  Medication Sig Dispense Refill  . Blood Glucose Monitoring Suppl (BLOOD GLUCOSE METER KIT AND SUPPLIES) KIT One touch ultra 2/ use once daily to check blood sugar. (FOR ICD-9 250.00, 1 each 0  . ibuprofen (ADVIL,MOTRIN) 200 MG tablet Take 200 mg by mouth every 6 (six) hours as needed for pain.    Marland Kitchen OVER THE COUNTER MEDICATION     . SYNTHROID 175 MCG tablet Take 1 tablet (175 mcg total) by mouth daily with breakfast. 90 tablet 1   No current facility-administered medications on file prior to visit.     Allergies  Allergen Reactions  . Lipitor [Atorvastatin] Other (See Comments)    Muscle pain.    Past Medical History:  Diagnosis Date  . Allergy   . Anxiety    about surgery  . Arthritis   . 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 Complication of anesthesia    Sept. 26 2013: "blood pressure dropped"   . Depression    moderate  . GERD (gastroesophageal reflux disease) 1996   hx of  . Pneumonia 2009   hx of  . Rheumatoid arthritis ([place])     . Shortness of breath    with walking  . Thyroid disease    Social History   Social History Narrative   Eating mainly raw foods   walks 5 miles  day   Social History  Substance Use Topics  . Smoking status: Current Every Day Smoker    Packs/day: 0.25    Years: 10.00    Types: Cigarettes  . Smokeless tobacco: Never Used  . Alcohol use No   family history includes Alzheimer's disease in her mother; Cancer in her brother, maternal aunt, paternal aunt, and paternal grandmother; Diabetes in her father; Heart disease in her father; Hyperlipidemia in her father; Kidney disease in her father.     Objective:  BP 110/70   Pulse 67   Temp 98.4 F (36.9 C) (Oral)   Resp 18   Ht 5' 6" (1.676 m)   Wt 200 lb 12.8 oz (91.1 kg)   SpO2 97%   BMI 32.41 kg/m  Body mass index is 32.41 kg/m.  Physical Exam  Constitutional: She is oriented to person, place, and time and well-developed, well-nourished, and in no distress.  HENT:  Head: Normocephalic and atraumatic.  Right Ear: Hearing and external ear normal.  Left Ear: Hearing and external ear normal.  Eyes:  Conjunctivae are normal.  Neck: Normal range of motion.  Cardiovascular: Normal rate, regular rhythm and normal heart sounds.   No murmur heard. Pulmonary/Chest: Effort normal and breath sounds normal. She has no wheezes.  Neurological: She is alert and oriented to person, place, and time. Gait normal.  Skin: Skin is warm and dry.  Psychiatric: Mood, memory, affect and judgment normal.  Vitals reviewed.    Wt Readings from Last 3 Encounters:  06/18/17 200 lb 12.8 oz (91.1 kg)  12/20/16 208 lb (94.3 kg)  08/01/16 208 lb 14.4 oz (94.8 kg)     Assessment and Plan :  Hypothyroidism (acquired) - Plan: TSH, T4, Free check labs - will adjust dose if needed based on labs - f/u will be determined based on thyroid levels  Windell Hummingbird PA-C  [facility] 06/18/2017 9:52 AM

## 2017-06-18 NOTE — Patient Instructions (Signed)
     IF you received an x-ray today, you will receive an invoice from [facility]. Please contact  Radiology at [phone] with questions or concerns regarding your invoice.   IF you received labwork today, you will receive an invoice from [facility]. Please contact [facility] at [phone] with questions or concerns regarding your invoice.   Our billing staff will not be able to assist you with questions regarding bills from these companies.  You will be contacted with the lab results as soon as they are available. The fastest way to get your results is to activate your My Chart account. Instructions are located on the last page of this paperwork. If you have not heard from us regarding the results in 2 weeks, please contact this office.     

## 2017-06-19 LAB — TSH

## 2017-06-19 LAB — T4, FREE: FREE T4: 2.36 ng/dL — AB (ref 0.82–1.77)

## 2017-06-20 MED ORDER — SYNTHROID 150 MCG PO TABS: 150.0000 ug | ORAL_TABLET | Freq: Every day | ORAL | 0 refills | Status: DC

## 2017-06-20 NOTE — Addendum Note (Signed)
Addended by: Mancel Bale on: 06/20/2017 12:14 PM   Modules accepted: Orders

## 2017-07-17 DIAGNOSIS — H5203 Hypermetropia, bilateral: Secondary | ICD-10-CM | POA: Diagnosis not present

## 2017-07-17 DIAGNOSIS — H524 Presbyopia: Secondary | ICD-10-CM | POA: Diagnosis not present

## 2017-07-17 DIAGNOSIS — H43311 Vitreous membranes and strands, right eye: Secondary | ICD-10-CM | POA: Diagnosis not present

## 2017-07-17 DIAGNOSIS — H52221 Regular astigmatism, right eye: Secondary | ICD-10-CM | POA: Diagnosis not present

## 2017-10-07 ENCOUNTER — Other Ambulatory Visit: Payer: Self-pay | Admitting: Physician Assistant

## 2017-11-24 ENCOUNTER — Other Ambulatory Visit: Payer: Self-pay | Admitting: Physician Assistant

## 2018-04-01 ENCOUNTER — Encounter: Payer: Self-pay | Admitting: Family Medicine

## 2018-04-07 ENCOUNTER — Encounter: Payer: Self-pay | Admitting: Physician Assistant

## 2018-04-08 ENCOUNTER — Other Ambulatory Visit: Payer: Self-pay

## 2018-04-08 ENCOUNTER — Encounter: Payer: Self-pay | Admitting: Physician Assistant

## 2018-04-08 ENCOUNTER — Ambulatory Visit (INDEPENDENT_AMBULATORY_CARE_PROVIDER_SITE_OTHER): Payer: PPO | Admitting: Physician Assistant

## 2018-04-08 VITALS — BP 132/80 | HR 95 | Temp 99.1°F | Resp 18 | Ht 66.0 in

## 2018-04-08 DIAGNOSIS — F4321 Adjustment disorder with depressed mood: Secondary | ICD-10-CM

## 2018-04-08 DIAGNOSIS — G479 Sleep disorder, unspecified: Secondary | ICD-10-CM

## 2018-04-08 DIAGNOSIS — E039 Hypothyroidism, unspecified: Secondary | ICD-10-CM

## 2018-04-08 MED ORDER — SYNTHROID 150 MCG PO TABS: ORAL_TABLET | ORAL | 1 refills | Status: DC

## 2018-04-08 NOTE — Progress Notes (Signed)
Linda Atkinson  MRN: [id number] DOB: 1945/11/26  PCP: Mancel Bale, PA-C  Chief Complaint  Patient presents with  . thyroid check    always tired and having sleep problems, also believes she has gain weight  . Depression    screening is an 18     Subjective:  Pt presents to clinic for weight gain and thyroid check.  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  She is a stress eater and admits and acknowledges this has been a problem recently  Not interest in things that used to cause her pleasure but does not feel depressed. Recent death of her dogs - 2 she really cared about and 1 that she got and was sick  Ran out of Synthroid medication several months ago, just could not get in for a appointment.  Feeling many hypothyroid symptoms.  Trouble sleeping -seems to have been worse over the last several months though really has had trouble for years- gets into bed - really sleepy - reads for 10 mins - falls right asleep - 15-20 mins wakes up wide awake and then she lays awake for hours - then she will nap again - this cycle happens 3 times a night -- then about 5-6am she is able to sleep then well but life happens - this has been going on for months -takes nothing for sleep.  Though interested in [employer] oil as a friend recommended it to her.  History is obtained by patient.  Review of Systems  Constitutional: Positive for fatigue.  Psychiatric/Behavioral: Positive for dysphoric mood and sleep disturbance.    Patient Active Problem List   Diagnosis Date Noted  . Rheumatoid arthritis ([place]) 06/18/2017  . Insomnia 08/01/2016  . Diabetes type 2, controlled ([place]) 04/19/2014  . Tobacco abuse 04/18/2014  . Arthralgia 06/29/2013  . Hyperparathyroidism ([place]) 06/11/2013  . Hypothyroidism (acquired) 06/11/2013    Current Outpatient Medications on File Prior to Visit  Medication Sig Dispense  Refill  . ibuprofen (ADVIL,MOTRIN) 200 MG tablet Take 200 mg by mouth every 6 (six) hours as needed for pain.    . Blood Glucose Monitoring Suppl (BLOOD GLUCOSE METER KIT AND SUPPLIES) KIT One touch ultra 2/ use once daily to check blood sugar. (FOR ICD-9 250.00, (Patient not taking: Reported on 04/08/2018) 1 each 0  . OVER THE COUNTER MEDICATION      No current facility-administered medications on file prior to visit.     Allergies  Allergen Reactions  . Lipitor [Atorvastatin] Other (See Comments)    Muscle pain.    Past Medical History:  Diagnosis Date  . Allergy   . Anxiety    about surgery  . Arthritis   . 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 Complication of anesthesia    Sept. 26 2013: "blood pressure dropped"   . Depression    moderate  . GERD (gastroesophageal reflux disease) 1996   hx of  . Pneumonia 2009   hx of  . Rheumatoid arthritis ([place])   . Shortness of breath    with walking  . Thyroid disease    Social History   Social History Narrative   Lives alone         Eating mainly raw foods   walks 5 miles  day   Social History   Tobacco Use  . Smoking status: Current  Every Day Smoker    Packs/day: 0.25    Years: 10.00    Pack years: 2.50    Types: Cigarettes  . Smokeless tobacco: Never Used  Substance Use Topics  . Alcohol use: No    Alcohol/week: 0.0 oz  . Drug use: No   family history includes Alzheimer's disease in her mother; Cancer in her brother, maternal aunt, paternal aunt, and paternal grandmother; Diabetes in her father; Heart disease in her father; Hyperlipidemia in her father; Kidney disease in her father.     Objective:  BP 132/80   Pulse 95   Temp 99.1 F (37.3 C) (Oral)   Resp 18   Ht '5\' 6"'  (1.676 m)   SpO2 98%   BMI 32.41 kg/m  Body mass index is 32.41 kg/m.  Physical Exam  Constitutional: She is oriented to person, place, and time.  HENT:  Head: Normocephalic and atraumatic.  Right Ear: Hearing and  external ear normal.  Left Ear: Hearing and external ear normal.  Eyes: Conjunctivae are normal.  Neck: Normal range of motion.  Cardiovascular: Normal rate, regular rhythm and normal heart sounds.  No murmur heard. Pulmonary/Chest: Effort normal and breath sounds normal. She has no wheezes.  Neurological: She is alert and oriented to person, place, and time.  Skin: Skin is warm and dry.  Psychiatric: Judgment normal.  Vitals reviewed.   Assessment and Plan :  Hypothyroidism (acquired) - Plan: TSH, T4, Free, SYNTHROID 150 MCG tablet -check labs get back on medications.  Recheck in 6 weeks  Grief reaction -consider therapy names given  Sleep disturbance -trial of melatonin XR, hopefully therapy will help, did discuss trazodone possibly for use in the future  Windell Hummingbird PA-C  [facility] 04/08/2018 6:35 PM

## 2018-04-08 NOTE — Patient Instructions (Addendum)
Melatonin XR 5mg  for  For therapy -- [facility] ([address] [place]) - [place]) - [place] - (336) [place], [phone]   IF you received an x-ray today, you will receive an invoice from [facility]. Please contact [facility] at [phone] with questions or concerns regarding your invoice.   IF you received labwork today, you will receive an invoice from [facility]. Please contact [facility] at [phone] with questions or concerns regarding your invoice.   Our billing staff will not be able to assist you with questions regarding bills from these companies.  You will be contacted with the lab results as soon as they are available. The fastest way to get your results is to activate your My Chart account. Instructions are located on the last page of this paperwork. If you have not heard from [place] regarding the results in 2 weeks, please contact this office.

## 2018-04-09 LAB — T4, FREE: Free T4: 0.24 ng/dL — ABNORMAL LOW (ref 0.82–1.77)

## 2018-04-09 LAB — TSH: TSH: 186.4 u[IU]/mL — ABNORMAL HIGH (ref 0.450–4.500)

## 2018-05-20 ENCOUNTER — Ambulatory Visit: Payer: PPO | Admitting: Physician Assistant

## 2018-05-21 ENCOUNTER — Ambulatory Visit: Payer: PPO | Admitting: Physician Assistant

## 2018-05-23 ENCOUNTER — Other Ambulatory Visit: Payer: Self-pay

## 2018-05-23 ENCOUNTER — Encounter: Payer: Self-pay | Admitting: Physician Assistant

## 2018-05-23 ENCOUNTER — Ambulatory Visit (INDEPENDENT_AMBULATORY_CARE_PROVIDER_SITE_OTHER): Payer: PPO | Admitting: Physician Assistant

## 2018-05-23 VITALS — BP 128/78 | HR 79 | Temp 98.8°F | Resp 18 | Ht 66.0 in

## 2018-05-23 DIAGNOSIS — R5383 Other fatigue: Secondary | ICD-10-CM

## 2018-05-23 DIAGNOSIS — E039 Hypothyroidism, unspecified: Secondary | ICD-10-CM

## 2018-05-23 DIAGNOSIS — R030 Elevated blood-pressure reading, without diagnosis of hypertension: Secondary | ICD-10-CM | POA: Diagnosis not present

## 2018-05-23 DIAGNOSIS — F17200 Nicotine dependence, unspecified, uncomplicated: Secondary | ICD-10-CM

## 2018-05-23 NOTE — Patient Instructions (Addendum)
[facility] - [address], [place], [place] [address] Phone: [phone]     IF you received an x-ray today, you will receive an invoice from [facility]. Please contact [facility] at [phone] with questions or concerns regarding your invoice.   IF you received labwork today, you will receive an invoice from [facility]. Please contact [facility] at [phone] with questions or concerns regarding your invoice.   Our billing staff will not be able to assist you with questions regarding bills from these companies.  You will be contacted with the lab results as soon as they are available. The fastest way to get your results is to activate your My Chart account. Instructions are located on the last page of this paperwork. If you have not heard from us regarding the results in 2 weeks, please contact this office.     

## 2018-05-23 NOTE — Progress Notes (Signed)
Linda Atkinson  MRN: [id number] DOB: 09/20/1946  PCP: Mancel Bale, PA-C  Chief Complaint  Patient presents with  . thyroid check    follow up     Subjective:  Pt presents to clinic for labs for her thyroid.  She has not felt this tired since restarting her Synthroid about 5-1/2 weeks ago.   Sleep - tried melatonin XR and it helped for about 3 days but now it is not helping -- she is really tired in the afternoon - and then she does not sleep the next night .  She wonders if some of this is her thyroid and would like to know what her labs are before the next medication is tried, she is really sure she wants to try medications for her insomnia.  History is obtained by patient.  Review of Systems  Constitutional: Positive for fatigue.  Psychiatric/Behavioral: Positive for sleep disturbance.    Patient Active Problem List   Diagnosis Date Noted  . Rheumatoid arthritis ([place]) 06/18/2017  . Insomnia 08/01/2016  . Diabetes type 2, controlled ([place]) 04/19/2014  . Tobacco abuse 04/18/2014  . Arthralgia 06/29/2013  . Hyperparathyroidism ([place]) 06/11/2013  . Hypothyroidism (acquired) 06/11/2013    Current Outpatient Medications on File Prior to Visit  Medication Sig Dispense Refill  . ibuprofen (ADVIL,MOTRIN) 200 MG tablet Take 200 mg by mouth every 6 (six) hours as needed for pain.    Marland Kitchen SYNTHROID 150 MCG tablet TAKE ONE TABLET BY MOUTH EVERY MORNING WITH BREAKFAST 30 tablet 1  . Blood Glucose Monitoring Suppl (BLOOD GLUCOSE METER KIT AND SUPPLIES) KIT One touch ultra 2/ use once daily to check blood sugar. (FOR ICD-9 250.00, (Patient not taking: Reported on 04/08/2018) 1 each 0  . OVER THE COUNTER MEDICATION      No current facility-administered medications on file prior to visit.     Allergies  Allergen Reactions  . Lipitor [Atorvastatin] Other (See Comments)    Muscle pain.    Past Medical History:  Diagnosis Date  . Allergy   . Anxiety    about surgery  . Arthritis     . Cancer ([place]) 1972   "carcinoma in situ in pap smear"  . Complication of anesthesia    Sept. 26 2013: "blood pressure dropped"   . Depression    moderate  . GERD (gastroesophageal reflux disease) 1996   hx of  . Pneumonia 2009   hx of  . Rheumatoid arthritis ([place])   . Shortness of breath    with walking  . Thyroid disease    Social History   Social History Narrative   Lives alone         Eating mainly raw foods   walks 5 miles  day   Social History   Tobacco Use  . Smoking status: Current Every Day Smoker    Packs/day: 0.25    Years: 10.00    Pack years: 2.50    Types: Cigarettes  . Smokeless tobacco: Never Used  Substance Use Topics  . Alcohol use: No    Alcohol/week: 0.0 oz  . Drug use: No   family history includes Alzheimer's disease in her mother; Cancer in her brother, maternal aunt, paternal aunt, and paternal grandmother; Diabetes in her father; Heart disease in her father; Hyperlipidemia in her father; Kidney disease in her father.     Objective:  BP 128/78   Pulse 79   Temp 98.8 F (37.1 C) (Oral)   Resp 18  Ht 5' 6" (1.676 m)   SpO2 96%   BMI 32.41 kg/m  Body mass index is 32.41 kg/m.  Wt Readings from Last 3 Encounters:  06/18/17 200 lb 12.8 oz (91.1 kg)  12/20/16 208 lb (94.3 kg)  08/01/16 208 lb 14.4 oz (94.8 kg)    Physical Exam  Constitutional: She is oriented to person, place, and time.  HENT:  Head: Normocephalic and atraumatic.  Right Ear: Hearing and external ear normal.  Left Ear: Hearing and external ear normal.  Eyes: Conjunctivae are normal.  Neck: Normal range of motion.  Cardiovascular: Normal rate, regular rhythm and normal heart sounds.  No murmur heard. Pulmonary/Chest: Effort normal and breath sounds normal. She has no wheezes.  Neurological: She is alert and oriented to person, place, and time.  Skin: Skin is warm and dry.  Psychiatric: Judgment normal.  Vitals reviewed.   Assessment and Plan :   Hypothyroidism (acquired) - Plan: TSH -check labs -adjust medications as needed  Elevated BP without diagnosis of hypertension - Plan: CMP14+EGFR, Lipid panel, EKG 12-Lead -patient will have EKG done at next follow-up visit  Fatigue, unspecified type - Plan: CBC with Differential/Platelet, VITAMIN D 25 Hydroxy (Vit-D Deficiency, Fractures)  Smoker -encouraged to quit  Patient verbalized to me that they understand the following: diagnosis, what is being done for them, what to expect and what should be done at home.  Their questions have been answered.  See after visit summary for patient specific instructions.  Windell Hummingbird PA-C  [facility] 05/26/2018 11:49 AM  Please note: Portions of this report may have been transcribed using dragon voice recognition software. Every effort was made to ensure accuracy; however, inadvertent computerized transcription errors may be present.

## 2018-05-24 LAB — CMP14+EGFR
ALT: 13 IU/L (ref 0–32)
AST: 12 IU/L (ref 0–40)
Albumin/Globulin Ratio: 2 (ref 1.2–2.2)
Albumin: 4.5 g/dL (ref 3.5–4.8)
Alkaline Phosphatase: 84 IU/L (ref 39–117)
BILIRUBIN TOTAL: 0.7 mg/dL (ref 0.0–1.2)
BUN/Creatinine Ratio: 12 (ref 12–28)
BUN: 8 mg/dL (ref 8–27)
CO2: 20 mmol/L (ref 20–29)
Calcium: 10.6 mg/dL — ABNORMAL HIGH (ref 8.7–10.3)
Chloride: 102 mmol/L (ref 96–106)
Creatinine, Ser: 0.65 mg/dL (ref 0.57–1.00)
GFR calc Af Amer: 103 mL/min/{1.73_m2} (ref >59)
GFR calc non Af Amer: 90 mL/min/{1.73_m2} (ref >59)
Globulin, Total: 2.2 g/dL (ref 1.5–4.5)
Glucose: 113 mg/dL — ABNORMAL HIGH (ref 65–99)
Potassium: 4.3 mmol/L (ref 3.5–5.2)
Sodium: 136 mmol/L (ref 134–144)
Total Protein: 6.7 g/dL (ref 6.0–8.5)

## 2018-05-24 LAB — CBC WITH DIFFERENTIAL/PLATELET
BASOS ABS: 0.1 10*3/uL (ref 0.0–0.2)
BASOS: 1 %
EOS (ABSOLUTE): 0.2 10*3/uL (ref 0.0–0.4)
EOS: 3 %
HEMOGLOBIN: 14.2 g/dL (ref 11.1–15.9)
Hematocrit: 42.7 % (ref 34.0–46.6)
Immature Grans (Abs): 0 10*3/uL (ref 0.0–0.1)
Immature Granulocytes: 0 %
LYMPHS ABS: 2.5 10*3/uL (ref 0.7–3.1)
Lymphs: 40 %
MCH: 28.8 pg (ref 26.6–33.0)
MCHC: 33.3 g/dL (ref 31.5–35.7)
MCV: 87 fL (ref 79–97)
MONOS ABS: 0.4 10*3/uL (ref 0.1–0.9)
Monocytes: 6 %
NEUTROS ABS: 3.1 10*3/uL (ref 1.4–7.0)
Neutrophils: 50 %
Platelets: 192 10*3/uL (ref 150–450)
RBC: 4.93 x10E6/uL (ref 3.77–5.28)
RDW: 13.6 % (ref 12.3–15.4)
WBC: 6.3 10*3/uL (ref 3.4–10.8)

## 2018-05-24 LAB — LIPID PANEL
CHOL/HDL RATIO: 5.1 ratio — AB (ref 0.0–4.4)
Cholesterol, Total: 209 mg/dL — ABNORMAL HIGH (ref 100–199)
HDL: 41 mg/dL (ref >39)
LDL CALC: 135 mg/dL — AB (ref 0–99)
Triglycerides: 167 mg/dL — ABNORMAL HIGH (ref 0–149)
VLDL CHOLESTEROL CAL: 33 mg/dL (ref 5–40)

## 2018-05-24 LAB — TSH: TSH: 0.328 u[IU]/mL — AB (ref 0.450–4.500)

## 2018-05-24 LAB — VITAMIN D 25 HYDROXY (VIT D DEFICIENCY, FRACTURES): VIT D 25 HYDROXY: 12.8 ng/mL — AB (ref 30.0–100.0)

## 2018-05-26 ENCOUNTER — Encounter: Payer: Self-pay | Admitting: Physician Assistant

## 2018-05-28 ENCOUNTER — Encounter: Payer: Self-pay | Admitting: Physician Assistant

## 2018-05-29 ENCOUNTER — Encounter: Payer: Self-pay | Admitting: Physician Assistant

## 2018-05-31 LAB — HGB A1C W/O EAG: HEMOGLOBIN A1C: 5.9 % — AB (ref 4.8–5.6)

## 2018-05-31 LAB — SPECIMEN STATUS REPORT

## 2018-06-02 ENCOUNTER — Other Ambulatory Visit: Payer: Self-pay | Admitting: Physician Assistant

## 2018-06-02 DIAGNOSIS — E039 Hypothyroidism, unspecified: Secondary | ICD-10-CM

## 2018-06-03 ENCOUNTER — Telehealth: Payer: Self-pay | Admitting: Physician Assistant

## 2018-06-03 DIAGNOSIS — E039 Hypothyroidism, unspecified: Secondary | ICD-10-CM

## 2018-06-03 MED ORDER — SYNTHROID 150 MCG PO TABS: ORAL_TABLET | ORAL | 1 refills | Status: DC

## 2018-06-03 NOTE — Telephone Encounter (Signed)
Pt did not receive Vitamin D; contact pt if needed

## 2018-06-03 NOTE — Telephone Encounter (Signed)
Copied from [place] [id number]. Topic: Quick Communication - Rx Refill/Question >> Jun 03, 2018  9:33 AM Burchel, Abbi R wrote: Medication: SYNTHROID 150 MCG tablet and   Vitamind D Supp.  Preferred Pharmacy: [facility], [address] [place] [address] Phone: [phone] Fax: [phone]   Pt requesting these meds per MyChart correspondence.

## 2018-06-04 NOTE — Telephone Encounter (Signed)
Please advise...the patient did not get Vitamin D supp and is not on list

## 2018-06-05 ENCOUNTER — Telehealth: Payer: Self-pay | Admitting: Physician Assistant

## 2018-06-05 NOTE — Telephone Encounter (Signed)
pls advise

## 2018-06-05 NOTE — Telephone Encounter (Signed)
Copied from [place] [id number]. Topic: General - Other >> Jun 05, 2018  9:33 AM Lennox Solders wrote: Reason for CRM: pt is calling and per lab result note 05-23-18 Judson Roch was going to send 50000 unit vit d for the patient to take once weekly for 12 wk. [facility] on [address]. Pt would like to pick up medication today

## 2018-06-06 NOTE — Telephone Encounter (Signed)
Patient calling and is very upset that the vit D has not been sent to the pharmacy yet. Advised her that Judson Roch has been out of the office since Wednesday. Patient states that she has been a patient at [facility] since 1999 and that she needs this medication today to help her feel better. Advised her that I would send a message and see if another provider is able to send this medication to the pharmacy in [place] absence. [place], [place]

## 2018-06-07 MED ORDER — VITAMIN D (ERGOCALCIFEROL) 1.25 MG (50000 UNIT) PO CAPS: 50000.0000 [IU] | ORAL_CAPSULE | ORAL | 2 refills | Status: DC

## 2018-06-07 NOTE — Telephone Encounter (Signed)
Sent to pharmacy 

## 2018-06-07 NOTE — Addendum Note (Signed)
Addended by: Mancel Bale on: 06/07/2018 08:33 AM   Modules accepted: Orders

## 2018-06-18 ENCOUNTER — Telehealth: Payer: Self-pay | Admitting: Physician Assistant

## 2018-06-18 ENCOUNTER — Ambulatory Visit: Payer: Self-pay | Admitting: *Deleted

## 2018-06-18 NOTE — Telephone Encounter (Signed)
Patient woke up feeling lightheaded Sunday morning upon getting out of bed. The feeling is not constant but noted when she standing from sitting. Denies vertigo/fever/CP/SOB. Has some nausea when this occurs that resolves quickly. Stated this feeling improves through out the morning. B/P now 160/87 p.72. Reviewed sx that would require emergency treatment. Stated understanding. Appointment made for tomorrow.   Reason for Disposition . [1] MILD dizziness (e.g., walking normally) AND [2] has NOT been evaluated by physician for this  (Exception: dizziness caused by heat exposure, sudden standing, or poor fluid intake)  Answer Assessment - Initial Assessment Questions 1. DESCRIPTION: "Describe your dizziness."     Lightheaded, feels like she is staggering when she stands up to walk.  2. LIGHTHEADED: "Do you feel lightheaded?" (e.g., somewhat faint, woozy, weak upon standing)    Upon standing only. 3. VERTIGO: "Do you feel like either you or the room is spinning or tilting?" (i.e. vertigo)    no 4. SEVERITY: "How bad is it?"  "Do you feel like you are going to faint?" "Can you stand and walk?"   - MILD - walking normally   - MODERATE - interferes with normal activities (e.g., work, school)    - SEVERE - unable to stand, requires support to walk, feels like passing out now.      Mild, able to go about daily activities.  5. ONSET:  "When did the dizziness begin?"     Sunday morning upon awakening  6. AGGRAVATING FACTORS: "Does anything make it worse?" (e.g., standing, change in head position)     Upon standing. 7. HEART RATE: "Can you tell me your heart rate?" "How many beats in 15 seconds?"  (Note: not all patients can do this)        8. CAUSE: "What do you think is causing the dizziness?"     unsure 9. RECURRENT SYMPTOM: "Have you had dizziness before?" If so, ask: "When was the last time?" "What happened that time?"     no 10. OTHER SYMPTOMS: "Do you have any other symptoms?" (e.g., fever,  chest pain, vomiting, diarrhea, bleeding)       no 11. PREGNANCY: "Is there any chance you are pregnant?" "When was your last menstrual period?"       no  Protocols used: DIZZINESS [facility]

## 2018-06-18 NOTE — Telephone Encounter (Signed)
LVM to confirm appt with Weber tomorrow at [facility]

## 2018-06-19 ENCOUNTER — Encounter: Payer: Self-pay | Admitting: Physician Assistant

## 2018-06-19 ENCOUNTER — Other Ambulatory Visit: Payer: Self-pay

## 2018-06-19 ENCOUNTER — Ambulatory Visit: Payer: PPO | Admitting: Physician Assistant

## 2018-06-19 ENCOUNTER — Ambulatory Visit (INDEPENDENT_AMBULATORY_CARE_PROVIDER_SITE_OTHER): Payer: PPO | Admitting: Physician Assistant

## 2018-06-19 VITALS — BP 148/98 | HR 79 | Temp 98.7°F | Resp 20 | Ht 67.32 in

## 2018-06-19 DIAGNOSIS — R42 Dizziness and giddiness: Secondary | ICD-10-CM

## 2018-06-19 DIAGNOSIS — R110 Nausea: Secondary | ICD-10-CM

## 2018-06-19 MED ORDER — ONDANSETRON 4 MG PO TBDP: 4.0000 mg | ORAL_TABLET | Freq: Three times a day (TID) | ORAL | 0 refills | Status: DC | PRN

## 2018-06-19 MED ORDER — MECLIZINE HCL 25 MG PO TABS
25.0000 mg | ORAL_TABLET | Freq: Three times a day (TID) | ORAL | 0 refills | Status: DC | PRN
Start: 2018-06-19 — End: 2019-12-22

## 2018-06-19 NOTE — Patient Instructions (Signed)
     I will contact you with your lab results within the next 2 weeks.  If you have not heard from us then please contact us. The fastest way to get your results is to register for My Chart.   IF you received an x-ray today, you will receive an invoice from [facility]. Please contact [facility] at [phone] with questions or concerns regarding your invoice.   IF you received labwork today, you will receive an invoice from [facility]. Please contact [facility] at [phone] with questions or concerns regarding your invoice.   Our billing staff will not be able to assist you with questions regarding bills from these companies.  You will be contacted with the lab results as soon as they are available. The fastest way to get your results is to activate your My Chart account. Instructions are located on the last page of this paperwork. If you have not heard from us regarding the results in 2 weeks, please contact this office.     

## 2018-06-19 NOTE — Progress Notes (Signed)
Linda Atkinson  MRN: [id number] DOB: 1946/09/26  PCP: Mancel Bale, PA-C  Chief Complaint  Patient presents with  . Nausea    X 4 days off and on  . Dizziness    X 4 days off and on    Subjective:  Pt presents to clinic for intermittent dizziness that seems to be positional.  Worried she has to travel next week for work. No recent colds, headaches hearing changes, no changes in her tinnitus.  Thought it might be her blood pressure to begin with and has readings from at home.  No paresthesias or extremity weakness.  Nausea when dizziness gets bad.  The worst position is laying down she feels like she is falling off the bed.  147/89 HR 60 150/84 HR 62  History is obtained by patient.  Review of Systems  HENT: Negative.   Gastrointestinal: Positive for nausea.  Neurological: Positive for dizziness. Negative for weakness, light-headedness and headaches.    Patient Active Problem List   Diagnosis Date Noted  . Rheumatoid arthritis ([place]) 06/18/2017  . Insomnia 08/01/2016  . Diabetes type 2, controlled ([place]) 04/19/2014  . Tobacco abuse 04/18/2014  . Arthralgia 06/29/2013  . Hyperparathyroidism ([place]) 06/11/2013  . Hypothyroidism (acquired) 06/11/2013    Current Outpatient Medications on File Prior to Visit  Medication Sig Dispense Refill  . Blood Glucose Monitoring Suppl (BLOOD GLUCOSE METER KIT AND SUPPLIES) KIT One touch ultra 2/ use once daily to check blood sugar. (FOR ICD-9 250.00, 1 each 0  . ibuprofen (ADVIL,MOTRIN) 200 MG tablet Take 200 mg by mouth every 6 (six) hours as needed for pain.    Marland Kitchen OVER THE COUNTER MEDICATION     . SYNTHROID 150 MCG tablet TAKE ONE TABLET BY MOUTH EVERY MORNING WITH BREAKFAST 30 tablet 1  . Vitamin D, Ergocalciferol, (DRISDOL) 50000 units CAPS capsule Take 1 capsule (50,000 Units total) by mouth every 7 (seven) days. 4 capsule 2   No current facility-administered medications on file prior to visit.     Allergies  Allergen  Reactions  . Lipitor [Atorvastatin] Other (See Comments)    Muscle pain.    Past Medical History:  Diagnosis Date  . Allergy   . Anxiety    about surgery  . Arthritis   . Cancer ([place]) 1972   "carcinoma in situ in pap smear"  . Complication of anesthesia    Sept. 26 2013: "blood pressure dropped"   . Depression    moderate  . GERD (gastroesophageal reflux disease) 1996   hx of  . Pneumonia 2009   hx of  . Rheumatoid arthritis ([place])   . Shortness of breath    with walking  . Thyroid disease    Social History   Social History Narrative   Lives alone         Eating mainly raw foods   walks 5 miles  day   Social History   Tobacco Use  . Smoking status: Current Every Day Smoker    Packs/day: 0.25    Years: 10.00    Pack years: 2.50    Types: Cigarettes  . Smokeless tobacco: Never Used  Substance Use Topics  . Alcohol use: No    Alcohol/week: 0.0 standard drinks  . Drug use: No   family history includes Alzheimer's disease in her mother; Cancer in her brother, maternal aunt, paternal aunt, and paternal grandmother; Diabetes in her father; Heart disease in her father; Hyperlipidemia in her father; Kidney disease  in her father.     Objective:  BP (!) 148/98   Pulse 79   Temp 98.7 F (37.1 C) (Oral)   Resp 20   Ht 5' 7.32" (1.71 m)   SpO2 96%   BMI 31.15 kg/m  Body mass index is 31.15 kg/m.  Wt Readings from Last 3 Encounters:  06/18/17 200 lb 12.8 oz (91.1 kg)  12/20/16 208 lb (94.3 kg)  08/01/16 208 lb 14.4 oz (94.8 kg)    Physical Exam  Constitutional: She is oriented to person, place, and time. She appears well-developed and well-nourished.  HENT:  Head: Normocephalic and atraumatic.  Right Ear: Hearing and external ear normal.  Left Ear: Hearing and external ear normal.  Eyes: Pupils are equal, round, and reactive to light. Conjunctivae, EOM and lids are normal. Right eye exhibits no nystagmus. Left eye exhibits no nystagmus.  Neck: Normal  range of motion.  Cardiovascular: Normal rate, regular rhythm and normal heart sounds.  No murmur heard. Pulmonary/Chest: Effort normal and breath sounds normal. She has no wheezes.  Neurological: She is alert and oriented to person, place, and time. She has normal strength and normal reflexes. No cranial nerve deficit or sensory deficit. She displays a negative Romberg sign.  Skin: Skin is warm and dry.  Psychiatric: Judgment normal.  Vitals reviewed.   Assessment and Plan :  Vertigo - Plan: meclizine (ANTIVERT) 25 MG tablet  Nausea without vomiting - Plan: ondansetron (ZOFRAN ODT) 4 MG disintegrating tablet  Patient did not want to attempt Epley maneuvers due to extreme vertigo while laying down.  Will treat symptoms with Antivert and Zofran.  Discussed home modified Epley maneuvers that patient can do.  She will follow-up with me if this does not get better for lab work and further evaluation.  Patient verbalized to me that they understand the following: diagnosis, what is being done for them, what to expect and what should be done at home.  Their questions have been answered.  See after visit summary for patient specific instructions.  Sarah Weber PA-C  [facility] 06/20/2018 6:23 PM  Please note: Portions of this report may have been transcribed using dragon voice recognition software. Every effort was made to ensure accuracy; however, inadvertent computerized transcription errors may be present.  

## 2018-06-20 ENCOUNTER — Encounter: Payer: Self-pay | Admitting: Physician Assistant

## 2018-07-02 ENCOUNTER — Encounter: Payer: Self-pay | Admitting: Physician Assistant

## 2018-07-04 ENCOUNTER — Ambulatory Visit: Payer: PPO | Admitting: Physician Assistant

## 2018-08-19 DIAGNOSIS — E559 Vitamin D deficiency, unspecified: Secondary | ICD-10-CM | POA: Diagnosis not present

## 2018-08-19 DIAGNOSIS — E039 Hypothyroidism, unspecified: Secondary | ICD-10-CM | POA: Diagnosis not present

## 2018-08-19 DIAGNOSIS — Z9009 Acquired absence of other part of head and neck: Secondary | ICD-10-CM | POA: Diagnosis not present

## 2018-09-25 ENCOUNTER — Other Ambulatory Visit (HOSPITAL_COMMUNITY): Payer: Self-pay | Admitting: Physician Assistant

## 2018-09-25 DIAGNOSIS — R7989 Other specified abnormal findings of blood chemistry: Secondary | ICD-10-CM

## 2018-10-03 ENCOUNTER — Encounter (HOSPITAL_COMMUNITY): Payer: Medicare Other

## 2018-10-22 ENCOUNTER — Encounter (HOSPITAL_COMMUNITY): Payer: PPO | Attending: Physician Assistant

## 2018-10-22 ENCOUNTER — Encounter (HOSPITAL_COMMUNITY): Admission: RE | Admit: 2018-10-22 | Payer: PPO | Source: Ambulatory Visit

## 2018-10-22 ENCOUNTER — Encounter (HOSPITAL_COMMUNITY): Payer: Self-pay

## 2018-12-17 ENCOUNTER — Telehealth: Payer: Self-pay | Admitting: *Deleted

## 2018-12-17 NOTE — Telephone Encounter (Signed)
Left detailed message to schedule AWV if she is still a patient with PCP

## 2019-01-01 DIAGNOSIS — R030 Elevated blood-pressure reading, without diagnosis of hypertension: Secondary | ICD-10-CM | POA: Diagnosis not present

## 2019-01-01 DIAGNOSIS — H6123 Impacted cerumen, bilateral: Secondary | ICD-10-CM | POA: Diagnosis not present

## 2019-01-01 DIAGNOSIS — H900 Conductive hearing loss, bilateral: Secondary | ICD-10-CM | POA: Diagnosis not present

## 2019-07-10 DIAGNOSIS — J40 Bronchitis, not specified as acute or chronic: Secondary | ICD-10-CM | POA: Diagnosis not present

## 2019-07-10 DIAGNOSIS — E039 Hypothyroidism, unspecified: Secondary | ICD-10-CM | POA: Diagnosis not present

## 2019-09-07 DIAGNOSIS — E213 Hyperparathyroidism, unspecified: Secondary | ICD-10-CM | POA: Diagnosis not present

## 2019-09-07 DIAGNOSIS — E039 Hypothyroidism, unspecified: Secondary | ICD-10-CM | POA: Diagnosis not present

## 2019-09-07 DIAGNOSIS — E119 Type 2 diabetes mellitus without complications: Secondary | ICD-10-CM | POA: Diagnosis not present

## 2019-09-07 DIAGNOSIS — M069 Rheumatoid arthritis, unspecified: Secondary | ICD-10-CM | POA: Diagnosis not present

## 2019-09-08 DIAGNOSIS — R05 Cough: Secondary | ICD-10-CM | POA: Diagnosis not present

## 2019-09-08 DIAGNOSIS — E213 Hyperparathyroidism, unspecified: Secondary | ICD-10-CM | POA: Diagnosis not present

## 2019-09-08 DIAGNOSIS — R5383 Other fatigue: Secondary | ICD-10-CM | POA: Diagnosis not present

## 2019-09-08 DIAGNOSIS — E039 Hypothyroidism, unspecified: Secondary | ICD-10-CM | POA: Diagnosis not present

## 2019-09-08 DIAGNOSIS — Z7689 Persons encountering health services in other specified circumstances: Secondary | ICD-10-CM | POA: Diagnosis not present

## 2019-09-08 DIAGNOSIS — E119 Type 2 diabetes mellitus without complications: Secondary | ICD-10-CM | POA: Diagnosis not present

## 2019-09-08 DIAGNOSIS — E559 Vitamin D deficiency, unspecified: Secondary | ICD-10-CM | POA: Diagnosis not present

## 2019-09-08 DIAGNOSIS — M069 Rheumatoid arthritis, unspecified: Secondary | ICD-10-CM | POA: Diagnosis not present

## 2019-09-15 ENCOUNTER — Other Ambulatory Visit (HOSPITAL_COMMUNITY): Payer: Self-pay | Admitting: Family Medicine

## 2019-09-15 DIAGNOSIS — E213 Hyperparathyroidism, unspecified: Secondary | ICD-10-CM

## 2019-09-30 ENCOUNTER — Ambulatory Visit (HOSPITAL_COMMUNITY): Payer: PPO

## 2019-09-30 ENCOUNTER — Encounter (HOSPITAL_COMMUNITY): Payer: Self-pay

## 2019-09-30 ENCOUNTER — Encounter (HOSPITAL_COMMUNITY): Payer: PPO

## 2019-10-19 ENCOUNTER — Encounter: Payer: Self-pay | Admitting: Rheumatology

## 2019-10-19 NOTE — Progress Notes (Signed)
Virtual Visit via Video Note  I connected with Linda Atkinson on 10/20/19 at  8:15 AM EST by a video enabled telemedicine application and verified that I am speaking with the correct person using two identifiers.  Location: Patient: Home  Provider: Clinic  This service was conducted via virtual visit.  Both audio and visual tools were used.  The patient was located at home. I was located in my office.  Consent was obtained prior to the virtual visit and is aware of possible charges through their insurance for this visit.  The patient is an established patient.  Dr. Estanislado Pandy, MD conducted the virtual visit and Hazel Sams, PA-C acted as scribe during the service.  Office staff helped with scheduling follow up visits after the service was conducted.     I discussed the limitations of evaluation and management by telemedicine and the availability of in person appointments. The patient expressed understanding and agreed to proceed.  CC: History of Present Illness: Patient is a 73 year old female with history of osteoarthritis.  Patient has been seen in consultation per request of her PCP for positive rheumatoid factor.  According to patient she was seen by me in 2014 for pain in multiple joints.  I reviewed her old records today.  At the time his anti-CCP antibody was positive.  The clinical findings were consistent with osteoarthritis and x-rays were unremarkable.  Patient states over the years she took Aleve on as needed basis.  She states in September 2020 she developed pneumonia at the time her labs showed positive rheumatoid factor.  In October 2020, her joint pain got worse with increased pain and discomfort.  She states she has been having pain and discomfort in her bilateral thumbs which she describes over the bilateral first PIP joints.  She also has pain in bilateral wrists, bilateral elbows and bilateral shoulders.  She states her right shoulder is more painful than the left shoulder.  She also  has some discomfort in her knees and feet.  She does not see any obvious swelling but has chronic discomfort.  There is positive history of rheumatoid arthritis in her cousin.  Review of Systems  Constitutional: Positive for malaise/fatigue. Negative for fever.  HENT:       Denies oral or nasal ulcerations  +Dry mouth  Eyes: Negative for photophobia, pain, discharge and redness.       +Dry eyes  Respiratory: Negative for cough, shortness of breath and wheezing.   Cardiovascular: Negative for chest pain and palpitations.  Gastrointestinal: Negative for blood in stool, constipation and diarrhea.  Genitourinary: Negative for dysuria.  Musculoskeletal: Positive for joint pain. Negative for back pain, myalgias and neck pain.  Skin: Negative for rash.       Denies symptoms of Raynaud's  Neurological: Negative for dizziness and headaches.  Psychiatric/Behavioral: Positive for depression. The patient is nervous/anxious and has insomnia.       Observations/Objective: Physical Exam  Constitutional: She is oriented to person, place, and time and well-developed, well-nourished, and in no distress.  HENT:  Head: Normocephalic and atraumatic.  Eyes: Conjunctivae are normal.  Pulmonary/Chest: Effort normal.  Neurological: She is alert and oriented to person, place, and time.  Psychiatric: Mood, memory, affect and judgment normal.   Patient reports morning stiffness for 1 hour.   Patient reports nocturnal pain.  Difficulty dressing/grooming: Denies Difficulty climbing stairs: Denies Difficulty getting out of chair: Denies Difficulty using hands for taps, buttons, cutlery, and/or writing: Reports  Labs done  at [facility] on September 08, 2019 rheumatoid factor I 26.1, CBC normal, CMP normal, PTH 78, TSH normal, calcium 10.7  Assessment and Plan: Diagnoses and all orders for this visit:  Diagnoses and all orders for this visit:  Pain in both hands- Patient gives history of pain and discomfort  in her hands for last several years and the pain has been worse in the last few months.  She was evaluated by me in 2014 at that time the clinical findings are consistent with osteoarthritis.  Although she had positive anti-CCP.  She recently was found to have elevated rheumatoid factor.  I suspect that she may have rheumatoid arthritis.  We will bring her back in the office to get x-rays and examine her to establish the diagnosis.  I discussed possible use of Plaquenil at the follow-up visit. -     ESR -     CCP -     14-3-3 eta Protein -     ANA  Chronic pain of both shoulders- Patient complains of pain and discomfort in her bilateral shoulders for many years.  She also has nocturnal pain.  Polyarthralgia- She complains of pain and discomfort in multiple joints involving her bilateral shoulders, bilateral elbows, bilateral wrists, bilateral knee joints, bilateral feet.  Rheumatoid factor positive- She has significantly positive rheumatoid factor.  High risk medication use- In anticipation of future treatment I will obtain following labs.  Patient stated that she will get lab work this week. Comments: Plaquenil  Orders: -     Immunoglobulins; Future -     HIV antibody; Future -     QuantiFERON-TB Gold Plus; Future -     Serum protein electrophoresis with reflex; Future -     G6PD; Future -     Hepatitis B core antibody, IgM; Future -     Hepatitis B surface antigen; Future -     Hepatitis C antibody; Future  Primary osteoarthritis of both hands- She has history of osteoarthritis and her previous x-rays were consistent with osteoarthritis.  She also sent some pictures on the MyChart app which shows osteoarthritic changes in her hands.  Primary osteoarthritis of both feet- She has osteoarthritis in her feet on previous x-rays.  Other fatigue -     CK (Creatine Kinase)  Vitamin D deficiency- Patient had severe vitamin D deficiency in the past.  She states she has been taking  vitamin D.  Other medical problems are listed as follows:  Hyperparathyroidism ([place])  Hypothyroidism (acquired)  Controlled type 2 diabetes mellitus without complication, without long-term current use of insulin ([place])  Tobacco abuse  Other insomnia       Follow Up Instructions: She will follow up in    I discussed the assessment and treatment plan with the patient. The patient was provided an opportunity to ask questions and all were answered. The patient agreed with the plan and demonstrated an understanding of the instructions.   The patient was advised to call back or seek an in-person evaluation if the symptoms worsen or if the condition fails to improve as anticipated.  I provided 45 minutes of non-face-to-face time during this encounter.   Bo Merino, MD

## 2019-10-20 ENCOUNTER — Other Ambulatory Visit: Payer: Self-pay

## 2019-10-20 ENCOUNTER — Telehealth (INDEPENDENT_AMBULATORY_CARE_PROVIDER_SITE_OTHER): Payer: PPO | Admitting: Rheumatology

## 2019-10-20 ENCOUNTER — Encounter: Payer: Self-pay | Admitting: Rheumatology

## 2019-10-20 DIAGNOSIS — R5383 Other fatigue: Secondary | ICD-10-CM | POA: Diagnosis not present

## 2019-10-20 DIAGNOSIS — G8929 Other chronic pain: Secondary | ICD-10-CM

## 2019-10-20 DIAGNOSIS — Z79899 Other long term (current) drug therapy: Secondary | ICD-10-CM

## 2019-10-20 DIAGNOSIS — M2550 Pain in unspecified joint: Secondary | ICD-10-CM

## 2019-10-20 DIAGNOSIS — M79642 Pain in left hand: Secondary | ICD-10-CM | POA: Diagnosis not present

## 2019-10-20 DIAGNOSIS — M25512 Pain in left shoulder: Secondary | ICD-10-CM | POA: Diagnosis not present

## 2019-10-20 DIAGNOSIS — R768 Other specified abnormal immunological findings in serum: Secondary | ICD-10-CM | POA: Diagnosis not present

## 2019-10-20 DIAGNOSIS — M79641 Pain in right hand: Secondary | ICD-10-CM

## 2019-10-20 DIAGNOSIS — M19042 Primary osteoarthritis, left hand: Secondary | ICD-10-CM | POA: Diagnosis not present

## 2019-10-20 DIAGNOSIS — E213 Hyperparathyroidism, unspecified: Secondary | ICD-10-CM

## 2019-10-20 DIAGNOSIS — M19071 Primary osteoarthritis, right ankle and foot: Secondary | ICD-10-CM | POA: Diagnosis not present

## 2019-10-20 DIAGNOSIS — M19041 Primary osteoarthritis, right hand: Secondary | ICD-10-CM

## 2019-10-20 DIAGNOSIS — M25511 Pain in right shoulder: Secondary | ICD-10-CM

## 2019-10-20 DIAGNOSIS — M19072 Primary osteoarthritis, left ankle and foot: Secondary | ICD-10-CM

## 2019-10-20 DIAGNOSIS — E119 Type 2 diabetes mellitus without complications: Secondary | ICD-10-CM

## 2019-10-20 DIAGNOSIS — Z720 Tobacco use: Secondary | ICD-10-CM

## 2019-10-20 DIAGNOSIS — M069 Rheumatoid arthritis, unspecified: Secondary | ICD-10-CM

## 2019-10-20 DIAGNOSIS — E559 Vitamin D deficiency, unspecified: Secondary | ICD-10-CM

## 2019-10-20 DIAGNOSIS — G4709 Other insomnia: Secondary | ICD-10-CM

## 2019-10-20 DIAGNOSIS — E039 Hypothyroidism, unspecified: Secondary | ICD-10-CM

## 2019-10-20 NOTE — Addendum Note (Signed)
Addended by: Earnestine Mealing on: 10/20/2019 03:58 PM   Modules accepted: Orders

## 2019-11-02 DIAGNOSIS — R768 Other specified abnormal immunological findings in serum: Secondary | ICD-10-CM | POA: Diagnosis not present

## 2019-11-02 DIAGNOSIS — M2550 Pain in unspecified joint: Secondary | ICD-10-CM | POA: Diagnosis not present

## 2019-11-02 DIAGNOSIS — M79641 Pain in right hand: Secondary | ICD-10-CM | POA: Diagnosis not present

## 2019-11-02 DIAGNOSIS — M79642 Pain in left hand: Secondary | ICD-10-CM | POA: Diagnosis not present

## 2019-11-03 NOTE — Progress Notes (Signed)
CK and sed rate are within normal limits.

## 2019-11-03 NOTE — Progress Notes (Signed)
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  Patient has been experiencing increased pain and discomfort in her hands.  Please schedule an in office appointment to examine and discuss treatment plan.

## 2019-11-05 LAB — 14-3-3 ETA PROTEIN: 14-3-3 eta Protein: 7.7 ng/mL — ABNORMAL HIGH (ref <0.2)

## 2019-11-05 LAB — SEDIMENTATION RATE: Sed Rate: 6 mm/h (ref 0–30)

## 2019-11-05 LAB — CK: Total CK: 35 U/L (ref 29–143)

## 2019-11-05 LAB — ANA: Anti Nuclear Antibody (ANA): NEGATIVE

## 2019-11-05 LAB — CYCLIC CITRUL PEPTIDE ANTIBODY, IGG: Cyclic Citrullin Peptide Ab: >250 UNITS — ABNORMAL HIGH

## 2019-11-16 NOTE — Progress Notes (Signed)
Office Visit Note  Patient: Linda Atkinson             Date of Birth: [birth year]           MRN: [id number]             PCP: Default, Provider, MD Referring: No ref. provider found Visit Date: 11/17/2019 Occupation: '@GUAROCC' @  Subjective:  Discuss lab work   History of Present Illness: Linda Atkinson is a 74 y.o. female with history of seropositive rheumatoid arthritis and osteoarthritis.  She states she has severe generalized myalgias and arthralgias. She has pain in both hands and both wrist joints. She has noticed some swelling in both wrist joints intermittently.  She is having pain in both shoulder joints. She has been experiencing significant fatigue. She has been less active during the covid-19 pandemic.    Activities of Daily Living:  Patient reports morning stiffness for several hours.   Patient Reports nocturnal pain.  Difficulty dressing/grooming: Denies Difficulty climbing stairs: Denies Difficulty getting out of chair: Denies Difficulty using hands for taps, buttons, cutlery, and/or writing: Reports  Review of Systems  Constitutional: Positive for fatigue.  HENT: Negative for mouth sores, mouth dryness and nose dryness.   Eyes: Negative for pain, itching, visual disturbance and dryness.  Respiratory: Positive for shortness of breath. Negative for cough, hemoptysis and difficulty breathing.   Cardiovascular: Negative for chest pain, palpitations, hypertension and swelling in legs/feet.  Gastrointestinal: Negative for blood in stool, constipation and diarrhea.  Endocrine: Negative for increased urination.  Genitourinary: Negative for difficulty urinating and painful urination.  Musculoskeletal: Positive for arthralgias, joint pain, joint swelling and morning stiffness. Negative for myalgias, muscle weakness, muscle tenderness and myalgias.  Skin: Negative for color change, pallor, rash, hair loss, nodules/bumps, skin tightness, ulcers and sensitivity to sunlight.    Allergic/Immunologic: Negative for susceptible to infections.  Neurological: Negative for dizziness, numbness, headaches and memory loss.  Hematological: Negative for swollen glands.  Psychiatric/Behavioral: Negative for depressed mood, confusion and sleep disturbance. The patient is nervous/anxious.     PMFS History:  Patient Active Problem List   Diagnosis Date Noted  . Rheumatoid arthritis ([place]) 06/18/2017  . Insomnia 08/01/2016  . Diabetes type 2, controlled ([place]) 04/19/2014  . Tobacco abuse 04/18/2014  . Arthralgia 06/29/2013  . Hyperparathyroidism ([place]) 06/11/2013  . Hypothyroidism (acquired) 06/11/2013    Past Medical History:  Diagnosis Date  . Allergy   . Anxiety    about surgery  . Arthritis   . Cancer ([place]) 1972   "carcinoma in situ in pap smear"  . Complication of anesthesia    Sept. 26 2013: "blood pressure dropped"   . Depression    moderate  . GERD (gastroesophageal reflux disease) 1996   hx of  . Pneumonia 2009   hx of  . Rheumatoid arthritis ([place])   . Shortness of breath    with walking  . Thyroid disease     Family History  Problem Relation Age of Onset  . Heart disease Father   . Diabetes Father   . Hyperlipidemia Father   . Kidney disease Father   . Alzheimer's disease Mother   . Cancer Brother        prostate  . Alzheimer's disease Brother   . Cancer Maternal Aunt        breast  . Cancer Paternal Aunt        uterine  . Cancer Paternal Grandmother        colon  .  Healthy Son    Past Surgical History:  Procedure Laterality Date  . ABDOMINAL HYSTERECTOMY  1977  . cataract surgery Bilateral 10/2015  . cyst removed Left 1962   knee  . [place] OF UTERUS  1972  . PARATHYROIDECTOMY Right 07/13/2013   Procedure: RIGHT INFERIOR PARATHYROIDECTOMY;  Surgeon: Ralene Ok, MD;  Location: [facility];  Service: General;  Laterality: Right;  . right shoulder rotater cuff Right 2013   x2   Social History   Social History  Narrative   Lives alone         Eating mainly raw foods   walks 5 miles  day   Immunization History  Administered Date(s) Administered  . Pneumococcal-Unspecified 11/06/2011  . Td 11/05/2004     Objective: Vital Signs: BP (!) 176/99 (BP Location: Left Arm, Patient Position: Sitting, Cuff Size: Large)   Pulse 70   Resp 16   Ht '5\' 7"'  (1.702 m)   Wt 239 lb (108.4 kg) Comment: per patient, patient refused to weigh  BMI 37.43 kg/m    Physical Exam Vitals and nursing note reviewed.  Constitutional:      Appearance: She is well-developed.  HENT:     Head: Normocephalic and atraumatic.  Eyes:     Conjunctiva/sclera: Conjunctivae normal.  Cardiovascular:     Rate and Rhythm: Normal rate and regular rhythm.     Heart sounds: Normal heart sounds.  Pulmonary:     Effort: Pulmonary effort is normal.     Breath sounds: Normal breath sounds.  Abdominal:     General: Bowel sounds are normal.     Palpations: Abdomen is soft.  Musculoskeletal:     Cervical back: Normal range of motion.  Lymphadenopathy:     Cervical: No cervical adenopathy.  Skin:    General: Skin is warm and dry.     Capillary Refill: Capillary refill takes less than 2 seconds.  Neurological:     Mental Status: She is alert and oriented to person, place, and time.  Psychiatric:        Behavior: Behavior normal.      Musculoskeletal Exam: C-spine good ROM.  Shoulder joints, elbow joints, wrist joints, MCPs, PIPs, and DIPs good ROM with no synovitis. Tenderness and inflammation of the left wrist joint. Tenderness of left 2nd and 3rd MCP joints.  PIP and DIP synovial thickening. Hip joints, knee joints, ankle joints, MTPs, PIPs, and DIPs good ROM with no synovitis.  No warmth or effusion of knee joints.  No tenderness or swelling of ankle joints.    CDAI Exam: CDAI Score: -- Patient Global: --; Provider Global: -- Swollen: 1 ; Tender: 3  Joint Exam 11/17/2019      Right  Left  Wrist     Swollen Tender  MCP  2      Tender  MCP 3      Tender      Investigation: No additional findings.  Imaging: XR Foot 2 Views Left  Result Date: 11/17/2019 No intertarsal or MTP joint space narrowing was noted.  PIP and DIP narrowing was noted.  Inferior calcaneal spur was noted.  No erosive changes were noted.  No juxta-articular osteopenia was noted. Impression: These findings are consistent with osteoarthritis of the foot.  XR Foot 2 Views Right  Result Date: 11/17/2019 No intertarsal or MTP joint space narrowing was noted.  PIP and DIP narrowing was noted.  Inferior calcaneal spur was noted.  No erosive changes were noted.  No  juxta-articular osteopenia was noted. Impression: These findings are consistent with osteoarthritis of the foot.  XR Hand 2 View Left  Result Date: 11/17/2019 No intercarpal or metacarpal carpal joint space narrowing was noted.  No MCP joint narrowing was noted.  PIP and DIP narrowing was noted.  Possible enchondroma was noted in the left second digit middle phalanx. Impression: These findings are consistent with osteoarthritis and possible enchondroma in the left second middle phalanx.  XR Hand 2 View Right  Result Date: 11/17/2019 No intercarpal, metacarpocarpal, MCP joint narrowing was noted.  No erosive changes were noted.  PIP and DIP narrowing was noted.  Enchondroma was noted in the right fourth middle phalanx. Impression: These findings are consistent with osteoarthritis of the hand and enchondroma in the right fourth digit.  XR Shoulder Left  Result Date: 11/17/2019 Glenohumeral or acromioclavicular joint space narrowing was noted.  Inferior spurring at the humeral head was noted.  No chondrocalcinosis was noted.  No erosive changes were noted. Impression: These findings are consistent with early osteoarthritic changes.  XR Shoulder Right  Result Date: 11/17/2019 Inferior spurring at the humeral head was noted.  Cystic changes in the humerus were noted.  Some glenohumeral  joint narrowing was noted.  Some AC joint arthritis was noted.  No erosive changes were noted. Impression: These findings are consistent with osteoarthritis of the shoulder.   Recent Labs: Lab Results  Component Value Date   WBC 6.3 05/23/2018   HGB 14.2 05/23/2018   PLT 192 05/23/2018   NA 136 05/23/2018   K 4.3 05/23/2018   CL 102 05/23/2018   CO2 20 05/23/2018   GLUCOSE 113 (H) 05/23/2018   BUN 8 05/23/2018   CREATININE 0.65 05/23/2018   BILITOT 0.7 05/23/2018   ALKPHOS 84 05/23/2018   AST 12 05/23/2018   ALT 13 05/23/2018   PROT 6.7 05/23/2018   ALBUMIN 4.5 05/23/2018   CALCIUM 10.6 (H) 05/23/2018   GFRAA 103 05/23/2018   November 02, 2019 CK 35, ESR 6, ANA negative, anti-CCP greater than 250, '14 3 3 ' eta 7.7 Speciality Comments: No specialty comments available.  Procedures:  No procedures performed Allergies: Lipitor [atorvastatin]   Assessment / Plan:     Visit Diagnoses: Rheumatoid arthritis involving multiple sites with positive rheumatoid factor (HCC) - Positive RF in the past, positive anti-CCP and '14 3 3 ' eta: She has tenderness and inflammation in the left wrist joint on exam.  Tenderness of the left second and third MCP joints noted.  She has been experiencing significant discomfort in bilateral shoulder joints, bilateral elbow joints, bilateral wrist joints, and bilateral hands.  We reviewed lab work obtained on 11/02/2019.  All questions were addressed.  We discussed the diagnosis of rheumatoid arthritis, and she was given a handout of information about rheumatoid arthritis.  We discussed starting her on Plaquenil 200 mg 1 tablet by mouth twice daily.  CBC and CMP WNL on 09/08/19.  She will require repeat lab work in 1 month, 3 months, then every 5 months.  Indications, contraindications, and potential side effects of Plaquenil were discussed.  Consent was obtained today.  We will send in the prescription today. She was advised to notify us if she cannot tolerate taking  PLQ.  She will follow up in 5 months.   Patient was counseled on the purpose, proper use, and adverse effects of hydroxychloroquine including nausea/diarrhea, skin rash, headaches, and sun sensitivity.  Discussed importance of annual eye exams while on hydroxychloroquine to monitor to ocular  toxicity and discussed importance of frequent laboratory monitoring.  Provided patient with eye exam form for baseline ophthalmologic exam.  Provided patient with educational materials on hydroxychloroquine and answered all questions.  Patient consented to hydroxychloroquine.  Will upload consent in the media tab.    Dose will be Plaquenil 200 mg twice daily.    High risk medication use: Plaquenil 200 mg 1 tablet by mouth twice daily. CBC and CMP WNL on 09/08/19.  She will update labs in 1 month, 3 months, then every 3.   Chronic pain of both shoulders - She has been experiencing pain in bilateral shoulder joints.  She has a history of rotator cuff repair x2 in the right shoulder joint. She has good range of motion of both shoulders with discomfort bilaterally.  X-rays of both shoulders were obtained today. Cystic changes in the right humerus noted. She has some glenohumeral and AC joint arthritis. She was advised to follow up with Dr. Durward Fortes for further evaluation.   Pain in both hands -She has been experiencing significant discomfort in both hands and both wrist joints.  She has tenderness and inflammation on the left wrist.  She has tenderness of the left second and third MCP joints.  She has PIP and DIP synovial thickening consistent with osteoarthritis of both hands.  X-rays of both hands were obtained today.  Primary osteoarthritis of both hands: She has PIP and DIP synovial thickening consistent with osteoarthritis of both hands.  She has tenderness of the left second and third MCP joints.  She has complete fist formation bilaterally.  X-rays of both hands were obtained today.  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 She was advised to follow up with the hand surgeon she has seen in the past.  According to the patient she has had this enchondroma for 20 years, and she was told she was not a good surgical candidate. Joint protection and muscle strengthening were discussed.  Primary osteoarthritis of both feet: She has been experiencing intermittent discomfort in both feet and both ankle joints.  X-rays of both feet were obtained today.  Other medical conditions are listed as follows:   Tobacco abuse -detailed counseling guarding smoking cessation was provided.  Association of smoking with rheumatoid arthritis was also emphasized.  Patient had bilateral hearing loss and has difficulty understanding unless spoken in the loud  Voice.  Other fatigue  Vitamin D deficiency  Hyperparathyroidism ([place])  Hypothyroidism (acquired)  Controlled type 2 diabetes mellitus without complication, without long-term current use of insulin ([place])  Other insomnia  Orders: Orders Placed This Encounter  Procedures  . XR Hand 2 View Right  . XR Hand 2 View Left  . XR Shoulder Left  . XR Shoulder Right  . XR Foot 2 Views Right  . XR Foot 2 Views Left  . CBC with Differential/Platelet  . COMPLETE METABOLIC PANEL WITH GFR   No orders of the defined types were placed in this encounter.   Face-to-face time spent with patient was 30 minutes. Greater than 50% of time was spent in counseling and coordination of care.  Follow-Up Instructions: Return in about 4 weeks (around 12/15/2019) for Rheumatoid arthritis, Osteoarthritis.   Ofilia Neas, PA-C   I examined and evaluated the patient with Hazel Sams PA.  Patient had swelling over her left wrist joint on examination today although she did have tenderness on several of her joints as described above.  She also has positive RF, positive anti-CCP and  '14 3 3 ' eta.  Detailed counseling of smoking cessation was provided.  Different treatment options and  their side effects were discussed.  After discussing indications side effects contraindications she was willing to proceed with Plaquenil.  We will start her on Plaquenil and we will see response.  We also discussed to enchondromas in her digits as mentioned above.  She states she has seen hand surgeon in the past and is not interested in having surgery.  She is having severe discomfort and pain in her left shoulder.  She will see Dr. Durward Fortes.  The plan of care was discussed as noted above.  Bo Merino, MD  Note - This record has been created using Editor, commissioning.  Chart creation errors have been sought, but may not always  have been located. Such creation errors do not reflect on  the standard of medical care.

## 2019-11-17 ENCOUNTER — Ambulatory Visit (INDEPENDENT_AMBULATORY_CARE_PROVIDER_SITE_OTHER): Payer: PPO

## 2019-11-17 ENCOUNTER — Encounter: Payer: Self-pay | Admitting: Rheumatology

## 2019-11-17 ENCOUNTER — Ambulatory Visit: Payer: Self-pay

## 2019-11-17 ENCOUNTER — Other Ambulatory Visit: Payer: Self-pay

## 2019-11-17 ENCOUNTER — Ambulatory Visit: Payer: PPO | Admitting: Rheumatology

## 2019-11-17 VITALS — BP 176/99 | HR 70 | Resp 16 | Ht 67.0 in | Wt 239.0 lb

## 2019-11-17 DIAGNOSIS — Z79899 Other long term (current) drug therapy: Secondary | ICD-10-CM | POA: Diagnosis not present

## 2019-11-17 DIAGNOSIS — M79642 Pain in left hand: Secondary | ICD-10-CM | POA: Diagnosis not present

## 2019-11-17 DIAGNOSIS — G8929 Other chronic pain: Secondary | ICD-10-CM | POA: Diagnosis not present

## 2019-11-17 DIAGNOSIS — M19071 Primary osteoarthritis, right ankle and foot: Secondary | ICD-10-CM

## 2019-11-17 DIAGNOSIS — R5383 Other fatigue: Secondary | ICD-10-CM

## 2019-11-17 DIAGNOSIS — M79641 Pain in right hand: Secondary | ICD-10-CM

## 2019-11-17 DIAGNOSIS — M79672 Pain in left foot: Secondary | ICD-10-CM | POA: Diagnosis not present

## 2019-11-17 DIAGNOSIS — M25512 Pain in left shoulder: Secondary | ICD-10-CM | POA: Diagnosis not present

## 2019-11-17 DIAGNOSIS — H918X3 Other specified hearing loss, bilateral: Secondary | ICD-10-CM

## 2019-11-17 DIAGNOSIS — M25511 Pain in right shoulder: Secondary | ICD-10-CM | POA: Diagnosis not present

## 2019-11-17 DIAGNOSIS — Z720 Tobacco use: Secondary | ICD-10-CM

## 2019-11-17 DIAGNOSIS — E039 Hypothyroidism, unspecified: Secondary | ICD-10-CM | POA: Diagnosis not present

## 2019-11-17 DIAGNOSIS — E213 Hyperparathyroidism, unspecified: Secondary | ICD-10-CM

## 2019-11-17 DIAGNOSIS — E119 Type 2 diabetes mellitus without complications: Secondary | ICD-10-CM

## 2019-11-17 DIAGNOSIS — E559 Vitamin D deficiency, unspecified: Secondary | ICD-10-CM | POA: Diagnosis not present

## 2019-11-17 DIAGNOSIS — M19041 Primary osteoarthritis, right hand: Secondary | ICD-10-CM | POA: Diagnosis not present

## 2019-11-17 DIAGNOSIS — M79671 Pain in right foot: Secondary | ICD-10-CM

## 2019-11-17 DIAGNOSIS — M0579 Rheumatoid arthritis with rheumatoid factor of multiple sites without organ or systems involvement: Secondary | ICD-10-CM | POA: Diagnosis not present

## 2019-11-17 DIAGNOSIS — M19072 Primary osteoarthritis, left ankle and foot: Secondary | ICD-10-CM

## 2019-11-17 DIAGNOSIS — G4709 Other insomnia: Secondary | ICD-10-CM

## 2019-11-17 DIAGNOSIS — M19042 Primary osteoarthritis, left hand: Secondary | ICD-10-CM

## 2019-11-17 MED ORDER — HYDROXYCHLOROQUINE SULFATE 200 MG PO TABS: 200.0000 mg | ORAL_TABLET | Freq: Two times a day (BID) | ORAL | 0 refills | Status: DC

## 2019-11-17 NOTE — Patient Instructions (Addendum)
Standing Labs We placed an order today for your standing lab work.    Please come back and get your standing labs in 1 month, then 3 months, then every 5 months   We have open lab daily Monday through Thursday from 8:30-12:30 PM and 1:30-4:30 PM and Friday from 8:30-12:30 PM and 1:30-4:00 PM at the office of Dr. Bo Merino.   You may experience shorter wait times on Monday and Friday afternoons. The office is located at [address], [place], [place], [place] [address] No appointment is necessary.   Labs are drawn by [facility].  You may receive a bill from [facility] for your lab work.  If you wish to have your labs drawn at another location, please call the office 24 hours in advance to send orders.  If you have any questions regarding directions or hours of operation,  please call [phone].   Just as a reminder please drink plenty of water prior to coming for your lab work. Thanks!    Hydroxychloroquine tablets What is this medicine? HYDROXYCHLOROQUINE (hye drox ee KLOR oh kwin) is used to treat rheumatoid arthritis and systemic lupus erythematosus. It is also used to treat malaria. This medicine may be used for other purposes; ask your health care provider or pharmacist if you have questions. COMMON BRAND NAME(S): Plaquenil, Quineprox What should I tell my health care provider before I take this medicine? They need to know if you have any of these conditions:  diabetes  eye disease, vision problems  G6PD deficiency  heart disease  history of irregular heartbeat  if you often drink alcohol  kidney disease  liver disease  porphyria  psoriasis  an unusual or allergic reaction to chloroquine, hydroxychloroquine, other medicines, foods, dyes, or preservatives  pregnant or trying to get pregnant  breast-feeding How should I use this medicine? Take this medicine by mouth with a glass of water. Follow the directions on the prescription label. Do not cut,  crush or chew this medicine. Swallow the tablets whole. Take this medicine with food. Avoid taking antacids within 4 hours of taking this medicine. It is best to separate these medicines by at least 4 hours. Take your medicine at regular intervals. Do not take it more often than directed. Take all of your medicine as directed even if you think you are better. Do not skip doses or stop your medicine early. Talk to your pediatrician regarding the use of this medicine in children. While this drug may be prescribed for selected conditions, precautions do apply. Overdosage: If you think you have taken too much of this medicine contact a poison control center or emergency room at once. NOTE: This medicine is only for you. Do not share this medicine with others. What if I miss a dose? If you miss a dose, take it as soon as you can. If it is almost time for your next dose, take only that dose. Do not take double or extra doses. What may interact with this medicine? Do not take this medicine with any of the following medications:  cisapride  dronedarone  pimozide  thioridazine This medicine may also interact with the following medications:  ampicillin  antacids  cimetidine  cyclosporine  digoxin  kaolin  medicines for diabetes, like insulin, glipizide, glyburide  medicines for seizures like carbamazepine, phenobarbital, phenytoin  mefloquine  methotrexate  other medicines that prolong the QT interval (cause an abnormal heart rhythm)  praziquantel This list may not describe all possible interactions. Give your health  care provider a list of all the medicines, herbs, non-prescription drugs, or dietary supplements you use. Also tell them if you smoke, drink alcohol, or use illegal drugs. Some items may interact with your medicine. What should I watch for while using this medicine? Visit your health care professional for regular checks on your progress. Tell your health care  professional if your symptoms do not start to get better or if they get worse. You may need blood work done while you are taking this medicine. If you take other medicines that can affect heart rhythm, you may need more testing. Talk to your health care professional if you have questions. Your vision may be tested before and during use of this medicine. Tell your health care professional right away if you have any change in your eyesight. What side effects may I notice from receiving this medicine? Side effects that you should report to your doctor or health care professional as soon as possible:  allergic reactions like skin rash, itching or hives, swelling of the face, lips, or tongue  changes in vision  decreased hearing or ringing of the ears  muscle weakness  redness, blistering, peeling or loosening of the skin, including inside the mouth  sensitivity to light  signs and symptoms of a dangerous change in heartbeat or heart rhythm like chest pain; dizziness; fast or irregular heartbeat; palpitations; feeling faint or lightheaded, falls; breathing problems  signs and symptoms of liver injury like dark yellow or brown urine; general ill feeling or flu-like symptoms; light-colored stools; loss of appetite; nausea; right upper belly pain; unusually weak or tired; yellowing of the eyes or skin  signs and symptoms of low blood sugar such as feeling anxious; confusion; dizziness; increased hunger; unusually weak or tired; sweating; shakiness; cold; irritable; headache; blurred vision; fast heartbeat; loss of consciousness  suicidal thoughts  uncontrollable head, mouth, neck, arm, or leg movements Side effects that usually do not require medical attention (report to your doctor or health care professional if they continue or are bothersome):  diarrhea  dizziness  hair loss  headache  irritable  loss of appetite  nausea, vomiting  stomach pain This list may not describe all  possible side effects. Call your doctor for medical advice about side effects. You may report side effects to FDA at 1-800-FDA-1088. Where should I keep my medicine? Keep out of the reach of children. Store at room temperature between 15 and 30 degrees C (59 and 86 degrees F). Protect from moisture and light. Throw away any unused medicine after the expiration date. NOTE: This sheet is a summary. It may not cover all possible information. If you have questions about this medicine, talk to your doctor, pharmacist, or health care provider.  2020 Elsevier/Gold Standard (2019-03-02 12:56:32)

## 2019-11-18 ENCOUNTER — Encounter: Payer: Self-pay | Admitting: Rheumatology

## 2019-11-24 ENCOUNTER — Other Ambulatory Visit: Payer: Self-pay

## 2019-11-24 ENCOUNTER — Encounter: Payer: Self-pay | Admitting: Orthopaedic Surgery

## 2019-11-24 ENCOUNTER — Ambulatory Visit: Payer: PPO | Admitting: Orthopaedic Surgery

## 2019-11-24 VITALS — Ht 67.0 in | Wt 239.0 lb

## 2019-11-24 DIAGNOSIS — M19012 Primary osteoarthritis, left shoulder: Secondary | ICD-10-CM

## 2019-11-24 DIAGNOSIS — M25511 Pain in right shoulder: Secondary | ICD-10-CM

## 2019-11-24 DIAGNOSIS — M19011 Primary osteoarthritis, right shoulder: Secondary | ICD-10-CM | POA: Diagnosis not present

## 2019-11-24 DIAGNOSIS — M19019 Primary osteoarthritis, unspecified shoulder: Secondary | ICD-10-CM | POA: Insufficient documentation

## 2019-11-24 DIAGNOSIS — G8929 Other chronic pain: Secondary | ICD-10-CM | POA: Diagnosis not present

## 2019-11-24 NOTE — Progress Notes (Signed)
Office Visit Note   Patient: Linda Atkinson           Date of Birth: 12/05/1945           MRN: [id number] Visit Date: 11/24/2019              Requested by: No referring provider defined for this encounter. PCP: Default, Provider, MD   Assessment & Plan: Visit Diagnoses:  1. Chronic right shoulder pain   2. Primary osteoarthritis of both shoulders     Plan: Osteoarthritis both shoulders.  The right is more involved by x-ray and more symptomatic.  Linda Atkinson has had prior rotator cuff surgery over 7 years ago when I am not sure if this represents rotator cuff arthropathy or just primary arthritis.  She also has a history of rheumatoid arthritis followed by Dr. Estanislado Pandy.. She is taking Plaquenil.  I will order a thin slice CT scan of her right shoulder and make an appointment for her to see Dr. Marlou Sa after that study.  She has reached a point where she has significant compromise of her activities.  Not sure she would be a candidate for primary total shoulder replacement versus reverse.  The above study may help  Follow-Up Instructions: Return After thin slice CT scan right shoulder.   Orders:  Orders Placed This Encounter  Procedures  . CT SHOULDER RIGHT WO CONTRAST  . Ambulatory referral to Orthopedic Surgery   No orders of the defined types were placed in this encounter.     Procedures: No procedures performed   Clinical Data: No additional findings.   Subjective: Chief Complaint  Patient presents with  . Right Shoulder - Pain  Patient presents today for right shoulder pain. No known injury. She said that it has been hurting for about two months. She has a history of rotator cuff surgery in 2013 with Dr.Alexsandro Salek. She saw Dr.Deveswhar and had x-rays this month. Her shoulder hurts all over and down into her arm. She has pain with range of motion. She is taking Aleve for pain. She is right hand dominant.  Lives by herself and is having significant compromise of her activities  and having sleep related to her right shoulder.  HPI  Review of Systems   Objective: Vital Signs: Ht 5\' 7"  (1.702 m)   Wt 239 lb (108.4 kg)   BMI 37.43 kg/m   Physical Exam Constitutional:      Appearance: She is well-developed.  Eyes:     Pupils: Pupils are equal, round, and reactive to light.  Pulmonary:     Effort: Pulmonary effort is normal.  Skin:    General: Skin is warm and dry.  Neurological:     Mental Status: She is alert and oriented to person, place, and time.  Psychiatric:        Behavior: Behavior normal.     Ortho Exam awake alert and oriented x3.  Comfortable sitting difficult loss of motion right shoulder but tickly with external rotation which she had about 20 degrees.  She had about 70 degrees of internal rotation.  She could slowly lift her arm all all the way over her head.  I did not think she any significant weakness with internal and external rotation today.  No crepitation.  Does have some pain along the anterior aspect of her shoulder in the area of the glenohumeral joint.  Good grip and good release  Specialty Comments:  No specialty comments available.  Imaging: No results found.  PMFS History: Patient Active Problem List   Diagnosis Date Noted  . 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 Rheumatoid arthritis ([place]) 06/18/2017  . Insomnia 08/01/2016  . Diabetes type 2, controlled ([place]) 04/19/2014  . Tobacco abuse 04/18/2014  . Arthralgia 06/29/2013  . Hyperparathyroidism ([place]) 06/11/2013  . Hypothyroidism (acquired) 06/11/2013   Past Medical History:  Diagnosis Date  . Allergy   . Anxiety    about surgery  . Arthritis   . 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 Complication of anesthesia    Sept. 26 2013: "blood pressure dropped"   . Depression    moderate  . GERD (gastroesophageal reflux disease) 1996   hx of  . Pneumonia 2009   hx of  . Rheumatoid arthritis ([place])   . Shortness of breath    with walking    . Thyroid disease     Family History  Problem Relation Age of Onset  . Heart disease Father   . Diabetes Father   . Hyperlipidemia Father   . Kidney disease Father   . Alzheimer's disease Mother   . Cancer Brother        prostate  . Alzheimer's disease Brother   . Cancer Maternal Aunt        breast  . Cancer Paternal Aunt        uterine  . Cancer Paternal Grandmother        colon  . Healthy Son     Past Surgical History:  Procedure Laterality Date  . ABDOMINAL HYSTERECTOMY  1977  . cataract surgery Bilateral 10/2015  . cyst removed Left 1962   knee  . [place] OF UTERUS  1972  . PARATHYROIDECTOMY Right 07/13/2013   Procedure: RIGHT INFERIOR PARATHYROIDECTOMY;  Surgeon: Ralene Ok, MD;  Location: [facility];  Service: General;  Laterality: Right;  . right shoulder rotater cuff Right 2013   x2   Social History   Occupational History  . Not on file  Tobacco Use  . Smoking status: Current Every Day Smoker    Packs/day: 0.50    Years: 10.00    Pack years: 5.00    Types: Cigarettes  . Smokeless tobacco: Never Used  Substance and Sexual Activity  . Alcohol use: No    Alcohol/week: 0.0 standard drinks  . Drug use: No  . Sexual activity: Not on file

## 2019-11-30 ENCOUNTER — Ambulatory Visit
Admission: RE | Admit: 2019-11-30 | Discharge: 2019-11-30 | Disposition: A | Discharge status: Home / Self Care | Payer: PPO | Source: Ambulatory Visit | Attending: Orthopaedic Surgery | Admitting: Orthopaedic Surgery

## 2019-11-30 DIAGNOSIS — G8929 Other chronic pain: Secondary | ICD-10-CM

## 2019-11-30 DIAGNOSIS — M19011 Primary osteoarthritis, right shoulder: Secondary | ICD-10-CM | POA: Diagnosis not present

## 2019-12-03 ENCOUNTER — Other Ambulatory Visit: Payer: Self-pay

## 2019-12-03 ENCOUNTER — Ambulatory Visit: Payer: Self-pay

## 2019-12-03 ENCOUNTER — Ambulatory Visit: Payer: PPO | Admitting: Orthopedic Surgery

## 2019-12-03 ENCOUNTER — Encounter: Payer: Self-pay | Admitting: Orthopedic Surgery

## 2019-12-03 DIAGNOSIS — M069 Rheumatoid arthritis, unspecified: Secondary | ICD-10-CM | POA: Diagnosis not present

## 2019-12-03 DIAGNOSIS — M25511 Pain in right shoulder: Secondary | ICD-10-CM | POA: Diagnosis not present

## 2019-12-03 NOTE — Progress Notes (Signed)
Office Visit Note   Patient: Linda Atkinson           Date of Birth: [birth year]           MRN: [id number] Visit Date: 12/03/2019 Requested by: Garald Balding, MD [address] [place],  [place] [address] PCP: Default, Provider, MD  Subjective: Chief Complaint  Patient presents with  . Right Shoulder - Follow-up, Pain    HPI: Linda Atkinson is a 74 y.o. female who presents to the office complaining of right shoulder pain.  Patient has a long history of chronic right shoulder pain.  She notes this has been going on for several years.  She has a history of 2 rotator cuff repairs both by Dr. Durward Fortes in June and September 2013.  She also has history of rheumatoid arthritis since 2014.  She has not been on any medication specifically for rheumatoid arthritis until this year.  She has been on hydroxychloroquine for about 3 weeks now.  She notes pain in both shoulders but mostly in her right shoulder.  She has significant pain with lifting out away from her body.  She does have good functional range of motion of the shoulder and is able to go overhead.  The pain is severe enough that it keeps her from sleeping at night and she has been taking Aleve with some relief.  She denies any cardiac history, diabetes, family/personal history of blood clots.  She does smoke half pack per day..                ROS:  All systems reviewed are negative as they relate to the chief complaint within the history of present illness.  Patient denies fevers or chills.  Assessment & Plan: Visit Diagnoses:  1. Rheumatoid arthritis involving right shoulder, unspecified whether rheumatoid factor present [facility])     Plan: Patient is a 74 year old female who presents complaining of right shoulder pain.  She has been referred to this office today for discussion for shoulder replacement.  Her CT scan was reviewed with the patient and surgery was discussed including all the risks and benefits of the procedure.  She does  have significant, severe arthritis of the right shoulder with multiple cystic changes in the humeral head.Marland Kitchen  However her function is relatively preserved and she has good strength of the rotator cuff on exam.  However we would plan for a reverse shoulder arthroplasty due to her history involving the rotator cuff, if the time comes when she wants surgery.  She is only been on Plaquenil for about 3 weeks now and has not tried any cortisone injections for about 5 years.  Patient states that she has not at the point that she wants surgery now.  Plan for cortisone injection of the right shoulder today under ultrasound guidance.  Plan for 40-month return and we will see how the Plaquenil and cortisone injection helped her symptoms.  Patient agrees with plan.  Follow-Up Instructions: No follow-ups on file.   Orders:  No orders of the defined types were placed in this encounter.  No orders of the defined types were placed in this encounter.     Procedures: No procedures performed   Clinical Data: No additional findings.  Objective: Vital Signs: There were no vitals taken for this visit.  Physical Exam:  Constitutional: Patient appears well-developed HEENT:  Head: Normocephalic Eyes:EOM are normal Neck: Normal range of motion Cardiovascular: Normal rate Pulmonary/chest: Effort normal Neurologic: Patient is alert Skin: Skin  is warm Psychiatric: Patient has normal mood and affect  Ortho Exam:  Right shoulder Exam Able to forward flex and abduct shoulder overhead slowly compared with the contralateral side No loss of ER relative to the other shoulder.  Good endpoint with ER 5/5 motor strength of the subscapularis, supraspinatus, and infraspinatus muscles 5/5 grip strength, forearm pronation/supination, and bicep strength   Specialty Comments:  No specialty comments available.  Imaging: No results found.   PMFS History: Patient Active Problem List   Diagnosis Date Noted  .  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 Rheumatoid arthritis ([place]) 06/18/2017  . Insomnia 08/01/2016  . Diabetes type 2, controlled ([place]) 04/19/2014  . Tobacco abuse 04/18/2014  . Arthralgia 06/29/2013  . Hyperparathyroidism ([place]) 06/11/2013  . Hypothyroidism (acquired) 06/11/2013   Past Medical History:  Diagnosis Date  . Allergy   . Anxiety    about surgery  . Arthritis   . 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 Complication of anesthesia    Sept. 26 2013: "blood pressure dropped"   . Depression    moderate  . GERD (gastroesophageal reflux disease) 1996   hx of  . Pneumonia 2009   hx of  . Rheumatoid arthritis ([place])   . Shortness of breath    with walking  . Thyroid disease     Family History  Problem Relation Age of Onset  . Heart disease Father   . Diabetes Father   . Hyperlipidemia Father   . Kidney disease Father   . Alzheimer's disease Mother   . Cancer Brother        prostate  . Alzheimer's disease Brother   . Cancer Maternal Aunt        breast  . Cancer Paternal Aunt        uterine  . Cancer Paternal Grandmother        colon  . Healthy Son     Past Surgical History:  Procedure Laterality Date  . ABDOMINAL HYSTERECTOMY  1977  . cataract surgery Bilateral 10/2015  . cyst removed Left 1962   knee  . [place] OF UTERUS  1972  . PARATHYROIDECTOMY Right 07/13/2013   Procedure: RIGHT INFERIOR PARATHYROIDECTOMY;  Surgeon: Ralene Ok, MD;  Location: [facility];  Service: General;  Laterality: Right;  . right shoulder rotater cuff Right 2013   x2   Social History   Occupational History  . Not on file  Tobacco Use  . Smoking status: Current Every Day Smoker    Packs/day: 0.50    Years: 10.00    Pack years: 5.00    Types: Cigarettes  . Smokeless tobacco: Never Used  Substance and Sexual Activity  . Alcohol use: No    Alcohol/week: 0.0 standard drinks  . Drug use: No  . Sexual activity: Not on file

## 2019-12-03 NOTE — Progress Notes (Signed)
Subjective: Patient is here for ultrasound-guided intra-articular right glenohumeral injection.  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  Objective: Pain with overhead and behind the back reach cramp  Procedure: Ultrasound-guided right glenohumeral injection: After sterile prep with Betadine, injected 8 cc 1% lidocaine without epinephrine and 40 mg methylprednisolone using a 22-gauge spinal needle, passing the needle through approach into the glenohumeral joint.  Injectate was seen filling the joint capsule.  She had very good immediate relief.

## 2019-12-05 ENCOUNTER — Encounter: Payer: Self-pay | Admitting: Orthopedic Surgery

## 2019-12-13 ENCOUNTER — Encounter: Payer: Self-pay | Admitting: Rheumatology

## 2019-12-17 ENCOUNTER — Encounter: Payer: Self-pay | Admitting: Rheumatology

## 2019-12-17 DIAGNOSIS — M0609 Rheumatoid arthritis without rheumatoid factor, multiple sites: Secondary | ICD-10-CM | POA: Diagnosis not present

## 2019-12-17 DIAGNOSIS — Z961 Presence of intraocular lens: Secondary | ICD-10-CM | POA: Diagnosis not present

## 2019-12-17 DIAGNOSIS — Z79899 Other long term (current) drug therapy: Secondary | ICD-10-CM | POA: Diagnosis not present

## 2019-12-17 LAB — HM DIABETES EYE EXAM

## 2019-12-18 NOTE — Progress Notes (Signed)
Office Visit Note  Patient: Linda Atkinson             Date of Birth: Feb 11, 1946           MRN: [id number]             PCP: Default, Provider, MD Referring: No ref. provider found Visit Date: 12/22/2019 Occupation: @GUAROCC @  Subjective:  Medication management  History of Present Illness: Linda Atkinson is a 74 y.o. female with history of rheumatoid arthritis and osteoarthritis.  She was a started on Plaquenil about a month ago.  She has noticed some improvement with the Plaquenil.  She states her right shoulder joint was hurting a lot.  She was seen by Dr. Clent Jacks and had a cortisone injection in her right shoulder which gave her a lot of relief.  She continues to have pain and discomfort in her bilateral hands.  She notices some swelling in her right thumb.  Activities of Daily Living:  Patient reports morning stiffness for 3 hours.   Patient Reports nocturnal pain.  Difficulty dressing/grooming: Reports Difficulty climbing stairs: Denies Difficulty getting out of chair: Denies Difficulty using hands for taps, buttons, cutlery, and/or writing: Reports  Review of Systems  Constitutional: Positive for fatigue. Negative for night sweats, weight gain and weight loss.  HENT: Negative for mouth sores, trouble swallowing, trouble swallowing, mouth dryness and nose dryness.   Eyes: Negative for pain, redness, visual disturbance and dryness.  Respiratory: Negative for cough, shortness of breath and difficulty breathing.   Cardiovascular: Negative for chest pain, palpitations, hypertension, irregular heartbeat and swelling in legs/feet.  Gastrointestinal: Positive for constipation and diarrhea. Negative for blood in stool.  Endocrine: Negative for cold intolerance, heat intolerance and increased urination.  Genitourinary: Negative for difficulty urinating and vaginal dryness.  Musculoskeletal: Positive for arthralgias, joint pain, joint swelling, morning stiffness and muscle tenderness.  Negative for myalgias, muscle weakness and myalgias.  Skin: Negative for color change, rash, hair loss, skin tightness, ulcers and sensitivity to sunlight.  Allergic/Immunologic: Negative for susceptible to infections.  Neurological: Negative for dizziness, numbness, memory loss, night sweats and weakness.  Hematological: Negative for bruising/bleeding tendency and swollen glands.  Psychiatric/Behavioral: Positive for sleep disturbance. Negative for depressed mood. The patient is not nervous/anxious.     PMFS History:  Patient Active Problem List   Diagnosis Date Noted  . 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 Rheumatoid arthritis ([place]) 06/18/2017  . Insomnia 08/01/2016  . Diabetes type 2, controlled ([place]) 04/19/2014  . Tobacco abuse 04/18/2014  . Arthralgia 06/29/2013  . Hyperparathyroidism ([place]) 06/11/2013  . Hypothyroidism (acquired) 06/11/2013    Past Medical History:  Diagnosis Date  . Allergy   . Anxiety    about surgery  . Arthritis   . 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 Complication of anesthesia    Sept. 26 2013: "blood pressure dropped"   . Depression    moderate  . GERD (gastroesophageal reflux disease) 1996   hx of  . Pneumonia 2009   hx of  . Rheumatoid arthritis ([place])   . Shortness of breath    with walking  . Thyroid disease     Family History  Problem Relation Age of Onset  . Heart disease Father   . Diabetes Father   . Hyperlipidemia Father   . Kidney disease Father   . Alzheimer's disease Mother   . Cancer Brother        prostate  .  Alzheimer's disease Brother   . Cancer Maternal Aunt        breast  . Cancer Paternal Aunt        uterine  . Cancer Paternal Grandmother        colon  . Healthy Son    Past Surgical History:  Procedure Laterality Date  . ABDOMINAL HYSTERECTOMY  1977  . cataract surgery Bilateral 10/2015  . cyst removed Left 1962   knee  . [place] OF UTERUS  1972  . PARATHYROIDECTOMY  Right 07/13/2013   Procedure: RIGHT INFERIOR PARATHYROIDECTOMY;  Surgeon: Ralene Ok, MD;  Location: [facility];  Service: General;  Laterality: Right;  . right shoulder rotater cuff Right 2013   x2   Social History   Social History Narrative   Lives alone         Eating mainly raw foods   walks 5 miles  day   Immunization History  Administered Date(s) Administered  . Pneumococcal-Unspecified 11/06/2011  . Td 11/05/2004     Objective: Vital Signs: BP 112/84 (BP Location: Left Arm, Patient Position: Sitting, Cuff Size: Normal)   Pulse 88   Resp 20   Ht 5\' 7"  (1.702 m)   BMI 37.43 kg/m    Physical Exam Vitals and nursing note reviewed.  Constitutional:      Appearance: She is well-developed.  HENT:     Head: Normocephalic and atraumatic.  Eyes:     Conjunctiva/sclera: Conjunctivae normal.  Cardiovascular:     Rate and Rhythm: Normal rate and regular rhythm.     Heart sounds: Normal heart sounds.  Pulmonary:     Effort: Pulmonary effort is normal.     Breath sounds: Normal breath sounds.  Abdominal:     General: Bowel sounds are normal.     Palpations: Abdomen is soft.  Musculoskeletal:     Cervical back: Normal range of motion.  Lymphadenopathy:     Cervical: No cervical adenopathy.  Skin:    General: Skin is warm and dry.     Capillary Refill: Capillary refill takes less than 2 seconds.  Neurological:     Mental Status: She is alert and oriented to person, place, and time.  Psychiatric:        Behavior: Behavior normal.      Musculoskeletal Exam: C-spine was in good range of motion.  Right shoulder abduction was about 140 degrees.  Left shoulder joint was in full range of motion.  Elbow joints and wrist joints with good range of motion.  She has some DIP and PIP thickening bilaterally but no synovitis was noted.  Hip joints and knee joints with good range of motion with no synovitis.  She had no synovitis of her ankle joints.  CDAI Exam: CDAI Score: 0.6    Patient Global: 3 mm; Provider Global: 3 mm Swollen: 0 ; Tender: 0  Joint Exam 12/22/2019   No joint exam has been documented for this visit   There is currently no information documented on the homunculus. Go to the Rheumatology activity and complete the homunculus joint exam.  Investigation: No additional findings.  Imaging: CT SHOULDER RIGHT WO CONTRAST  Result Date: 11/30/2019 CLINICAL DATA:  Chronic right shoulder pain. EXAM: CT OF THE UPPER RIGHT EXTREMITY WITHOUT CONTRAST TECHNIQUE: Multidetector CT imaging of the right shoulder was performed according to the standard protocol. COMPARISON:  Right shoulder x-rays dated November 17, 2019. MRI right shoulder dated July 10, 2012. FINDINGS: Bones/Joint/Cartilage No fracture or dislocation. Normal alignment.  Small glenohumeral joint effusion. Severe osteoarthritis of the glenohumeral joint with severe joint space narrowing, bone-on-bone appearance, subchondral sclerosis, subchondral cystic changes and marginal osteophytosis. Large geode in the humeral head. Small intra-articular bodies anterior to the subscapularis tendon and within the bicipital groove. Prior distal clavicle resection and acromioplasty. Ligaments Ligaments are suboptimally evaluated by CT. Muscles and Tendons The rotator cuff tendons are not well evaluated by CT. Suture anchor in the anterior greater tuberosity from prior rotator cuff repair. Moderate atrophy of the teres minor muscle. Mild fatty infiltration of the remaining rotator cuff muscles. 4.5 x 1.8 x 3.4 cm lipoma within the teres major muscle. Soft tissue No fluid collection or hematoma. There are a few 2-3 mm pulmonary nodules in the right upper lobe. IMPRESSION: 1. Severe glenohumeral osteoarthritis with small intra-articular bodies. 2. 4.5 cm lipoma within the teres major muscle. 3. Few 2-3 mm pulmonary nodules in the right upper lobe. No follow-up needed if patient is low-risk (and has no known or suspected primary  neoplasm). Non-contrast chest CT can be considered in 12 months if patient is high-risk. This recommendation follows the consensus statement: Guidelines for Management of Incidental Pulmonary Nodules Detected on CT Images: From the Fleischner Society 2017; Radiology 2017; 284:228-243. Electronically Signed   By: Titus Dubin M.D.   On: 11/30/2019 09:25   US Guided Needle Placement - No Linked Charges  Result Date: 12/03/2019 Please see Notes tab for imaging impression.   Recent Labs: Lab Results  Component Value Date   WBC 6.3 05/23/2018   HGB 14.2 05/23/2018   PLT 192 05/23/2018   NA 136 05/23/2018   K 4.3 05/23/2018   CL 102 05/23/2018   CO2 20 05/23/2018   GLUCOSE 113 (H) 05/23/2018   BUN 8 05/23/2018   CREATININE 0.65 05/23/2018   BILITOT 0.7 05/23/2018   ALKPHOS 84 05/23/2018   AST 12 05/23/2018   ALT 13 05/23/2018   PROT 6.7 05/23/2018   ALBUMIN 4.5 05/23/2018   CALCIUM 10.6 (H) 05/23/2018   GFRAA 103 05/23/2018    Speciality Comments: PLQ eye exam: 12/17/2019 normal. [facility]. Follow up in 6 months.  Procedures:  No procedures performed Allergies: Lipitor [atorvastatin]   Assessment / Plan:     Visit Diagnoses: Rheumatoid arthritis involving multiple sites with positive rheumatoid factor (HCC) - Positive RF in the past, positive anti-CCP and 14 3 3  eta: Patient was placed on Plaquenil about a month ago.  She states she has noticed some improvement with Plaquenil.  She had no synovitis on examination.  Her morning stiffness is improved.  High risk medication use - PLQ 200 mg BID, started in January 2021.  Patient had eye examination last week which was normal per patient.  We will check labs today and then in 3 months.- Plan: CBC with Differential/Platelet, COMPLETE METABOLIC PANEL WITH GFR  Primary osteoarthritis of both hands-she has severe osteoarthritis in her hands which causes discomfort.  Joint protection was discussed.  Primary osteoarthritis  of both feet-she is currently not having much discomfort.  Tobacco abuse-smoking cessation was emphasized.  Association of smoking with rheumatoid arthritis was also discussed.  Other fatigue-she continues to have some chronic fatigue.  Vitamin D deficiency -patient states that she was prescribed vitamin D for 3 months which she just finished.  We will check levels today.  Plan: VITAMIN D 25 Hydroxy (Vit-D Deficiency, Fractures)  Other medical problems are listed as follows:  Hyperparathyroidism ([place])  Hypothyroidism (acquired)  Controlled type 2 diabetes  mellitus without complication, without long-term current use of insulin ([place])  Other insomnia  Orders: Orders Placed This Encounter  Procedures  . CBC with Differential/Platelet  . COMPLETE METABOLIC PANEL WITH GFR  . VITAMIN D 25 Hydroxy (Vit-D Deficiency, Fractures)   No orders of the defined types were placed in this encounter.     Follow-Up Instructions: Return in about 5 months (around 05/20/2020) for Rheumatoid arthritis, Osteoarthritis.   Bo Merino, MD  Note - This record has been created using Editor, commissioning.  Chart creation errors have been sought, but may not always  have been located. Such creation errors do not reflect on  the standard of medical care.

## 2019-12-22 ENCOUNTER — Other Ambulatory Visit: Payer: Self-pay

## 2019-12-22 ENCOUNTER — Ambulatory Visit: Payer: PPO | Admitting: Rheumatology

## 2019-12-22 ENCOUNTER — Encounter: Payer: Self-pay | Admitting: Rheumatology

## 2019-12-22 VITALS — BP 112/84 | HR 88 | Resp 20 | Ht 67.0 in

## 2019-12-22 DIAGNOSIS — M0579 Rheumatoid arthritis with rheumatoid factor of multiple sites without organ or systems involvement: Secondary | ICD-10-CM | POA: Diagnosis not present

## 2019-12-22 DIAGNOSIS — E559 Vitamin D deficiency, unspecified: Secondary | ICD-10-CM | POA: Diagnosis not present

## 2019-12-22 DIAGNOSIS — M19071 Primary osteoarthritis, right ankle and foot: Secondary | ICD-10-CM | POA: Diagnosis not present

## 2019-12-22 DIAGNOSIS — M19041 Primary osteoarthritis, right hand: Secondary | ICD-10-CM

## 2019-12-22 DIAGNOSIS — E039 Hypothyroidism, unspecified: Secondary | ICD-10-CM

## 2019-12-22 DIAGNOSIS — E213 Hyperparathyroidism, unspecified: Secondary | ICD-10-CM

## 2019-12-22 DIAGNOSIS — E119 Type 2 diabetes mellitus without complications: Secondary | ICD-10-CM | POA: Diagnosis not present

## 2019-12-22 DIAGNOSIS — M19042 Primary osteoarthritis, left hand: Secondary | ICD-10-CM

## 2019-12-22 DIAGNOSIS — M19072 Primary osteoarthritis, left ankle and foot: Secondary | ICD-10-CM

## 2019-12-22 DIAGNOSIS — Z720 Tobacco use: Secondary | ICD-10-CM

## 2019-12-22 DIAGNOSIS — G4709 Other insomnia: Secondary | ICD-10-CM | POA: Diagnosis not present

## 2019-12-22 DIAGNOSIS — R5383 Other fatigue: Secondary | ICD-10-CM

## 2019-12-22 DIAGNOSIS — Z79899 Other long term (current) drug therapy: Secondary | ICD-10-CM

## 2019-12-23 LAB — COMPLETE METABOLIC PANEL WITH GFR
AG Ratio: 2.2 (calc) (ref 1.0–2.5)
ALT: 30 U/L — ABNORMAL HIGH (ref 6–29)
AST: 18 U/L (ref 10–35)
Albumin: 4.2 g/dL (ref 3.6–5.1)
Alkaline phosphatase (APISO): 99 U/L (ref 37–153)
BUN: 15 mg/dL (ref 7–25)
CO2: 26 mmol/L (ref 20–32)
Calcium: 10.8 mg/dL — ABNORMAL HIGH (ref 8.6–10.4)
Chloride: 106 mmol/L (ref 98–110)
Creat: 0.72 mg/dL (ref 0.60–0.93)
GFR, Est African American: 97 mL/min/{1.73_m2} (ref >=60)
GFR, Est Non African American: 84 mL/min/{1.73_m2} (ref >=60)
Globulin: 1.9 g/dL (calc) (ref 1.9–3.7)
Glucose, Bld: 201 mg/dL — ABNORMAL HIGH (ref 65–99)
Potassium: 4.2 mmol/L (ref 3.5–5.3)
Sodium: 139 mmol/L (ref 135–146)
Total Bilirubin: 0.9 mg/dL (ref 0.2–1.2)
Total Protein: 6.1 g/dL (ref 6.1–8.1)

## 2019-12-23 LAB — CBC WITH DIFFERENTIAL/PLATELET
Absolute Monocytes: 409 cells/uL (ref 200–950)
Basophils Absolute: 79 cells/uL (ref 0–200)
Basophils Relative: 1.2 %
Eosinophils Absolute: 231 cells/uL (ref 15–500)
Eosinophils Relative: 3.5 %
HCT: 44.1 % (ref 35.0–45.0)
Hemoglobin: 14.5 g/dL (ref 11.7–15.5)
Lymphs Abs: 2000 cells/uL (ref 850–3900)
MCH: 27.9 pg (ref 27.0–33.0)
MCHC: 32.9 g/dL (ref 32.0–36.0)
MCV: 85 fL (ref 80.0–100.0)
MPV: 10.6 fL (ref 7.5–12.5)
Monocytes Relative: 6.2 %
Neutro Abs: 3881 cells/uL (ref 1500–7800)
Neutrophils Relative %: 58.8 %
Platelets: 221 10*3/uL (ref 140–400)
RBC: 5.19 10*6/uL — ABNORMAL HIGH (ref 3.80–5.10)
RDW: 12.7 % (ref 11.0–15.0)
Total Lymphocyte: 30.3 %
WBC: 6.6 10*3/uL (ref 3.8–10.8)

## 2019-12-23 LAB — VITAMIN D 25 HYDROXY (VIT D DEFICIENCY, FRACTURES): Vit D, 25-Hydroxy: 33 ng/mL (ref 30–100)

## 2019-12-23 NOTE — Progress Notes (Signed)
CBC is normal.  Glucose is elevated.  Please notify patient and forward labs to her PCP.  Calcium is mildly elevated.  Patient should not take calcium more than 1200 mg p.o. daily between dietary supplement and her diet.  Vitamin D is normal.  Liver function is mildly elevated.  Please advise patient not to take any anti-inflammatories.

## 2020-01-18 DIAGNOSIS — E213 Hyperparathyroidism, unspecified: Secondary | ICD-10-CM | POA: Diagnosis not present

## 2020-01-18 DIAGNOSIS — E559 Vitamin D deficiency, unspecified: Secondary | ICD-10-CM | POA: Diagnosis not present

## 2020-01-18 DIAGNOSIS — E039 Hypothyroidism, unspecified: Secondary | ICD-10-CM | POA: Diagnosis not present

## 2020-01-18 DIAGNOSIS — E119 Type 2 diabetes mellitus without complications: Secondary | ICD-10-CM | POA: Diagnosis not present

## 2020-01-18 DIAGNOSIS — M069 Rheumatoid arthritis, unspecified: Secondary | ICD-10-CM | POA: Diagnosis not present

## 2020-02-03 ENCOUNTER — Ambulatory Visit: Payer: PPO | Admitting: Orthopedic Surgery

## 2020-02-16 ENCOUNTER — Other Ambulatory Visit: Payer: Self-pay | Admitting: *Deleted

## 2020-02-16 DIAGNOSIS — M0579 Rheumatoid arthritis with rheumatoid factor of multiple sites without organ or systems involvement: Secondary | ICD-10-CM

## 2020-02-16 MED ORDER — HYDROXYCHLOROQUINE SULFATE 200 MG PO TABS: 200.0000 mg | ORAL_TABLET | Freq: Two times a day (BID) | ORAL | 0 refills | Status: DC

## 2020-02-16 NOTE — Telephone Encounter (Signed)
Refill request received via fax  Last Visit: 12/22/19 Next Visit: 03/23/20 Labs: 12/22/19 CBC is normal. Glucose is elevated. Calcium is mildly elevated Eye exam: 12/17/19 WNL   Current Dose per office note on 12/22/19: PLQ 200 mg BID  Okay to refill per Dr. Estanislado Pandy

## 2020-03-15 NOTE — Progress Notes (Signed)
Office Visit Note  Patient: Linda Atkinson             Date of Birth: 1946/05/24           MRN: [id number]             PCP: Mancel Bale, PA-C Referring: No ref. provider found Visit Date: 03/23/2020 Occupation: @GUAROCC @  Subjective:  Medication monitoring   History of Present Illness: Linda Atkinson is a 74 y.o. female with history of seropositive rheumatoid arthritis and osteoarthritis.  She is taking plaquenil 200 mg 1 tablet by mouth twice daily, which was started in January 2021. She is tolerating PLQ without any side effects.  She states she has noticed a significant improvement in her joint pain and inflammation since starting on PLQ.  She continues to have intermittent pain in both 1st MCP joints.  She has chronic right shoulder joint pain, and she states she was evaluated by her orthopedist recently but she decided against proceeding with a shoulder replacement.    Activities of Daily Living:  Patient reports joint stiffness all day  Patient Denies nocturnal pain.  Difficulty dressing/grooming: Denies Difficulty climbing stairs: Denies Difficulty getting out of chair: Denies Difficulty using hands for taps, buttons, cutlery, and/or writing: Reports  Review of Systems  Constitutional: Positive for fatigue.  HENT: Negative for mouth sores, mouth dryness and nose dryness.   Eyes: Negative for pain, visual disturbance and dryness.  Respiratory: Positive for shortness of breath. Negative for cough, hemoptysis and difficulty breathing.   Cardiovascular: Negative for chest pain, palpitations, hypertension and swelling in legs/feet.  Gastrointestinal: Positive for constipation and diarrhea. Negative for blood in stool.  Endocrine: Negative for increased urination.  Genitourinary: Negative for difficulty urinating and painful urination.  Musculoskeletal: Positive for arthralgias, joint pain, joint swelling, morning stiffness and muscle tenderness. Negative for myalgias, muscle  weakness and myalgias.  Skin: Negative for color change, pallor, rash, hair loss, nodules/bumps, skin tightness, ulcers and sensitivity to sunlight.  Allergic/Immunologic: Negative for susceptible to infections.  Neurological: Negative for dizziness, numbness, headaches and weakness.  Hematological: Negative for bruising/bleeding tendency and swollen glands.  Psychiatric/Behavioral: Positive for sleep disturbance. Negative for depressed mood. The patient is not nervous/anxious.     PMFS History:  Patient Active Problem List   Diagnosis Date Noted  . 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 Rheumatoid arthritis ([place]) 06/18/2017  . Insomnia 08/01/2016  . Diabetes type 2, controlled ([place]) 04/19/2014  . Tobacco abuse 04/18/2014  . Arthralgia 06/29/2013  . Hyperparathyroidism ([place]) 06/11/2013  . Hypothyroidism (acquired) 06/11/2013    Past Medical History:  Diagnosis Date  . Allergy   . Anxiety    about surgery  . Arthritis   . 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 Complication of anesthesia    Sept. 26 2013: "blood pressure dropped"   . Depression    moderate  . GERD (gastroesophageal reflux disease) 1996   hx of  . Pneumonia 2009   hx of  . Rheumatoid arthritis ([place])   . Shortness of breath    with walking  . Thyroid disease     Family History  Problem Relation Age of Onset  . Heart disease Father   . Diabetes Father   . Hyperlipidemia Father   . Kidney disease Father   . Alzheimer's disease Mother   . Cancer Brother        prostate  . Alzheimer's disease Brother   .  Cancer Maternal Aunt        breast  . Cancer Paternal Aunt        uterine  . Cancer Paternal Grandmother        colon  . Healthy Son    Past Surgical History:  Procedure Laterality Date  . ABDOMINAL HYSTERECTOMY  1977  . cataract surgery Bilateral 10/2015  . cyst removed Left 1962   knee  . [place] OF UTERUS  1972  . PARATHYROIDECTOMY Right 07/13/2013    Procedure: RIGHT INFERIOR PARATHYROIDECTOMY;  Surgeon: Ralene Ok, MD;  Location: [facility];  Service: General;  Laterality: Right;  . right shoulder rotater cuff Right 2013   x2   Social History   Social History Narrative   Lives alone         Eating mainly raw foods   walks 5 miles  day   Immunization History  Administered Date(s) Administered  . Pneumococcal-Unspecified 11/06/2011  . Td 11/05/2004     Objective: Vital Signs: BP (!) 182/87 (BP Location: Left Arm, Patient Position: Sitting, Cuff Size: Normal)   Pulse 61   Resp 18   Ht 5\' 7"  (1.702 m)   BMI 37.43 kg/m    Physical Exam Vitals and nursing note reviewed.  Constitutional:      Appearance: She is well-developed.  HENT:     Head: Normocephalic and atraumatic.  Eyes:     Conjunctiva/sclera: Conjunctivae normal.  Pulmonary:     Effort: Pulmonary effort is normal.  Abdominal:     General: Bowel sounds are normal.     Palpations: Abdomen is soft.  Musculoskeletal:     Cervical back: Normal range of motion.  Lymphadenopathy:     Cervical: No cervical adenopathy.  Skin:    General: Skin is warm and dry.     Capillary Refill: Capillary refill takes less than 2 seconds.  Neurological:     Mental Status: She is alert and oriented to person, place, and time.  Psychiatric:        Behavior: Behavior normal.      Musculoskeletal Exam: C-spine, thoracic spine, and lumbar spine good ROM.   Shoulder joints, elbow joints, wrist joints, MCPs, PIPs, and DIPs good ROM with no synovitis.  Complete fist formation bilaterally.  Hip joints, knee joints, ankle joints, MTPs, PIPs, and DIPs good ROM with no synovitis.  No warmth or effusion of knee joints.  No tenderness of swelling of ankle joints.    CDAI Exam: CDAI Score: -- Patient Global: --; Provider Global: -- Swollen: --; Tender: -- Joint Exam 03/23/2020   No joint exam has been documented for this visit   There is currently no information documented on the  homunculus. Go to the Rheumatology activity and complete the homunculus joint exam.  Investigation: No additional findings.  Imaging: No results found.  Recent Labs: Lab Results  Component Value Date   WBC 6.6 12/22/2019   HGB 14.5 12/22/2019   PLT 221 12/22/2019   NA 139 12/22/2019   K 4.2 12/22/2019   CL 106 12/22/2019   CO2 26 12/22/2019   GLUCOSE 201 (H) 12/22/2019   BUN 15 12/22/2019   CREATININE 0.72 12/22/2019   BILITOT 0.9 12/22/2019   ALKPHOS 84 05/23/2018   AST 18 12/22/2019   ALT 30 (H) 12/22/2019   PROT 6.1 12/22/2019   ALBUMIN 4.5 05/23/2018   CALCIUM 10.8 (H) 12/22/2019   GFRAA 97 12/22/2019    Speciality Comments: PLQ eye exam: 12/17/2019 normal. [facility]. Follow up in 6 months.  Procedures:  No procedures performed Allergies: Lipitor [atorvastatin]   Assessment / Plan:     Visit Diagnoses: Rheumatoid arthritis involving multiple sites with positive rheumatoid factor (HCC) - Positive RF in the past, positive anti-CCP and 14 3 3  eta: She has no synovitis on exam today.  She is clinically doing well on Plaquenil 200 mg 1 tablet by mouth twice daily.  She has been tolerating Plaquenil without any side effects and starting in January 2021.  She experiences intermittent pain in bilateral first MCP joints but no inflammation was noted on exam today.  She has chronic pain in the right shoulder and was evaluated by her orthopedist but does not want to proceed with a right shoulder replacement at this time.  She has good range of motion of the right shoulder on exam today.  She will continue taking Plaquenil 200 mg 1 tablet by mouth twice daily.  She does not need a refill at this time.  We will update CBC and CMP today.  She was advised to notify us if she develops increased joint pain or joint swelling.  She will follow-up in the office in 5 months.  High risk medication use - PLQ 200 mg BID, started in January 2021. PLQ eye exam: 12/17/2019.  CBC and CMP  were drawn today to monitor for drug toxicity.- Plan: CBC with Differential/Platelet, COMPLETE METABOLIC PANEL WITH GFR  Primary osteoarthritis of both hands: She has PIP and DIP thickening consistent with osteoarthritis of both hands.  No tenderness or inflammation noted.  She has complete fist formation bilaterally.  Joint protection and muscle strengthening were discussed.  Primary osteoarthritis of both feet: She is not having any discomfort in her feet at this time.  She wears proper fitting shoes.  Other fatigue: Stable  Other medical conditions are listed as follows:   Tobacco abuse  Vitamin D deficiency  Hypothyroidism (acquired)  Hyperparathyroidism ([place])  Other insomnia  Controlled type 2 diabetes mellitus without complication, without long-term current use of insulin ([place])  Orders: Orders Placed This Encounter  Procedures  . CBC with Differential/Platelet  . COMPLETE METABOLIC PANEL WITH GFR   No orders of the defined types were placed in this encounter.    Follow-Up Instructions: Return in about 5 months (around 08/23/2020) for Rheumatoid arthritis, Osteoarthritis.   Ofilia Neas, PA-C  Note - This record has been created using Dragon software.  Chart creation errors have been sought, but may not always  have been located. Such creation errors do not reflect on  the standard of medical care.

## 2020-03-23 ENCOUNTER — Ambulatory Visit: Payer: PPO | Admitting: Physician Assistant

## 2020-03-23 ENCOUNTER — Other Ambulatory Visit: Payer: Self-pay

## 2020-03-23 ENCOUNTER — Encounter: Payer: Self-pay | Admitting: Physician Assistant

## 2020-03-23 VITALS — BP 182/87 | HR 61 | Resp 18 | Ht 67.0 in

## 2020-03-23 DIAGNOSIS — E213 Hyperparathyroidism, unspecified: Secondary | ICD-10-CM | POA: Diagnosis not present

## 2020-03-23 DIAGNOSIS — G4709 Other insomnia: Secondary | ICD-10-CM | POA: Diagnosis not present

## 2020-03-23 DIAGNOSIS — M19041 Primary osteoarthritis, right hand: Secondary | ICD-10-CM | POA: Diagnosis not present

## 2020-03-23 DIAGNOSIS — R5383 Other fatigue: Secondary | ICD-10-CM

## 2020-03-23 DIAGNOSIS — M19071 Primary osteoarthritis, right ankle and foot: Secondary | ICD-10-CM | POA: Diagnosis not present

## 2020-03-23 DIAGNOSIS — M0579 Rheumatoid arthritis with rheumatoid factor of multiple sites without organ or systems involvement: Secondary | ICD-10-CM | POA: Diagnosis not present

## 2020-03-23 DIAGNOSIS — M19072 Primary osteoarthritis, left ankle and foot: Secondary | ICD-10-CM

## 2020-03-23 DIAGNOSIS — M19042 Primary osteoarthritis, left hand: Secondary | ICD-10-CM

## 2020-03-23 DIAGNOSIS — Z79899 Other long term (current) drug therapy: Secondary | ICD-10-CM

## 2020-03-23 DIAGNOSIS — E039 Hypothyroidism, unspecified: Secondary | ICD-10-CM | POA: Diagnosis not present

## 2020-03-23 DIAGNOSIS — E119 Type 2 diabetes mellitus without complications: Secondary | ICD-10-CM | POA: Diagnosis not present

## 2020-03-23 DIAGNOSIS — E559 Vitamin D deficiency, unspecified: Secondary | ICD-10-CM | POA: Diagnosis not present

## 2020-03-23 DIAGNOSIS — Z720 Tobacco use: Secondary | ICD-10-CM | POA: Diagnosis not present

## 2020-03-23 LAB — COMPLETE METABOLIC PANEL WITH GFR
AG Ratio: 1.9 (calc) (ref 1.0–2.5)
ALT: 14 U/L (ref 6–29)
AST: 12 U/L (ref 10–35)
Albumin: 4.2 g/dL (ref 3.6–5.1)
Alkaline phosphatase (APISO): 85 U/L (ref 37–153)
BUN: 12 mg/dL (ref 7–25)
CO2: 29 mmol/L (ref 20–32)
Calcium: 10.7 mg/dL — ABNORMAL HIGH (ref 8.6–10.4)
Chloride: 102 mmol/L (ref 98–110)
Creat: 0.71 mg/dL (ref 0.60–0.93)
GFR, Est African American: 99 mL/min/{1.73_m2} (ref >=60)
GFR, Est Non African American: 85 mL/min/{1.73_m2} (ref >=60)
Globulin: 2.2 g/dL (calc) (ref 1.9–3.7)
Glucose, Bld: 123 mg/dL — ABNORMAL HIGH (ref 65–99)
Potassium: 4.3 mmol/L (ref 3.5–5.3)
Sodium: 137 mmol/L (ref 135–146)
Total Bilirubin: 1 mg/dL (ref 0.2–1.2)
Total Protein: 6.4 g/dL (ref 6.1–8.1)

## 2020-03-23 LAB — CBC WITH DIFFERENTIAL/PLATELET
Absolute Monocytes: 439 cells/uL (ref 200–950)
Basophils Absolute: 92 cells/uL (ref 0–200)
Basophils Relative: 1.5 %
Eosinophils Absolute: 220 cells/uL (ref 15–500)
Eosinophils Relative: 3.6 %
HCT: 41.9 % (ref 35.0–45.0)
Hemoglobin: 13.7 g/dL (ref 11.7–15.5)
Lymphs Abs: 2074 cells/uL (ref 850–3900)
MCH: 27.5 pg (ref 27.0–33.0)
MCHC: 32.7 g/dL (ref 32.0–36.0)
MCV: 84.1 fL (ref 80.0–100.0)
MPV: 11.1 fL (ref 7.5–12.5)
Monocytes Relative: 7.2 %
Neutro Abs: 3276 cells/uL (ref 1500–7800)
Neutrophils Relative %: 53.7 %
Platelets: 191 10*3/uL (ref 140–400)
RBC: 4.98 10*6/uL (ref 3.80–5.10)
RDW: 12.9 % (ref 11.0–15.0)
Total Lymphocyte: 34 %
WBC: 6.1 10*3/uL (ref 3.8–10.8)

## 2020-03-24 NOTE — Progress Notes (Signed)
Glucose is 123. Calcium is elevated-10.7.  Please advise the patient to avoid taking a vitamin D supplement at this time. Rest of CMP WNL.  CBC WNL.

## 2020-03-28 NOTE — Telephone Encounter (Signed)
I spoke with the patient and clarified our recommendations.   According to the patient she has had a history of hypercalcemia for several years due to an underlying parathyroid issue.  She does not take any calcium supplements and tries to avoid foods rich with calcium.  She had a history of vitamin D deficiency. She experiences increased fatigue and myalgias when she is not taking vitamin D. She would like to continue to take vitamin D.  We will continue to monitor lab work closely.

## 2020-04-18 DIAGNOSIS — E119 Type 2 diabetes mellitus without complications: Secondary | ICD-10-CM | POA: Diagnosis not present

## 2020-04-18 DIAGNOSIS — E213 Hyperparathyroidism, unspecified: Secondary | ICD-10-CM | POA: Diagnosis not present

## 2020-04-18 DIAGNOSIS — E039 Hypothyroidism, unspecified: Secondary | ICD-10-CM | POA: Diagnosis not present

## 2020-06-09 ENCOUNTER — Encounter: Payer: Self-pay | Admitting: *Deleted

## 2020-06-09 ENCOUNTER — Encounter: Payer: Self-pay | Admitting: Rheumatology

## 2020-06-09 DIAGNOSIS — R0602 Shortness of breath: Secondary | ICD-10-CM

## 2020-06-09 NOTE — Telephone Encounter (Signed)
Patient started Plaquenil in January 2021 for RA. Her symptoms are not likely related to Plaquenil.  Should we refer her to pulmonology for SOB to rule out lung involvement?  Would you like her to schedule an appointment with our office?

## 2020-06-09 NOTE — Telephone Encounter (Signed)
Attempted to contact the patient and left message for patient to call the office. Urgent Referral placed for pulmonology.

## 2020-06-09 NOTE — Telephone Encounter (Signed)
Please place an urgent referral to pulmonology for evaluation of the SOB she is experiencing.  If her SOB worsens please advise the patient to go to the emergency department immediately.   Ok to schedule a follow up visit in our office to further discuss PLQ vs. Other treatment options.

## 2020-06-10 ENCOUNTER — Other Ambulatory Visit: Payer: Self-pay

## 2020-06-10 ENCOUNTER — Ambulatory Visit (INDEPENDENT_AMBULATORY_CARE_PROVIDER_SITE_OTHER): Payer: PPO

## 2020-06-10 ENCOUNTER — Ambulatory Visit: Payer: PPO | Admitting: Pulmonary Disease

## 2020-06-10 ENCOUNTER — Encounter: Payer: Self-pay | Admitting: Pulmonary Disease

## 2020-06-10 VITALS — BP 136/84 | HR 75 | Ht 67.0 in

## 2020-06-10 DIAGNOSIS — R0600 Dyspnea, unspecified: Secondary | ICD-10-CM | POA: Diagnosis not present

## 2020-06-10 DIAGNOSIS — M05719 Rheumatoid arthritis with rheumatoid factor of unspecified shoulder without organ or systems involvement: Secondary | ICD-10-CM | POA: Diagnosis not present

## 2020-06-10 DIAGNOSIS — E669 Obesity, unspecified: Secondary | ICD-10-CM

## 2020-06-10 DIAGNOSIS — J849 Interstitial pulmonary disease, unspecified: Secondary | ICD-10-CM

## 2020-06-10 DIAGNOSIS — R0609 Other forms of dyspnea: Secondary | ICD-10-CM

## 2020-06-10 NOTE — Patient Instructions (Addendum)
-   We will order a pulmonary function test and chest x-ray - Based on the pulmonary function test and chest x-ray results we will consider a CT Chest Scan - We will refer you to the [facility]  - We will schedule follow up in 2 months

## 2020-06-10 NOTE — Telephone Encounter (Signed)
Appointment for 06/13/2020 has been cancelled.

## 2020-06-10 NOTE — Telephone Encounter (Signed)
I spoke with patient and discussed that weight gain is unlikely to be due to hydroxychloroquine.  She was seen by pulmonologist today.  She is getting Covid test.  She is also getting PFTs.  Please cancel Monday appointment.  We will reschedule after she completes pulmonary work-up.  She has an appointment in October.

## 2020-06-13 ENCOUNTER — Other Ambulatory Visit (HOSPITAL_COMMUNITY)
Admission: RE | Admit: 2020-06-13 | Discharge: 2020-06-13 | Disposition: A | Discharge status: Home / Self Care | Payer: PPO | Source: Ambulatory Visit | Attending: Pulmonary Disease | Admitting: Pulmonary Disease

## 2020-06-13 ENCOUNTER — Ambulatory Visit: Payer: PPO | Admitting: Physician Assistant

## 2020-06-13 ENCOUNTER — Telehealth: Payer: Self-pay | Admitting: Pulmonary Disease

## 2020-06-13 DIAGNOSIS — Z01812 Encounter for preprocedural laboratory examination: Secondary | ICD-10-CM | POA: Insufficient documentation

## 2020-06-13 DIAGNOSIS — J849 Interstitial pulmonary disease, unspecified: Secondary | ICD-10-CM

## 2020-06-13 DIAGNOSIS — Z20822 Contact with and (suspected) exposure to covid-19: Secondary | ICD-10-CM | POA: Insufficient documentation

## 2020-06-13 LAB — SARS CORONAVIRUS 2 (TAT 6-24 HRS): SARS Coronavirus 2: NEGATIVE

## 2020-06-13 NOTE — Telephone Encounter (Signed)
Called spoke with patient let them know I would have the provider release the result note and we would call patient back. Patient would like you to look at the Shoulder CT 11/30/19 for the lung nodules.   Dr. Erin Fulling please advise on patient's chest xray done 06/10/20

## 2020-06-13 NOTE — Telephone Encounter (Signed)
I spoke with the patient this evening and informed her of the abnormal chest x-ray results. We will check a hi-res chest CT and order an inflammatory/autoimmune panel. She is coming in on Thursday for PFTs and she will have her labs drawn then. I told her our office will be in touch about the date/time for the CT chest.  Thanks, Wille Glaser

## 2020-06-14 NOTE — Progress Notes (Signed)
Patient ID: Linda Atkinson, female    DOB: [birth year], 74 y.o.   MRN: [id number]  Chief Complaint  Patient presents with  . Consult    Referred by rheumatology.  c/o increased sob, fatigue X5 mos.  Pt has had 30lb weight gain in that time frame. Also notes she had PNA 07/2019.    Referring provider: Ofilia Neas, PA-C  HPI: Linda Atkinson is a 74 year old woman, former smoker with rheumatoid arthritis and history of GERD who is referred to pulmonary clinic for progressive shortness of breath.   She was seen by rheumatology 10/2019 for multiple joint pains and a positive rheumatoid factor checked by her PCP. Anti-CCP antibody was >250 (strong positive >59) and 14-3-3 eta Protein was 7.7ng/mL (normal <0.2ng/mL). ANA was negative and CK was 35U/L. She had tenderness and inflamattion of the left wrise and multiple left MCP joints at that time. She was started on plaquenil in 11/2019 with reported improvement in her joint stiffness and pain. Since starting the plaquenil she has noticed increasing shortness of breath to the point she reports she becomes dyspneic walking from the parking lot into our clinic.   She does not feel short of breath at rest, it is mainly with exertion. She denies chest pain, orthopnea, paroxysmal nocturnal dyspnea, or leg swelling. She denies cough, productive or dry and does not have wheezing. She has gained approximately 20-30lbs in recent months. She does report snoring at night and a family member has expressed concern for apneas in the past. She denies dry eyes or dry mouth. She denies any skin rashes or joint swellings.      In regards to her smoking history, she recently quit in 02/2020. She has a 25 pack year history.   No PFTs on file. No updated chest imaging from this year. CT neck 07/03/2013 showed normal lung apices. CT of right shoulder 11/30/2019 was notable for multiple 2-84m pulmonary nodules of the right upper lobe.         Allergies  Allergen Reactions  .  Lipitor [Atorvastatin] Other (See Comments)    Muscle pain.    Immunization History  Administered Date(s) Administered  . Pneumococcal-Unspecified 11/06/2011  . Td 11/05/2004    Past Medical History:  Diagnosis Date  . Allergy   . Anxiety    about surgery  . Arthritis   . 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 Complication of anesthesia    Sept. 26 2013: "blood pressure dropped"   . Depression    moderate  . GERD (gastroesophageal reflux disease) 1996   hx of  . Pneumonia 2009   hx of  . [place]   . Shortness of breath    with walking  . Thyroid disease     Tobacco History: Social History   Tobacco Use  Smoking Status Former Smoker  . Packs/day: 0.50  . Years: 50.00  . Pack years: 25.00  . Types: Cigarettes  . Quit date: 02/16/2020  . Years since quitting: 0.3  Smokeless Tobacco Never Used   Counseling given: Not Answered   Outpatient Medications Prior to Visit  Medication Sig Dispense Refill  . Cyanocobalamin (VITAMIN B-12) 2500 MCG SUBL Take 1 tablet by mouth every other day.    . hydroxychloroquine (PLAQUENIL) 200 MG tablet Take 1 tablet (200 mg total) by mouth 2 (two) times daily. 180 tablet 0  . metFORMIN (GLUCOPHAGE) 500 MG tablet Take 500 mg by mouth  2 (two) times daily.    . naproxen sodium (ALEVE) 220 MG tablet Take 220 mg by mouth as needed.    Marland Kitchen SYNTHROID 175 MCG tablet Take 175 mcg by mouth daily.    . Vitamin D, Ergocalciferol, (DRISDOL) 1.25 MG (50000 UNIT) CAPS capsule Take 50,000 Units by mouth once a week.     No facility-administered medications prior to visit.    Review of Systems  Constitutional: Positive for malaise/fatigue. Negative for chills, diaphoresis, fever and weight loss.  HENT: Negative for congestion, ear pain, hearing loss, nosebleeds, sinus pain, sore throat and tinnitus.   Eyes: Negative for discharge and redness.  Respiratory: Positive for shortness of breath. Negative for cough,  hemoptysis, sputum production and wheezing.   Cardiovascular: Negative for chest pain, palpitations, orthopnea, leg swelling and PND.  Gastrointestinal: Negative for abdominal pain, blood in stool, constipation, diarrhea, heartburn, nausea and vomiting.  Genitourinary: Negative for dysuria and hematuria.  Musculoskeletal: Negative for joint pain and myalgias.  Skin: Negative for itching and rash.  Neurological: Negative for dizziness, tingling, sensory change, focal weakness, weakness and headaches.  Endo/Heme/Allergies: Does not bruise/bleed easily.  Psychiatric/Behavioral: Negative for depression, hallucinations and substance abuse.      Physical Exam  BP 136/84 (BP Location: Left Wrist, Cuff Size: Normal)   Pulse 75   Ht _0  (1.702 m)   SpO2 97%   BMI 37.43 kg/m   GEN: A/Ox3; pleasant , NAD, well nourished    HEENT:  [place]/AT, NOSE-clear, THROAT-clear, no lesions, no postnasal drip or exudate noted.   NECK:  Supple w/ fair ROM; no JVD; normal carotid impulses w/o bruits; no thyromegaly or nodules palpated; no lymphadenopathy.    RESP  Clear  P & A; w/o, wheezes/ rales/ or rhonchi. no accessory muscle use, no dullness to percussion. No dry rales or squeaks.  CARD:  RRR, no m/r/g, no peripheral edema, pulses intact, no cyanosis or clubbing.  GI:   Soft & nt; nml bowel sounds; no organomegaly or masses detected.   Musco: Warm bil, no deformities or joint swelling noted.   Neuro: alert, no focal deficits noted.    Skin: Warm, no lesions or rashes   Lab Results:  CBC    Component Value Date/Time   WBC 6.1 03/23/2020 1320   RBC 4.98 03/23/2020 1320   HGB 13.7 03/23/2020 1320   HGB 14.2 05/23/2018 1503   HCT 41.9 03/23/2020 1320   HCT 42.7 05/23/2018 1503   PLT 191 03/23/2020 1320   PLT 192 05/23/2018 1503   MCV 84.1 03/23/2020 1320   MCV 87 05/23/2018 1503   MCH 27.5 03/23/2020 1320   MCHC 32.7 03/23/2020 1320   RDW 12.9 03/23/2020 1320   RDW 13.6 05/23/2018  1503   LYMPHSABS 2,074 03/23/2020 1320   LYMPHSABS 2.5 05/23/2018 1503   EOSABS 220 03/23/2020 1320   EOSABS 0.2 05/23/2018 1503   BASOSABS 92 03/23/2020 1320   BASOSABS 0.1 05/23/2018 1503    BMET    Component Value Date/Time   NA 137 03/23/2020 1320   NA 136 05/23/2018 1521   K 4.3 03/23/2020 1320   CL 102 03/23/2020 1320   CO2 29 03/23/2020 1320   GLUCOSE 123 (H) 03/23/2020 1320   BUN 12 03/23/2020 1320   BUN 8 05/23/2018 1521   CREATININE 0.71 03/23/2020 1320   CALCIUM 10.7 (H) 03/23/2020 1320   GFRNONAA 85 03/23/2020 1320   GFRAA 99 03/23/2020 1320    BNP No results found for: BNP  ProBNP No results found for: PROBNP  Imaging: DG Chest 2 View  Result Date: 06/10/2020 CLINICAL DATA:  Dyspnea. EXAM: CHEST - 2 VIEW COMPARISON:  October 19, 2007 FINDINGS: Mild to moderate severity diffusely increased interstitial lung markings are noted. There is no evidence of a pleural effusion or pneumothorax. The heart size and mediastinal contours are within normal limits. There is moderate severity calcification of the aortic arch. The visualized skeletal structures are unremarkable. IMPRESSION: Mild to moderate severity diffusely increased interstitial lung markings, consistent with interstitial edema. Electronically Signed   By: Virgina Norfolk M.D.   On: 06/10/2020 15:18    Assessment & Plan:   Linda Atkinson is a 74 year old woman, former smoker with rheumatoid arthritis and history of GERD who is referred to pulmonary clinic for progressive shortness of breath.   Given her recent diagnosis of rheumatoid arthritis and chest radiograph on 06/10/20 showing increased interstitial markings, rheumatoid arthritis related ILD can be considered in the differential. Plaquenil is not reported to be a very common medication to cause lung toxicity, however other medications used to treat RA have been known to cause drug-induced pulmonary disease in RA patients such as NSAIDs which she takes as  needed. Given her smoking history, there could also be smoking related interstitial lung disease. We will also need to rule out congestive heart failure given the interstitial markings on chest x-ray could be cardiogenic pulmonary edema.   1. Dyspnea Unknown etiology at this time, differential listed above. - Check high-resolution CT chest scan - Check BNP, CBC w/diff, ANA, ANCA, anti DS-DNA, SSA, SSB, CCP, ESR, CRP, myositis panel - Check quantiferon gold in case immune suppression treatment will be needed - Check cardiac ECHO  2. Rheumatoid Arthritis - Patient has been instructed to avoid NSAIDs (naproxen) until further workup is completed - Can continue plaquenil for now  3. Obesity - Will refer patient to the [facility] clinic - She is at high risk for obstructive sleep apnea based on her clinical history. Will consider sleep study in the future after the acute workup is completed.    Linda Starr, MD 06/14/2020

## 2020-06-14 NOTE — Telephone Encounter (Signed)
I ordered the labs and the high-res CT this morning. The CT is for interstitial lung disease, patient has rheumatoid arthritis. Please schedule at the earliest time possible.

## 2020-06-16 ENCOUNTER — Other Ambulatory Visit: Payer: Self-pay

## 2020-06-16 ENCOUNTER — Ambulatory Visit (INDEPENDENT_AMBULATORY_CARE_PROVIDER_SITE_OTHER): Payer: PPO | Admitting: Pulmonary Disease

## 2020-06-16 ENCOUNTER — Other Ambulatory Visit (INDEPENDENT_AMBULATORY_CARE_PROVIDER_SITE_OTHER): Payer: PPO

## 2020-06-16 DIAGNOSIS — R0609 Other forms of dyspnea: Secondary | ICD-10-CM

## 2020-06-16 DIAGNOSIS — R0600 Dyspnea, unspecified: Secondary | ICD-10-CM | POA: Diagnosis not present

## 2020-06-16 DIAGNOSIS — J849 Interstitial pulmonary disease, unspecified: Secondary | ICD-10-CM

## 2020-06-16 LAB — C-REACTIVE PROTEIN: CRP: <1 mg/dL (ref 0.5–20.0)

## 2020-06-16 LAB — BRAIN NATRIURETIC PEPTIDE: Pro B Natriuretic peptide (BNP): 104 pg/mL — ABNORMAL HIGH (ref 0.0–100.0)

## 2020-06-16 LAB — SEDIMENTATION RATE: Sed Rate: 14 mm/hr (ref 0–30)

## 2020-06-16 NOTE — Progress Notes (Signed)
PFT done today. 

## 2020-06-17 ENCOUNTER — Ambulatory Visit (HOSPITAL_COMMUNITY): Payer: PPO

## 2020-06-17 ENCOUNTER — Ambulatory Visit (INDEPENDENT_AMBULATORY_CARE_PROVIDER_SITE_OTHER)
Admission: RE | Admit: 2020-06-17 | Discharge: 2020-06-17 | Disposition: A | Discharge status: Home / Self Care | Payer: PPO | Source: Ambulatory Visit | Attending: Pulmonary Disease | Admitting: Pulmonary Disease

## 2020-06-17 DIAGNOSIS — I700 Atherosclerosis of aorta: Secondary | ICD-10-CM | POA: Diagnosis not present

## 2020-06-17 DIAGNOSIS — J849 Interstitial pulmonary disease, unspecified: Secondary | ICD-10-CM | POA: Diagnosis not present

## 2020-06-17 DIAGNOSIS — I2510 Atherosclerotic heart disease of native coronary artery without angina pectoris: Secondary | ICD-10-CM | POA: Diagnosis not present

## 2020-06-17 DIAGNOSIS — M19011 Primary osteoarthritis, right shoulder: Secondary | ICD-10-CM | POA: Diagnosis not present

## 2020-06-17 LAB — CYCLIC CITRUL PEPTIDE ANTIBODY, IGG: Cyclic Citrullin Peptide Ab: >250 UNITS — ABNORMAL HIGH

## 2020-06-17 LAB — ANCA SCREEN W REFLEX TITER: ANCA Screen: NEGATIVE

## 2020-06-20 ENCOUNTER — Ambulatory Visit (HOSPITAL_COMMUNITY)
Admission: RE | Admit: 2020-06-20 | Discharge: 2020-06-20 | Disposition: A | Discharge status: Home / Self Care | Payer: PPO | Source: Ambulatory Visit | Attending: Pulmonary Disease | Admitting: Pulmonary Disease

## 2020-06-20 ENCOUNTER — Other Ambulatory Visit: Payer: Self-pay

## 2020-06-20 DIAGNOSIS — R0600 Dyspnea, unspecified: Secondary | ICD-10-CM | POA: Insufficient documentation

## 2020-06-20 DIAGNOSIS — K219 Gastro-esophageal reflux disease without esophagitis: Secondary | ICD-10-CM | POA: Insufficient documentation

## 2020-06-20 DIAGNOSIS — R0609 Other forms of dyspnea: Secondary | ICD-10-CM

## 2020-06-20 NOTE — Progress Notes (Signed)
  Echocardiogram 2D Echocardiogram has been performed.  Linda Atkinson 06/20/2020, 4:26 PM

## 2020-06-21 ENCOUNTER — Telehealth: Payer: Self-pay | Admitting: Pulmonary Disease

## 2020-06-21 ENCOUNTER — Other Ambulatory Visit: Payer: Self-pay | Admitting: Rheumatology

## 2020-06-21 DIAGNOSIS — Z5181 Encounter for therapeutic drug level monitoring: Secondary | ICD-10-CM

## 2020-06-21 DIAGNOSIS — R0600 Dyspnea, unspecified: Secondary | ICD-10-CM

## 2020-06-21 DIAGNOSIS — R0683 Snoring: Secondary | ICD-10-CM

## 2020-06-21 DIAGNOSIS — M0579 Rheumatoid arthritis with rheumatoid factor of multiple sites without organ or systems involvement: Secondary | ICD-10-CM

## 2020-06-21 LAB — PULMONARY FUNCTION TEST
DL/VA % pred: 94 %
DL/VA: 3.85 ml/min/mmHg/L
DLCO cor % pred: 78 %
DLCO cor: 16.15 ml/min/mmHg
DLCO unc % pred: 78 %
DLCO unc: 16.15 ml/min/mmHg
FEF 25-75 Post: 2.82 L/sec
FEF 25-75 Pre: 2.08 L/sec
FEF2575-%Change-Post: 35 %
FEF2575-%Pred-Post: 150 %
FEF2575-%Pred-Pre: 110 %
FEV1-%Change-Post: 8 %
FEV1-%Pred-Post: 91 %
FEV1-%Pred-Pre: 84 %
FEV1-Post: 2.17 L
FEV1-Pre: 2.01 L
FEV1FVC-%Change-Post: 4 %
FEV1FVC-%Pred-Pre: 107 %
FEV6-%Change-Post: 4 %
FEV6-%Pred-Post: 86 %
FEV6-%Pred-Pre: 82 %
FEV6-Post: 2.58 L
FEV6-Pre: 2.46 L
FEV6FVC-%Pred-Post: 104 %
FEV6FVC-%Pred-Pre: 104 %
FVC-%Change-Post: 3 %
FVC-%Pred-Post: 82 %
FVC-%Pred-Pre: 79 %
FVC-Post: 2.58 L
FVC-Pre: 2.48 L
Post FEV1/FVC ratio: 84 %
Post FEV6/FVC ratio: 100 %
Pre FEV1/FVC ratio: 81 %
Pre FEV6/FVC Ratio: 100 %
RV % pred: 128 %
RV: 3.02 L
TLC % pred: 107 %
TLC: 5.76 L

## 2020-06-21 LAB — ECHOCARDIOGRAM COMPLETE
Area-P 1/2: 2.34 cm2
S' Lateral: 2.8 cm

## 2020-06-21 MED ORDER — FUROSEMIDE 20 MG PO TABS: 10.0000 mg | ORAL_TABLET | Freq: Every day | ORAL | 0 refills | Status: DC

## 2020-06-21 NOTE — Telephone Encounter (Signed)
Discussed the lab, imaging and echo results from our recent visit with the patient. The labs and high res CT chest scan are not consistent with an interstitial lung disease. Her BNP is slightly elevated while her cardiac echo is normal. She has completed PFTs last week and I will follow up on those results once those are available.   Given these results, we will move forward with a home sleep study to evaluate for sleep apnea. She also believes her weight is adding to her dyspnea and will be working with the [facility]. For the elevated BNP and shortness of breath, we will trial her on 10mg  of lasix daily with follow up labs in 7-10 days to monitor electrolytes and kidney function.     Freda Jackson, MD [facility] Office: [phone]

## 2020-06-21 NOTE — Telephone Encounter (Signed)
Last Visit: 03/23/2020 Next Visit: 08/24/2020 Labs: 03/23/2020 Glucose is 123. Calcium is elevated-10.7. Rest of CMP WNL. CBC WNL.  Eye exam: 12/17/2019 normal.   Current Dose per office note 03/23/2020: PLQ 200 mg BID YT:[id number] arthritis involving multiple sites with positive rheumatoid factor   Okay to refill Plaquenil?

## 2020-06-24 ENCOUNTER — Telehealth: Payer: Self-pay | Admitting: Pulmonary Disease

## 2020-06-24 NOTE — Telephone Encounter (Signed)
Dr. Erin Fulling please advise on PFT results from 06/16/20.  Report from Gwyneth Revels has been uploaded to [employer].  Thanks!

## 2020-06-27 NOTE — Telephone Encounter (Signed)
Patient states needs results for PFT. Patient phone number is [phone].

## 2020-06-27 NOTE — Telephone Encounter (Signed)
Spoke with the pt  She is asking for sleep study results  I advised will send msg to Dr Erin Fulling to ask about these results  She asked about her home sleep study being scheduled I advised this was ordered and they are unfortunately booked out 3-4 wks and she will getting a call  She expressed that she was very displeased with this and I apologized for the inconvenient wait  She also states that she is displeased with the lack of content in her office note which she reviewed in mychart  She would like for more information to be in her note for her to share with her other physicians as well as her family  Will forward to Dr Erin Fulling to make him aware and also ask about PFT results  Thank you

## 2020-07-04 ENCOUNTER — Ambulatory Visit (INDEPENDENT_AMBULATORY_CARE_PROVIDER_SITE_OTHER): Payer: PPO | Admitting: Bariatrics

## 2020-07-04 ENCOUNTER — Encounter (INDEPENDENT_AMBULATORY_CARE_PROVIDER_SITE_OTHER): Payer: Self-pay | Admitting: Bariatrics

## 2020-07-04 ENCOUNTER — Other Ambulatory Visit: Payer: Self-pay

## 2020-07-04 VITALS — BP 148/86 | HR 75 | Temp 97.9°F | Ht 66.0 in | Wt 264.0 lb

## 2020-07-04 DIAGNOSIS — Z0289 Encounter for other administrative examinations: Secondary | ICD-10-CM

## 2020-07-04 DIAGNOSIS — E559 Vitamin D deficiency, unspecified: Secondary | ICD-10-CM | POA: Diagnosis not present

## 2020-07-04 DIAGNOSIS — E1169 Type 2 diabetes mellitus with other specified complication: Secondary | ICD-10-CM

## 2020-07-04 DIAGNOSIS — E669 Obesity, unspecified: Secondary | ICD-10-CM | POA: Diagnosis not present

## 2020-07-04 DIAGNOSIS — M059 Rheumatoid arthritis with rheumatoid factor, unspecified: Secondary | ICD-10-CM

## 2020-07-04 DIAGNOSIS — E213 Hyperparathyroidism, unspecified: Secondary | ICD-10-CM

## 2020-07-04 DIAGNOSIS — Z6841 Body Mass Index (BMI) 40.0 and over, adult: Secondary | ICD-10-CM | POA: Diagnosis not present

## 2020-07-04 DIAGNOSIS — E039 Hypothyroidism, unspecified: Secondary | ICD-10-CM

## 2020-07-04 DIAGNOSIS — R5383 Other fatigue: Secondary | ICD-10-CM | POA: Diagnosis not present

## 2020-07-04 DIAGNOSIS — Z1331 Encounter for screening for depression: Secondary | ICD-10-CM | POA: Diagnosis not present

## 2020-07-04 DIAGNOSIS — R0602 Shortness of breath: Secondary | ICD-10-CM | POA: Diagnosis not present

## 2020-07-04 NOTE — Progress Notes (Signed)
Dear Dr. Freda Jackson,   Thank you for referring Linda Atkinson to our clinic. The following note includes my evaluation and treatment recommendations.  Chief Complaint:   OBESITY Linda Atkinson (MR# [id number]) is a 74 y.o. female who presents for evaluation and treatment of obesity and related comorbidities. Current BMI is Body mass index is 42.61 kg/m.[name] has been struggling with her weight for many years and has been unsuccessful in either losing weight, maintaining weight loss, or reaching her healthy weight goal.  [name] is currently in the action stage of change and ready to dedicate time achieving and maintaining a healthier weight. [name] is interested in becoming our patient and working on intensive lifestyle modifications including (but not limited to) diet and exercise for weight loss.  [name] does not like to cook as she lives by herself. She craves peanut butter. She skips meals. She eats out a lot.  [name]'s habits were reviewed today and are as follows: her desired weight loss is 94 lbs, her heaviest weight ever was 280 pounds, she craves peanut butter, she snacks sometimes in the evenings, she skips breakfast almost daily, she sometimes makes poor food choices, she has binge eating behaviors and she struggles with emotional eating.  Depression Screen [name]'s Food and Mood (modified PHQ-9) score was 12.  Depression screen PHQ 2/9 07/04/2020  Decreased Interest 3  Down, Depressed, Hopeless 1  PHQ - 2 Score 4  Altered sleeping 2  Tired, decreased energy 3  Change in appetite 1  Feeling bad or failure about yourself  1  Trouble concentrating 0  Moving slowly or fidgety/restless 1  Suicidal thoughts 0  PHQ-9 Score 12  Difficult doing work/chores Not difficult at all   Subjective:   Other fatigue, Cliffie denies daytime somnolence and denies waking up still tired. [name] generally gets 6 or 7 hours of sleep per night, and states that she has generally restful  sleep. Snoring is present. Apneic episodes are not present. Epworth Sleepiness Score is 6.  SOB (shortness of breath) on exertion. [name] notes increasing shortness of breath with certain activities and seems to be worsening over time with weight gain. She notes getting out of breath sooner with activity than she used to. This has gotten worse recently. [name] denies shortness of breath at rest or orthopnea.  Diabetes mellitus type 2 in obese ([place]). [name] is taking metformin. A1c was reported to be 6.8 on 04/18/2020.   Lab Results  Component Value Date   HGBA1C 5.9 (H) 05/23/2018   HGBA1C 5.3 08/01/2016   HGBA1C 5.7 (H) 09/01/2015   Lab Results  Component Value Date   LDLCALC 135 (H) 05/23/2018   CREATININE 0.71 03/23/2020   No results found for: INSULIN  Hypothyroidism (acquired). [name] is taking Synthroid.   Lab Results  Component Value Date   TSH 0.328 (L) 05/23/2018   Hyperparathyroidism ([place]). [name] had parathyroidectomy for one gland. No calcium supplements.  Rheumatoid arthritis with positive rheumatoid factor, involving unspecified site ([place]). [name] is taking hydroxychloroquine. She reports intermittent pain.  Vitamin D deficiency. [name] is taking high dose Vitamin D supplementation.    Ref. Range 12/22/2019 13:40  Vitamin D, 25-Hydroxy Latest Ref Range: 30 - 100 ng/mL 33   Depression screening. [name] has a moderately positive depression screen with a PHQ-9 score of 12.  Assessment/Plan:   Other fatigue. [name] does feel that her weight is causing her energy to be lower than it should be. Fatigue may be related to  obesity, depression or many other causes. Labs will be ordered, and in the meanwhile, [name] will focus on self care including making healthy food choices, increasing physical activity and focusing on stress reduction. EKG 12-Lead performed today.  SOB (shortness of breath) on exertion. [name] does feel that she gets out of breath more easily that she  used to when she exercises. [name]'s shortness of breath appears to be obesity related and exercise induced. She has agreed to work on weight loss and gradually increase exercise to treat her exercise induced shortness of breath. Will continue to monitor closely.  Diabetes mellitus type 2 in obese ([place]). Good blood sugar control is important to decrease the likelihood of diabetic complications such as nephropathy, neuropathy, limb loss, blindness, coronary artery disease, and death. Intensive lifestyle modification including diet, exercise and weight loss are the first line of treatment for diabetes. [name] will continue her medication as directed.   Hypothyroidism (acquired). Patient with long-standing hypothyroidism, on levothyroxine therapy. She appears euthyroid. Orders and follow up as documented in patient record. [name] will continue her medication as directed.   Counseling . Good thyroid control is important for overall health. Supratherapeutic thyroid levels are dangerous and will not improve weight loss results. . The correct way to take levothyroxine is fasting, with water, separated by at least 30 minutes from breakfast, and separated by more than 4 hours from calcium, iron, multivitamins, acid reflux medications (PPIs).   Hyperparathyroidism ([place]). PCP will follow.  Rheumatoid arthritis with positive rheumatoid factor, involving unspecified site ([place]). [name] will continue her medication as directed and follow-up with her hematologist as scheduled.  Vitamin D deficiency. Low Vitamin D level contributes to fatigue and are associated with obesity, breast, and colon cancer. She agrees to continue to take Vitamin D as directed.    Depression screening. [name] had a positive depression screening. Depression is commonly associated with obesity and often results in emotional eating behaviors. We will monitor this closely and work on CBT to help improve the non-hunger eating patterns. Referral  to Psychology may be required if no improvement is seen as she continues in our clinic.  Class 3 severe obesity due to excess calories with serious comorbidity and body mass index (BMI) of 40.0 to 44.9 in adult [facility]).  Shanteria is currently in the action stage of change and her goal is to continue with weight loss efforts. I recommend Adaysha begin the structured treatment plan as follows:  She has agreed to the Category 4 Plan.  She will work on meal planning.  We independently reviewed with the patient labs from 03/23/2020 including CMP, CBC, and glucose and from 04/25/2020 including CMP, lipid panel, ad A1c.  Exercise goals: Older adults should follow the adult guidelines. When older adults cannot meet the adult guidelines, they should be as physically active as their abilities and conditions will allow.    Behavioral modification strategies: increasing lean protein intake, decreasing simple carbohydrates, increasing vegetables, increasing water intake, decreasing eating out, no skipping meals, meal planning and cooking strategies, keeping healthy foods in the home and planning for success.  She was informed of the importance of frequent follow-up visits to maximize her success with intensive lifestyle modifications for her multiple health conditions. She was informed we would discuss her lab results at her next visit unless there is a critical issue that needs to be addressed sooner. Ayzia agreed to keep her next visit at the agreed upon time to discuss these results.  Objective:   Blood pressure Marland Kitchen)  148/86, pulse 75, temperature 97.9 F (36.6 C), height 5\' 6"  (1.676 m), weight 264 lb (119.7 kg), SpO2 95 %. Body mass index is 42.61 kg/m.  EKG: Sinus  Rhythm with a rate of 75 BPM. Left axis - anterior fascicular block. Poor R wave progression. Otherwise normal.   Indirect Calorimeter completed today shows a VO2 of 353 and a REE of 2452.  Her calculated basal metabolic rate is 1287 thus her  basal metabolic rate is better than expected.  General: Cooperative, alert, well developed, in no acute distress. HEENT: Conjunctivae and lids unremarkable. Cardiovascular: Regular rhythm.  Lungs: Normal work of breathing. Neurologic: No focal deficits.   Lab Results  Component Value Date   CREATININE 0.71 03/23/2020   BUN 12 03/23/2020   NA 137 03/23/2020   K 4.3 03/23/2020   CL 102 03/23/2020   CO2 29 03/23/2020   Lab Results  Component Value Date   ALT 14 03/23/2020   AST 12 03/23/2020   ALKPHOS 84 05/23/2018   BILITOT 1.0 03/23/2020   Lab Results  Component Value Date   HGBA1C 5.9 (H) 05/23/2018   HGBA1C 5.3 08/01/2016   HGBA1C 5.7 (H) 09/01/2015   HGBA1C 7.5 06/17/2014   HGBA1C 8.2 (H) 04/16/2014   No results found for: INSULIN Lab Results  Component Value Date   TSH 0.328 (L) 05/23/2018   Lab Results  Component Value Date   CHOL 209 (H) 05/23/2018   HDL 41 05/23/2018   LDLCALC 135 (H) 05/23/2018   TRIG 167 (H) 05/23/2018   CHOLHDL 5.1 (H) 05/23/2018   Lab Results  Component Value Date   WBC 6.1 03/23/2020   HGB 13.7 03/23/2020   HCT 41.9 03/23/2020   MCV 84.1 03/23/2020   PLT 191 03/23/2020   No results found for: IRON, TIBC, FERRITIN  Obesity Behavioral Intervention Visit Documentation for Insurance:   Approximately 15 minutes were spent on the discussion below.  ASK: We discussed the diagnosis of obesity with Margaretha Sheffield today and Myiesha agreed to give [place] permission to discuss obesity behavioral modification therapy today.  ASSESS: Gerald has the diagnosis of obesity and her BMI today is 42.7. Christon is in the action stage of change.   ADVISE: Keniyah was educated on the multiple health risks of obesity as well as the benefit of weight loss to improve her health. She was advised of the need for long term treatment and the importance of lifestyle modifications to improve her current health and to decrease her risk of future health  problems.  AGREE: Multiple dietary modification options and treatment options were discussed and Aanyah agreed to follow the recommendations documented in the above note.  ARRANGE: Rhapsody was educated on the importance of frequent visits to treat obesity as outlined per CMS and USPSTF guidelines and agreed to schedule her next follow up appointment today.  Attestation Statements:   Reviewed by clinician on day of visit: allergies, medications, problem list, medical history, surgical history, family history, social history, and previous encounter notes.  Migdalia Dk, am acting as Location manager for [employer], DO   I have reviewed the above documentation for accuracy and completeness, and I agree with the above. Jearld Lesch, DO

## 2020-07-05 ENCOUNTER — Encounter (INDEPENDENT_AMBULATORY_CARE_PROVIDER_SITE_OTHER): Payer: Self-pay | Admitting: Bariatrics

## 2020-07-05 LAB — VITAMIN D 25 HYDROXY (VIT D DEFICIENCY, FRACTURES): Vit D, 25-Hydroxy: 25.6 ng/mL — ABNORMAL LOW (ref 30.0–100.0)

## 2020-07-05 LAB — INSULIN, RANDOM: INSULIN: 17 u[IU]/mL (ref 2.6–24.9)

## 2020-07-05 LAB — TSH: TSH: 1.98 u[IU]/mL (ref 0.450–4.500)

## 2020-07-05 LAB — T3: T3, Total: 103 ng/dL (ref 71–180)

## 2020-07-05 LAB — T4, FREE: Free T4: 1.67 ng/dL (ref 0.82–1.77)

## 2020-07-12 ENCOUNTER — Other Ambulatory Visit: Payer: Self-pay

## 2020-07-12 ENCOUNTER — Ambulatory Visit: Payer: PPO

## 2020-07-12 DIAGNOSIS — G4733 Obstructive sleep apnea (adult) (pediatric): Secondary | ICD-10-CM | POA: Diagnosis not present

## 2020-07-12 DIAGNOSIS — R0683 Snoring: Secondary | ICD-10-CM

## 2020-07-14 DIAGNOSIS — G4733 Obstructive sleep apnea (adult) (pediatric): Secondary | ICD-10-CM | POA: Diagnosis not present

## 2020-07-18 ENCOUNTER — Ambulatory Visit (INDEPENDENT_AMBULATORY_CARE_PROVIDER_SITE_OTHER): Payer: PPO | Admitting: Bariatrics

## 2020-07-18 ENCOUNTER — Encounter (INDEPENDENT_AMBULATORY_CARE_PROVIDER_SITE_OTHER): Payer: Self-pay

## 2020-07-21 DIAGNOSIS — G4733 Obstructive sleep apnea (adult) (pediatric): Secondary | ICD-10-CM

## 2020-07-21 NOTE — Telephone Encounter (Signed)
Dr Erin Fulling, pt is emailing asking for her home sleep study results can you please advise on those results, thanks!

## 2020-07-27 DIAGNOSIS — Z961 Presence of intraocular lens: Secondary | ICD-10-CM | POA: Diagnosis not present

## 2020-07-27 DIAGNOSIS — Z79899 Other long term (current) drug therapy: Secondary | ICD-10-CM | POA: Diagnosis not present

## 2020-07-27 DIAGNOSIS — M0609 Rheumatoid arthritis without rheumatoid factor, multiple sites: Secondary | ICD-10-CM | POA: Diagnosis not present

## 2020-07-27 LAB — HM DIABETES EYE EXAM

## 2020-08-03 NOTE — Telephone Encounter (Signed)
Dr Erin Fulling, please see Dr Janee Morn msg below and advise if you want to order CPAP  Thanks!

## 2020-08-03 NOTE — Telephone Encounter (Signed)
Dr Annamaria Boots, can you please advise if you have read this study? Thanks!

## 2020-08-03 NOTE — Telephone Encounter (Signed)
That home sleep test was read 9/15 and results are in the system under procedures. You have to open "scan order" to access.  There was moderate obstructive apnea

## 2020-08-03 NOTE — Telephone Encounter (Addendum)
-   AHI was 18 apneas/ hour, with drops in blood oxygen saturation, indicating moderate obstructive sleep apnea . I suggest CPAP, but it will have to be ordered by Dr Erin Fulling

## 2020-08-04 ENCOUNTER — Other Ambulatory Visit: Payer: Self-pay | Admitting: *Deleted

## 2020-08-04 NOTE — Telephone Encounter (Signed)
Patient called and was frustrated regarding her sleep study results.  Advised that the sleep study had to be read and after that had been done we would contact her back.  She was very Patent attorney.   Called patient back and provided home sleep test results per Dr. Annamaria Boots.  Advised we will await a response from Dr. Erin Fulling regarding recommendations for treatment.  She verbalized understanding and was very Patent attorney.

## 2020-08-12 NOTE — Progress Notes (Deleted)
Office Visit Note  Patient: Linda Atkinson             Date of Birth: 26-Jan-1946           MRN: [id number]             PCP: Mancel Bale, PA-C Referring: Mancel Bale, PA-C Visit Date: 08/24/2020 Occupation: @GUAROCC @  Subjective:  No chief complaint on file.   History of Present Illness: Linda Atkinson is a 74 y.o. female ***   Activities of Daily Living:  Patient reports morning stiffness for *** {minute/hour:19697}.   Patient {ACTIONS;DENIES/REPORTS:21021675::"Denies"} nocturnal pain.  Difficulty dressing/grooming: {ACTIONS;DENIES/REPORTS:21021675::"Denies"} Difficulty climbing stairs: {ACTIONS;DENIES/REPORTS:21021675::"Denies"} Difficulty getting out of chair: {ACTIONS;DENIES/REPORTS:21021675::"Denies"} Difficulty using hands for taps, buttons, cutlery, and/or writing: {ACTIONS;DENIES/REPORTS:21021675::"Denies"}  No Rheumatology ROS completed.   PMFS History:  Patient Active Problem List   Diagnosis Date Noted  . Morbid obesity ([place]) 07/05/2020  . 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 Rheumatoid arthritis ([place]) 06/18/2017  . Insomnia 08/01/2016  . Diabetes type 2, controlled ([place]) 04/19/2014  . Tobacco abuse 04/18/2014  . Arthralgia 06/29/2013  . Hyperparathyroidism ([place]) 06/11/2013  . Hypothyroidism (acquired) 06/11/2013    Past Medical History:  Diagnosis Date  . Allergy   . Anxiety    about surgery  . Arthritis   . 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 Chest pain   . Complication of anesthesia    Sept. 26 2013: "blood pressure dropped"   . Depression    moderate  . Diabetes ([place])   . GERD (gastroesophageal reflux disease) 1996   hx of  . Hypothyroidism   . IBS (irritable bowel syndrome)   . Joint pain   . Pneumonia 2009   hx of  . Pre-diabetes   . Rheumatoid arthritis ([place])   . Shortness of breath    with walking  . SOB (shortness of breath)   . Thyroid disease   . Vitamin D deficiency     Family History  Problem  Relation Age of Onset  . Heart disease Father   . Diabetes Father   . Hyperlipidemia Father   . Kidney disease Father   . Alzheimer's disease Mother   . High blood pressure Mother   . Thyroid disease Mother   . Cancer Brother        prostate  . Alzheimer's disease Brother   . Cancer Maternal Aunt        breast  . Cancer Paternal Aunt        uterine  . Cancer Paternal Grandmother        colon  . Healthy Son    Past Surgical History:  Procedure Laterality Date  . ABDOMINAL HYSTERECTOMY  1977  . cataract surgery Bilateral 10/2015  . cyst removed Left 1962   knee  . [place] OF UTERUS  1972  . PARATHYROIDECTOMY Right 07/13/2013   Procedure: RIGHT INFERIOR PARATHYROIDECTOMY;  Surgeon: Ralene Ok, MD;  Location: [facility];  Service: General;  Laterality: Right;  . right shoulder rotater cuff Right 2013   x2   Social History   Social History Narrative   Lives alone         Eating mainly raw foods   walks 5 miles  day   Immunization History  Administered Date(s) Administered  . Pneumococcal-Unspecified 11/06/2011  . Td 11/05/2004     Objective: Vital Signs: There were no vitals taken for this visit.   Physical Exam  Musculoskeletal Exam: ***  CDAI Exam: CDAI Score: -- Patient Global: --; Provider Global: -- Swollen: --; Tender: -- Joint Exam 08/24/2020   No joint exam has been documented for this visit   There is currently no information documented on the homunculus. Go to the Rheumatology activity and complete the homunculus joint exam.  Investigation: No additional findings.  Imaging: No results found.  Recent Labs: Lab Results  Component Value Date   WBC 6.1 03/23/2020   HGB 13.7 03/23/2020   PLT 191 03/23/2020   NA 137 03/23/2020   K 4.3 03/23/2020   CL 102 03/23/2020   CO2 29 03/23/2020   GLUCOSE 123 (H) 03/23/2020   BUN 12 03/23/2020   CREATININE 0.71 03/23/2020   BILITOT 1.0 03/23/2020   ALKPHOS 84 05/23/2018   AST 12  03/23/2020   ALT 14 03/23/2020   PROT 6.4 03/23/2020   ALBUMIN 4.5 05/23/2018   CALCIUM 10.7 (H) 03/23/2020   GFRAA 99 03/23/2020    Speciality Comments: PLQ eye exam: 07/27/2020 normal. [facility]. Follow up in 6 months.  Procedures:  No procedures performed Allergies: Lipitor [atorvastatin]   Assessment / Plan:     Visit Diagnoses: Rheumatoid arthritis involving multiple sites with positive rheumatoid factor (HCC)  High risk medication use  Primary osteoarthritis of both hands  Primary osteoarthritis of both feet  Other fatigue  Vitamin D deficiency  Hypothyroidism (acquired)  Hyperparathyroidism ([place])  Other insomnia  Controlled type 2 diabetes mellitus without complication, without long-term current use of insulin (HCC)  Tobacco abuse  Orders: No orders of the defined types were placed in this encounter.  No orders of the defined types were placed in this encounter.   Face-to-face time spent with patient was *** minutes. Greater than 50% of time was spent in counseling and coordination of care.  Follow-Up Instructions: No follow-ups on file.   Ofilia Neas, PA-C  Note - This record has been created using Dragon software.  Chart creation errors have been sought, but may not always  have been located. Such creation errors do not reflect on  the standard of medical care.

## 2020-08-21 DIAGNOSIS — Z20822 Contact with and (suspected) exposure to covid-19: Secondary | ICD-10-CM | POA: Diagnosis not present

## 2020-08-22 ENCOUNTER — Other Ambulatory Visit: Payer: Self-pay | Admitting: Pulmonary Disease

## 2020-08-24 ENCOUNTER — Ambulatory Visit: Payer: PPO | Admitting: Physician Assistant

## 2020-08-24 DIAGNOSIS — Z720 Tobacco use: Secondary | ICD-10-CM

## 2020-08-24 DIAGNOSIS — M19071 Primary osteoarthritis, right ankle and foot: Secondary | ICD-10-CM

## 2020-08-24 DIAGNOSIS — M19041 Primary osteoarthritis, right hand: Secondary | ICD-10-CM

## 2020-08-24 DIAGNOSIS — G4709 Other insomnia: Secondary | ICD-10-CM

## 2020-08-24 DIAGNOSIS — E213 Hyperparathyroidism, unspecified: Secondary | ICD-10-CM

## 2020-08-24 DIAGNOSIS — E039 Hypothyroidism, unspecified: Secondary | ICD-10-CM

## 2020-08-24 DIAGNOSIS — M0579 Rheumatoid arthritis with rheumatoid factor of multiple sites without organ or systems involvement: Secondary | ICD-10-CM

## 2020-08-24 DIAGNOSIS — E119 Type 2 diabetes mellitus without complications: Secondary | ICD-10-CM

## 2020-08-24 DIAGNOSIS — R5383 Other fatigue: Secondary | ICD-10-CM

## 2020-08-24 DIAGNOSIS — E559 Vitamin D deficiency, unspecified: Secondary | ICD-10-CM

## 2020-08-24 DIAGNOSIS — Z79899 Other long term (current) drug therapy: Secondary | ICD-10-CM

## 2020-09-27 DIAGNOSIS — G4733 Obstructive sleep apnea (adult) (pediatric): Secondary | ICD-10-CM | POA: Diagnosis not present

## 2020-10-06 DIAGNOSIS — E039 Hypothyroidism, unspecified: Secondary | ICD-10-CM | POA: Diagnosis not present

## 2020-10-06 DIAGNOSIS — E213 Hyperparathyroidism, unspecified: Secondary | ICD-10-CM | POA: Diagnosis not present

## 2020-10-06 DIAGNOSIS — E119 Type 2 diabetes mellitus without complications: Secondary | ICD-10-CM | POA: Diagnosis not present

## 2020-10-06 DIAGNOSIS — R4589 Other symptoms and signs involving emotional state: Secondary | ICD-10-CM | POA: Diagnosis not present

## 2020-10-06 DIAGNOSIS — E559 Vitamin D deficiency, unspecified: Secondary | ICD-10-CM | POA: Diagnosis not present

## 2020-10-06 DIAGNOSIS — E7800 Pure hypercholesterolemia, unspecified: Secondary | ICD-10-CM | POA: Diagnosis not present

## 2020-10-26 ENCOUNTER — Other Ambulatory Visit: Payer: Self-pay | Admitting: Rheumatology

## 2020-10-26 ENCOUNTER — Other Ambulatory Visit: Payer: Self-pay | Admitting: Pulmonary Disease

## 2020-10-26 DIAGNOSIS — M0579 Rheumatoid arthritis with rheumatoid factor of multiple sites without organ or systems involvement: Secondary | ICD-10-CM

## 2020-10-26 NOTE — Telephone Encounter (Signed)
Dr. Erin Fulling, please advise if you are okay refilling med or if this needs to be deferred to PCP.

## 2020-10-27 DIAGNOSIS — G4733 Obstructive sleep apnea (adult) (pediatric): Secondary | ICD-10-CM | POA: Diagnosis not present

## 2020-11-01 NOTE — Telephone Encounter (Signed)
Pa, Taylor has 2 appointment times available tomorrow 11/02/20 at 8:40 am or 9:20 am.  Does the date or either time work? Jannine Abreu 

## 2020-11-01 NOTE — Telephone Encounter (Signed)
Linda, Atkinson has 2 appointment times available tomorrow 11/02/20 at 8:40 am or 9:20 am.  Does the date or either time work? Linda Atkinson

## 2020-11-02 NOTE — Progress Notes (Deleted)
Office Visit Note  Patient: Linda Atkinson             Date of Birth: August 22, 1946           MRN: [id number]             PCP: Morrell Riddle, PA-C Referring: Morrell Riddle, PA-C Visit Date: 11/10/2020 Occupation: @GUAROCC @  Subjective:  No chief complaint on file.   History of Present Illness: Linda Atkinson is a 74 y.o. female ***   Activities of Daily Living:  Patient reports morning stiffness for *** {minute/hour:19697}.   Patient {ACTIONS;DENIES/REPORTS:21021675::"Denies"} nocturnal pain.  Difficulty dressing/grooming: {ACTIONS;DENIES/REPORTS:21021675::"Denies"} Difficulty climbing stairs: {ACTIONS;DENIES/REPORTS:21021675::"Denies"} Difficulty getting out of chair: {ACTIONS;DENIES/REPORTS:21021675::"Denies"} Difficulty using hands for taps, buttons, cutlery, and/or writing: {ACTIONS;DENIES/REPORTS:21021675::"Denies"}  No Rheumatology ROS completed.   PMFS History:  Patient Active Problem List   Diagnosis Date Noted  . Morbid obesity (HCC) 07/05/2020  . 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 Rheumatoid arthritis (HCC) 06/18/2017  . Insomnia 08/01/2016  . Diabetes type 2, controlled (HCC) 04/19/2014  . Tobacco abuse 04/18/2014  . Arthralgia 06/29/2013  . Hyperparathyroidism (HCC) 06/11/2013  . Hypothyroidism (acquired) 06/11/2013    Past Medical History:  Diagnosis Date  . Allergy   . Anxiety    about surgery  . Arthritis   . 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 Chest pain   . Complication of anesthesia    Sept. 26 2013: "blood pressure dropped"   . Depression    moderate  . Diabetes (HCC)   . GERD (gastroesophageal reflux disease) 1996   hx of  . Hypothyroidism   . IBS (irritable bowel syndrome)   . Joint pain   . Pneumonia 2009   hx of  . Pre-diabetes   . Rheumatoid arthritis (HCC)   . Shortness of breath    with walking  . SOB (shortness of breath)   . Thyroid disease   . Vitamin D deficiency     Family History  Problem  Relation Age of Onset  . Heart disease Father   . Diabetes Father   . Hyperlipidemia Father   . Kidney disease Father   . Alzheimer's disease Mother   . High blood pressure Mother   . Thyroid disease Mother   . Cancer Brother        prostate  . Alzheimer's disease Brother   . Cancer Maternal Aunt        breast  . Cancer Paternal Aunt        uterine  . Cancer Paternal Grandmother        colon  . Healthy Son    Past Surgical History:  Procedure Laterality Date  . ABDOMINAL HYSTERECTOMY  1977  . cataract surgery Bilateral 10/2015  . cyst removed Left 1962   knee  . [place] OF UTERUS  1972  . PARATHYROIDECTOMY Right 07/13/2013   Procedure: RIGHT INFERIOR PARATHYROIDECTOMY;  Surgeon: 09/12/2013, MD;  Location: [facility];  Service: General;  Laterality: Right;  . right shoulder rotater cuff Right 2013   x2   Social History   Social History Narrative   Lives alone         Eating mainly raw foods   walks 5 miles  day   Immunization History  Administered Date(s) Administered  . PFIZER SARS-COV-2 Vaccination 09/13/2020  . Pneumococcal-Unspecified 11/06/2011  . Td 11/05/2004     Objective: Vital Signs: There were no vitals taken for  this visit.   Physical Exam   Musculoskeletal Exam: ***  CDAI Exam: CDAI Score: -- Patient Global: --; Provider Global: -- Swollen: --; Tender: -- Joint Exam 11/10/2020   No joint exam has been documented for this visit   There is currently no information documented on the homunculus. Go to the Rheumatology activity and complete the homunculus joint exam.  Investigation: No additional findings.  Imaging: No results found.  Recent Labs: Lab Results  Component Value Date   WBC 6.1 03/23/2020   HGB 13.7 03/23/2020   PLT 191 03/23/2020   NA 137 03/23/2020   K 4.3 03/23/2020   CL 102 03/23/2020   CO2 29 03/23/2020   GLUCOSE 123 (H) 03/23/2020   BUN 12 03/23/2020   CREATININE 0.71 03/23/2020   BILITOT 1.0  03/23/2020   ALKPHOS 84 05/23/2018   AST 12 03/23/2020   ALT 14 03/23/2020   PROT 6.4 03/23/2020   ALBUMIN 4.5 05/23/2018   CALCIUM 10.7 (H) 03/23/2020   GFRAA 99 03/23/2020    Speciality Comments: PLQ eye exam: 07/27/2020 normal. [facility]. Follow up in 6 months.  Procedures:  No procedures performed Allergies: Lipitor [atorvastatin]   Assessment / Plan:     Visit Diagnoses: No diagnosis found.  Orders: No orders of the defined types were placed in this encounter.  No orders of the defined types were placed in this encounter.   Face-to-face time spent with patient was *** minutes. Greater than 50% of time was spent in counseling and coordination of care.  Follow-Up Instructions: No follow-ups on file.   Earnestine Mealing, CMA  Note - This record has been created using Editor, commissioning.  Chart creation errors have been sought, but may not always  have been located. Such creation errors do not reflect on  the standard of medical care.

## 2020-11-08 NOTE — Telephone Encounter (Signed)
Yes ok to refill 

## 2020-11-10 ENCOUNTER — Ambulatory Visit: Payer: PPO | Admitting: Physician Assistant

## 2020-11-27 DIAGNOSIS — G4733 Obstructive sleep apnea (adult) (pediatric): Secondary | ICD-10-CM | POA: Diagnosis not present

## 2020-12-28 DIAGNOSIS — G4733 Obstructive sleep apnea (adult) (pediatric): Secondary | ICD-10-CM | POA: Diagnosis not present

## 2021-01-13 ENCOUNTER — Other Ambulatory Visit: Payer: Self-pay | Admitting: Pulmonary Disease

## 2021-01-13 NOTE — Telephone Encounter (Signed)
Dr. Erin Fulling, please advise if you are okay with [place] refilling med or if this needs to be deferred to PCP.

## 2021-01-13 NOTE — Telephone Encounter (Signed)
Ok to refill for now for 1 month but after this she will need to have her PCP fill this prescription. She needs to have a follow up with one of our sleep doctors for her sleep apnea.  Thanks, Wille Glaser

## 2021-01-25 DIAGNOSIS — G4733 Obstructive sleep apnea (adult) (pediatric): Secondary | ICD-10-CM | POA: Diagnosis not present

## 2021-02-25 DIAGNOSIS — G4733 Obstructive sleep apnea (adult) (pediatric): Secondary | ICD-10-CM | POA: Diagnosis not present

## 2021-03-26 ENCOUNTER — Other Ambulatory Visit: Payer: Self-pay | Admitting: Pulmonary Disease

## 2021-03-27 DIAGNOSIS — G4733 Obstructive sleep apnea (adult) (pediatric): Secondary | ICD-10-CM | POA: Diagnosis not present

## 2021-04-06 DIAGNOSIS — E213 Hyperparathyroidism, unspecified: Secondary | ICD-10-CM | POA: Diagnosis not present

## 2021-04-06 DIAGNOSIS — Z0000 Encounter for general adult medical examination without abnormal findings: Secondary | ICD-10-CM | POA: Diagnosis not present

## 2021-04-06 DIAGNOSIS — E039 Hypothyroidism, unspecified: Secondary | ICD-10-CM | POA: Diagnosis not present

## 2021-04-06 DIAGNOSIS — R0602 Shortness of breath: Secondary | ICD-10-CM | POA: Diagnosis not present

## 2021-04-06 DIAGNOSIS — Z87891 Personal history of nicotine dependence: Secondary | ICD-10-CM | POA: Diagnosis not present

## 2021-04-06 DIAGNOSIS — E559 Vitamin D deficiency, unspecified: Secondary | ICD-10-CM | POA: Diagnosis not present

## 2021-04-06 DIAGNOSIS — E7800 Pure hypercholesterolemia, unspecified: Secondary | ICD-10-CM | POA: Diagnosis not present

## 2021-04-06 DIAGNOSIS — R12 Heartburn: Secondary | ICD-10-CM | POA: Diagnosis not present

## 2021-04-06 DIAGNOSIS — E119 Type 2 diabetes mellitus without complications: Secondary | ICD-10-CM | POA: Diagnosis not present

## 2021-04-06 DIAGNOSIS — R9431 Abnormal electrocardiogram [ECG] [EKG]: Secondary | ICD-10-CM | POA: Diagnosis not present

## 2021-04-19 ENCOUNTER — Other Ambulatory Visit: Payer: Self-pay

## 2021-04-19 ENCOUNTER — Encounter: Payer: Self-pay | Admitting: Internal Medicine

## 2021-04-19 ENCOUNTER — Ambulatory Visit: Payer: PPO | Admitting: Internal Medicine

## 2021-04-19 VITALS — BP 150/77 | HR 87 | Ht 66.0 in | Wt 263.0 lb

## 2021-04-19 DIAGNOSIS — R9431 Abnormal electrocardiogram [ECG] [EKG]: Secondary | ICD-10-CM

## 2021-04-19 DIAGNOSIS — E119 Type 2 diabetes mellitus without complications: Secondary | ICD-10-CM

## 2021-04-19 DIAGNOSIS — R0600 Dyspnea, unspecified: Secondary | ICD-10-CM | POA: Diagnosis not present

## 2021-04-19 DIAGNOSIS — R0609 Other forms of dyspnea: Secondary | ICD-10-CM

## 2021-04-19 DIAGNOSIS — R0602 Shortness of breath: Secondary | ICD-10-CM

## 2021-04-19 DIAGNOSIS — E213 Hyperparathyroidism, unspecified: Secondary | ICD-10-CM

## 2021-04-19 DIAGNOSIS — M059 Rheumatoid arthritis with rheumatoid factor, unspecified: Secondary | ICD-10-CM

## 2021-04-19 NOTE — Patient Instructions (Signed)
Medication Instructions:  Your Physician recommend you continue on your current medication as directed.    *If you need a refill on your cardiac medications before your next appointment, please call your pharmacy*   Lab Work: None ordered today   Testing/Procedures: Your physician has requested that you have a lexiscan myoview. For further information please visit [email or web address]. Please follow instruction sheet, as given. [place] [address]   Follow-Up: At [facility], you and your health needs are our priority.  As part of our continuing mission to provide you with exceptional heart care, we have created designated Provider Care Teams.  These Care Teams include your primary Cardiologist (physician) and Advanced Practice Providers (APPs -  Physician Assistants and Nurse Practitioners) who all work together to provide you with the care you need, when you need it.  We recommend signing up for the patient portal called "MyChart".  Sign up information is provided on this After Visit Summary.  MyChart is used to connect with patients for Virtual Visits (Telemedicine).  Patients are able to view lab/test results, encounter notes, upcoming appointments, etc.  Non-urgent messages can be sent to your provider as well.   To learn more about what you can do with MyChart, go to [email or web address].    Your next appointment:   After Lexiscan  The format for your next appointment:   In Person  Provider:   Cherlynn Kaiser, MD  You are scheduled for a Myocardial Perfusion Imaging Study.  Please arrive 15 minutes prior to your appointment time for registration and insurance purposes.  The test will take approximately 3 to 4 hours to complete; you may bring reading material.  If someone comes with you to your appointment, they will need to remain in the main lobby due to limited space in the testing area. **If you are pregnant or breastfeeding, please notify the nuclear lab  prior to your appointment**  How to prepare for your Myocardial Perfusion Test: Do not eat or drink 3 hours prior to your test, except you may have water. Do not consume products containing caffeine (regular or decaffeinated) 12 hours prior to your test. (ex: coffee, chocolate, sodas, tea). Do bring a list of your current medications with you.  If not listed below, you may take your medications as normal. Do wear comfortable clothes (no dresses or overalls) and walking shoes, tennis shoes preferred (No heels or open toe shoes are allowed). Do NOT wear cologne, perfume, aftershave, or lotions (deodorant is allowed). If these instructions are not followed, your test will have to be rescheduled.  Please report to [address] for your test.  If you have questions or concerns about your appointment, you can call the Nuclear Lab at [phone].  If you cannot keep your appointment, please provide 24 hours notification to the Nuclear Lab, to avoid a possible $50 charge to your account.

## 2021-04-19 NOTE — Progress Notes (Addendum)
Cardiology Office Note:    Date:  04/19/2021   ID:  Linda Atkinson, DOB Mar 17, 1946, MRN [id number]  PCP:  Linda Bale, PA-C  Cardiologist:  None  Electrophysiologist:  None   Referring MD: Linda Bale, PA-C   Chief Complaint/Reason for Referral: Shortness of breath  History of Present Illness:    Linda Atkinson is a 75 y.o. 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 She was last seen by Linda Hummingbird, PA-C on 04/06/2021 for an annual Medicare wellness visit. [name] was exhibiting shortness of breath. An abnormal EKG showed possible lateral wall ischemia, so an ambulatory referral to cardiology was made.  Today, her primary complaint is she continues to have shortness of breath. Her activity is severely limited, as she is unable to go out and walk short distances.  PCCM first prescribed her 10 mg Lasix daily, with no improvement to her shortness of breath. One month ago she self-increased with to a dose of 20 mg daily. Her PCP recommended a higher dose but she continues with 20 mg daily. Also, she reports that for the last few months she has been having nocturnal heartburn and indigestion. Of note, she has numbness in her left hand when waking up in the morning that she attributes to possible carpal tunnel.  At clinic today her heart rate averages 87 bpm. Typically she does not have high blood pressure at home. She endorses white coat syndrome. At one time she was on Lisinopril, but she does not recall exactly why this was discontinued.  In July 2021 she saw a pulmonologist for her shortness of breath. She was told she likely had sleep apnea and she underwent a sleep study. 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 Currently she has a CPAP, but she feels it has not been helpful and does not use it regularly. She was also referred to  the [facility]. However, the dietary meals were not in line with her goals.  In her 69's she was dx with a mass on her parathyroid. In 2014 she underwent a parathyroidectomy. She has noticed that since then her calcium levels have been elevated.  We discussed her recent Echo results, as well as CT chest that show some coronary calcification. Her PCP told her about a new CT Coronary Angiogram, and she asks about this. After discussion she is amenable to having a stress test due to coronary artery calcifications.  In her family, her father had CABG x5.  She denies chest pain, or palpitations. No headaches, lightheadedness, or syncope to report. Also has no lower extremity edema, orthopnea or PND.   Past Medical History:  Diagnosis Date   Allergy    Anxiety    about surgery   Arthritis    Cancer ([place]) 1972   "carcinoma in situ in pap smear"   Chest pain    Complication of anesthesia    Sept. 26 2013: "blood pressure dropped"    Depression    moderate   Diabetes ([place])    GERD (gastroesophageal reflux disease) 1996   hx of   Hypothyroidism    IBS (irritable bowel syndrome)    Joint pain    Pneumonia 2009   hx of   Pre-diabetes    Rheumatoid arthritis (HCC)    Shortness of breath    with walking  SOB (shortness of breath)    Thyroid disease    Vitamin D deficiency     Past Surgical History:  Procedure Laterality Date   ABDOMINAL HYSTERECTOMY  1977   cataract surgery Bilateral 10/2015   cyst removed Left 1962   knee   DILATION AND CURETTAGE OF UTERUS  1972   PARATHYROIDECTOMY Right 07/13/2013   Procedure: RIGHT INFERIOR PARATHYROIDECTOMY;  Surgeon: Ralene Ok, MD;  Location: [facility];  Service: General;  Laterality: Right;   right shoulder rotater cuff Right 2013   x2    Current Medications: Current Meds  Medication Sig   Alum Hydroxide-Mag Carbonate (GAVISCON PO) Take by mouth.   Cyanocobalamin (VITAMIN B-12) 2500 MCG SUBL Take 1 tablet by mouth  every other day.   furosemide (LASIX) 20 MG tablet TAKE 1/2 TABLET(10 MG) BY MOUTH DAILY   naproxen sodium (ALEVE) 220 MG tablet Take 220 mg by mouth as needed.   RYBELSUS 3 MG TABS Take 1 tablet by mouth every morning.   SYNTHROID 175 MCG tablet Take 175 mcg by mouth daily.   Vitamin D, Ergocalciferol, (DRISDOL) 1.25 MG (50000 UNIT) CAPS capsule Take 50,000 Units by mouth once a week.     Allergies:   Lipitor [atorvastatin]   Social History   Tobacco Use   Smoking status: Former    Packs/day: 0.50    Years: 50.00    Pack years: 25.00    Types: Cigarettes    Quit date: 02/16/2020    Years since quitting: 1.1   Smokeless tobacco: Never  Vaping Use   Vaping Use: Never used  Substance Use Topics   Alcohol use: No    Alcohol/week: 0.0 standard drinks   Drug use: No     Family History: The patient's family history includes Alzheimer's disease in her brother and mother; Cancer in her brother, maternal aunt, paternal aunt, and paternal grandmother; Diabetes in her father; Healthy in her son; Heart disease in her father; High blood pressure in her mother; Hyperlipidemia in her father; Kidney disease in her father; Thyroid disease in her mother.  ROS:   Please see the history of present illness.    (+) Shortness of breath (+) Nocturnal heartburn/indigestion (+) Numbness, Left hand All other systems reviewed and are negative.  EKGs/Labs/Other Studies Reviewed:    The following studies were reviewed today:  Echo 06/20/2020: 1. Normal LV systolic function; moderate LVH with proximal septal  thickening; no LVOT gradient at rest; grade 1 diastolic dysfunction; mild  LAE.   2. Left ventricular ejection fraction, by estimation, is 60 to 65%. The  left ventricle has normal function. The left ventricle has no regional  wall motion abnormalities. There is moderate left ventricular hypertrophy.  Left ventricular diastolic  parameters are consistent with Grade I diastolic dysfunction  (impaired  relaxation). Elevated left atrial pressure.   3. Right ventricular systolic function is normal. The right ventricular  size is normal.   4. Left atrial size was mildly dilated.   5. The mitral valve is normal in structure. No evidence of mitral valve  regurgitation. No evidence of mitral stenosis.   6. The aortic valve has an indeterminant number of cusps. Aortic valve  regurgitation is not visualized. No aortic stenosis is present.   7. The inferior vena cava is normal in size with greater than 50%  respiratory variability, suggesting right atrial pressure of 3 mmHg.   EKG:  04/19/2021: Sinus rhythm. Rate 87 bpm. Possible anterior infarct pattern.   Recent Labs:  06/16/2020: Pro B Natriuretic peptide (BNP) 104.0 07/04/2020: TSH 1.980  Recent Lipid Panel    Component Value Date/Time   CHOL 209 (H) 05/23/2018 1521   TRIG 167 (H) 05/23/2018 1521   HDL 41 05/23/2018 1521   CHOLHDL 5.1 (H) 05/23/2018 1521   CHOLHDL 4.7 08/01/2016 1213   VLDL 32 (H) 08/01/2016 1213   LDLCALC 135 (H) 05/23/2018 1521    Physical Exam:    VS:  BP (!) 150/77   Pulse 87   Ht 5\' 6"  (1.676 m)   Wt 263 lb (119.3 kg)   SpO2 96%   BMI 42.45 kg/m     Wt Readings from Last 5 Encounters:  04/19/21 263 lb (119.3 kg)  07/04/20 264 lb (119.7 kg)  11/24/19 239 lb (108.4 kg)  11/17/19 239 lb (108.4 kg)  06/18/17 200 lb 12.8 oz (91.1 kg)    Constitutional: No acute distress Eyes: sclera non-icteric, normal conjunctiva and lids ENMT: normal dentition, moist mucous membranes Cardiovascular: regular rhythm, normal rate, no murmurs. S1 and S2 normal. Radial pulses normal bilaterally. No jugular venous distention.  Respiratory: clear to auscultation bilaterally GI : normal bowel sounds, soft and nontender. No distention.   MSK: extremities warm, well perfused. No edema.  NEURO: grossly nonfocal exam, moves all extremities. PSYCH: alert and oriented x 3, normal mood and affect.   ASSESSMENT:     1. SOB (shortness of breath)   2. Dyspnea on exertion   3. Abnormal EKG   4. Controlled type 2 diabetes mellitus without complication, without long-term current use of insulin ([place])   5. Hyperparathyroidism ([place])   6. Rheumatoid arthritis with positive rheumatoid factor, involving unspecified site ([place])    PLAN:    SOB (shortness of breath) - Plan: EKG 12-Lead, MYOCARDIAL PERFUSION IMAGING  Abnormal EKG - Plan: EKG 12-Lead, MYOCARDIAL PERFUSION IMAGING  To exclude ischemia in setting of DOE, I would recommend a stress test. She has coronary calcifications on CT chest from 06/17/20, and I have independently reviewed these images as well as shared them with the patient during our office visit. Due to calcifications, I think it would be preferential to start with nuclear stress imaging to avoid the artifacts that may come with CT imaging in setting of coronary calcifications. If nuc stress is equivocal, can always consider CCTA at that time. Test selection discussed in detail with patient and we participated in shared decision making.   Total time of encounter: 45 minutes total time of encounter, including 30 minutes spent in face-to-face patient care on the date of this encounter. This time includes coordination of care and counseling regarding above mentioned problem list. Remainder of non-face-to-face time involved reviewing chart documents/testing relevant to the patient encounter and documentation in the medical record. I have independently reviewed documentation from referring provider.   Cherlynn Kaiser, MD, [place]   Shared Decision Making/Informed Consent:   Shared Decision Making/Informed Consent The risks [chest pain, shortness of breath, cardiac arrhythmias, dizziness, blood pressure fluctuations, myocardial infarction, stroke/transient ischemic attack, nausea, vomiting, allergic reaction, radiation exposure, metallic taste sensation and life-threatening  complications (estimated to be 1 in 10,000)], benefits (risk stratification, diagnosing coronary artery disease, treatment guidance) and alternatives of a nuclear stress test were discussed in detail with Ms. Maeder and she agrees to proceed.   Medication Adjustments/Labs and Tests Ordered: Current medicines are reviewed at length with the patient today.  Concerns regarding medicines are outlined above.   Orders Placed This Encounter  Procedures   MYOCARDIAL PERFUSION IMAGING   EKG 12-Lead     No orders of the defined types were placed in this encounter.   Patient Instructions  Medication Instructions:  Your Physician recommend you continue on your current medication as directed.    *If you need a refill on your cardiac medications before your next appointment, please call your pharmacy*   Lab Work: None ordered today   Testing/Procedures: Your physician has requested that you have a lexiscan myoview. For further information please visit [email or web address]. Please follow instruction sheet, as given. [place] [address]   Follow-Up: At [facility], you and your health needs are our priority.  As part of our continuing mission to provide you with exceptional heart care, we have created designated Provider Care Teams.  These Care Teams include your primary Cardiologist (physician) and Advanced Practice Providers (APPs -  Physician Assistants and Nurse Practitioners) who all work together to provide you with the care you need, when you need it.  We recommend signing up for the patient portal called "MyChart".  Sign up information is provided on this After Visit Summary.  MyChart is used to connect with patients for Virtual Visits (Telemedicine).  Patients are able to view lab/test results, encounter notes, upcoming appointments, etc.  Non-urgent messages can be sent to your provider as well.   To learn more about what you can do with MyChart, go to  [email or web address].    Your next appointment:   After Lexiscan  The format for your next appointment:   In Person  Provider:   Cherlynn Kaiser, MD  You are scheduled for a Myocardial Perfusion Imaging Study.  Please arrive 15 minutes prior to your appointment time for registration and insurance purposes.  The test will take approximately 3 to 4 hours to complete; you may bring reading material.  If someone comes with you to your appointment, they will need to remain in the main lobby due to limited space in the testing area. **If you are pregnant or breastfeeding, please notify the nuclear lab prior to your appointment**  How to prepare for your Myocardial Perfusion Test: Do not eat or drink 3 hours prior to your test, except you may have water. Do not consume products containing caffeine (regular or decaffeinated) 12 hours prior to your test. (ex: coffee, chocolate, sodas, tea). Do bring a list of your current medications with you.  If not listed below, you may take your medications as normal. Do wear comfortable clothes (no dresses or overalls) and walking shoes, tennis shoes preferred (No heels or open toe shoes are allowed). Do NOT wear cologne, perfume, aftershave, or lotions (deodorant is allowed). If these instructions are not followed, your test will have to be rescheduled.  Please report to [address] for your test.  If you have questions or concerns about your appointment, you can call the Nuclear Lab at [phone].  If you cannot keep your appointment, please provide 24 hours notification to the Nuclear Lab, to avoid a possible $50 charge to your account.     I,Mathew Stumpf,acting as a Education administrator for Elouise Munroe, MD.,have documented all relevant documentation on the behalf of Elouise Munroe, MD,as directed by  Elouise Munroe, MD while in the presence of Elouise Munroe, MD.  I, Elouise Munroe, MD, have reviewed all documentation for  this visit. The documentation on 04/19/21 for the exam, diagnosis, procedures, and orders are all accurate and complete.

## 2021-04-27 ENCOUNTER — Ambulatory Visit (HOSPITAL_COMMUNITY): Payer: PPO

## 2021-04-27 DIAGNOSIS — G4733 Obstructive sleep apnea (adult) (pediatric): Secondary | ICD-10-CM | POA: Diagnosis not present

## 2021-04-28 ENCOUNTER — Ambulatory Visit (HOSPITAL_COMMUNITY): Payer: PPO

## 2021-05-02 ENCOUNTER — Ambulatory Visit: Payer: PPO | Admitting: Internal Medicine

## 2021-05-04 ENCOUNTER — Telehealth (HOSPITAL_COMMUNITY): Payer: Self-pay

## 2021-05-04 NOTE — Telephone Encounter (Signed)
Spoke with the patient, detailed instructions given. She stated that she would be here for her test. Asked to call back with any questions. S.William [email or web address]

## 2021-05-09 ENCOUNTER — Other Ambulatory Visit: Payer: Self-pay

## 2021-05-09 ENCOUNTER — Ambulatory Visit (HOSPITAL_COMMUNITY): Payer: PPO

## 2021-05-09 ENCOUNTER — Ambulatory Visit (HOSPITAL_COMMUNITY): Payer: PPO | Attending: Cardiovascular Disease

## 2021-05-09 DIAGNOSIS — R0602 Shortness of breath: Secondary | ICD-10-CM

## 2021-05-09 DIAGNOSIS — R9431 Abnormal electrocardiogram [ECG] [EKG]: Secondary | ICD-10-CM

## 2021-05-09 MED ORDER — TECHNETIUM TC 99M TETROFOSMIN IV KIT
32.0000 | PACK | Freq: Once | INTRAVENOUS | Status: AC | PRN
  Administered 2021-05-09: 32 via INTRAVENOUS
  Filled 2021-05-09: qty 32

## 2021-05-09 MED ORDER — REGADENOSON 0.4 MG/5ML IV SOLN
0.4000 mg | Freq: Once | INTRAVENOUS | Status: AC
Start: 2021-05-09 — End: 2021-05-09
  Administered 2021-05-09: 0.4 mg via INTRAVENOUS

## 2021-05-10 ENCOUNTER — Ambulatory Visit (HOSPITAL_COMMUNITY): Payer: PPO | Attending: Interventional Cardiology

## 2021-05-10 LAB — MYOCARDIAL PERFUSION IMAGING
LV dias vol: 71 mL (ref 46–106)
LV sys vol: 29 mL
Peak HR: 86 {beats}/min
Rest HR: 75 {beats}/min
SDS: 2
SRS: 0
SSS: 2
TID: 1.19

## 2021-05-10 MED ORDER — TECHNETIUM TC 99M TETROFOSMIN IV KIT
29.4000 | PACK | Freq: Once | INTRAVENOUS | Status: AC | PRN
  Administered 2021-05-10: 29.4 via INTRAVENOUS
  Filled 2021-05-10: qty 30

## 2021-05-23 DIAGNOSIS — H6123 Impacted cerumen, bilateral: Secondary | ICD-10-CM | POA: Diagnosis not present

## 2021-05-27 DIAGNOSIS — G4733 Obstructive sleep apnea (adult) (pediatric): Secondary | ICD-10-CM | POA: Diagnosis not present

## 2021-05-31 ENCOUNTER — Other Ambulatory Visit: Payer: Self-pay

## 2021-05-31 ENCOUNTER — Encounter: Payer: Self-pay | Admitting: Internal Medicine

## 2021-05-31 ENCOUNTER — Ambulatory Visit: Payer: PPO | Admitting: Internal Medicine

## 2021-05-31 VITALS — BP 146/84 | HR 94 | Ht 67.0 in

## 2021-05-31 DIAGNOSIS — R0600 Dyspnea, unspecified: Secondary | ICD-10-CM

## 2021-05-31 DIAGNOSIS — M059 Rheumatoid arthritis with rheumatoid factor, unspecified: Secondary | ICD-10-CM

## 2021-05-31 DIAGNOSIS — E119 Type 2 diabetes mellitus without complications: Secondary | ICD-10-CM | POA: Diagnosis not present

## 2021-05-31 DIAGNOSIS — R0602 Shortness of breath: Secondary | ICD-10-CM | POA: Diagnosis not present

## 2021-05-31 DIAGNOSIS — E213 Hyperparathyroidism, unspecified: Secondary | ICD-10-CM | POA: Diagnosis not present

## 2021-05-31 DIAGNOSIS — R9431 Abnormal electrocardiogram [ECG] [EKG]: Secondary | ICD-10-CM | POA: Diagnosis not present

## 2021-05-31 DIAGNOSIS — R0609 Other forms of dyspnea: Secondary | ICD-10-CM

## 2021-05-31 NOTE — Patient Instructions (Signed)
Medication Instructions:  No Changes In Medications at this time.  *If you need a refill on your cardiac medications before your next appointment, please call your pharmacy*  Follow-Up: At [facility], you and your health needs are our priority.  As part of our continuing mission to provide you with exceptional heart care, we have created designated Provider Care Teams.  These Care Teams include your primary Cardiologist (physician) and Advanced Practice Providers (APPs -  Physician Assistants and Nurse Practitioners) who all work together to provide you with the care you need, when you need it.  Your next appointment:   AS NEEDED   The format for your next appointment:   In Person  Provider:   Cherlynn Kaiser, MD

## 2021-05-31 NOTE — Progress Notes (Signed)
Cardiology Office Note:    Date:  05/31/2021   ID:  SCARLET RYKER, DOB [birth year], MRN [id number]  PCP:  Mancel Bale, PA-C  Cardiologist:  None  Electrophysiologist:  None   Referring MD: Mancel Bale, PA-C   Chief Complaint/Reason for Referral: Dyspnea on exertion  History of Present Illness:    Linda Atkinson is a 75 y.o. 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   Her symptoms have not significantly changed since our visit in June.  She notes that even just walking down the hallway where she lives she will be short of breath.  We reviewed her cardiovascular testing which included echocardiogram performed prior to our initial consultation as well as nuclear stress test.  Nuclear stress test demonstrated a low risk study with an ejection fraction of 60%.  I have shared the nuclear images with the patient in the office today.  There is a mild fixed defect in the inferior wall attributed to artifact likely secondary to body habitus, otherwise normal perfusion.   We reviewed options for further testing.  With a normal nuclear stress test, coronary CTA would not provide much interval benefit, though we can consider this if symptoms worsen.  However we did discuss that if symptoms progress, I would consider coronary angiography at that point.  She is not inclined to have this done if not needed, we will revisit only if symptoms continue or worsen.  Recall from her initial consultation that Lasix was not significantly helpful.  With moderate LVH on echo we may also want to consider exercise hypertension as a source of her symptoms.  She may require further blood pressure control going forward.  We discussed all options for next steps including revisiting pulmonary medicine, repeating echocardiogram, or continuing to follow with her primary care provider.  She  would like to continue following with her primary provider Windell Hummingbird, and I think this is very reasonable given her reassuring cardiovascular testing.  The patient denies chest pain, chest pressure, palpitations, PND, orthopnea, or leg swelling. Denies cough, fever, chills. Denies nausea, vomiting. Denies syncope or presyncope. Denies dizziness or lightheadedness.    Past Medical History:  Diagnosis Date   Allergy    Anxiety    about surgery   Arthritis    Cancer ([place]) 1972   "carcinoma in situ in pap smear"   Chest pain    Complication of anesthesia    Sept. 26 2013: "blood pressure dropped"    Depression    moderate   Diabetes ([place])    GERD (gastroesophageal reflux disease) 1996   hx of   Hypothyroidism    IBS (irritable bowel syndrome)    Joint pain    Pneumonia 2009   hx of   Pre-diabetes    Rheumatoid arthritis ([place])    Shortness of breath    with walking   SOB (shortness of breath)    Thyroid disease    Vitamin D deficiency     Past Surgical History:  Procedure Laterality Date   ABDOMINAL HYSTERECTOMY  1977   cataract surgery Bilateral 10/2015   cyst removed Left 1962   knee   DILATION AND CURETTAGE OF UTERUS  1972   PARATHYROIDECTOMY Right 07/13/2013   Procedure: RIGHT INFERIOR PARATHYROIDECTOMY;  Surgeon: Ralene Ok, MD;  Location: [facility];  Service: General;  Laterality: Right;   right shoulder  rotater cuff Right 2013   x2    Current Medications: Current Meds  Medication Sig   Cyanocobalamin (VITAMIN B-12) 2500 MCG SUBL Take 1 tablet by mouth every other day.   famotidine (PEPCID) 20 MG tablet    furosemide (LASIX) 20 MG tablet TAKE 1/2 TABLET(10 MG) BY MOUTH DAILY   naproxen sodium (ALEVE) 220 MG tablet Take 220 mg by mouth as needed.   RYBELSUS 3 MG TABS Take 1 tablet by mouth every morning.   SYNTHROID 175 MCG tablet Take 175 mcg by mouth daily.   Vitamin D, Ergocalciferol, (DRISDOL) 1.25 MG (50000 UNIT) CAPS capsule Take 50,000 Units by mouth  once a week.     Allergies:   Lipitor [atorvastatin]   Social History   Tobacco Use   Smoking status: Former    Packs/day: 0.50    Years: 50.00    Pack years: 25.00    Types: Cigarettes    Quit date: 02/16/2020    Years since quitting: 1.2   Smokeless tobacco: Never  Vaping Use   Vaping Use: Never used  Substance Use Topics   Alcohol use: No    Alcohol/week: 0.0 standard drinks   Drug use: No     Family History: The patient's family history includes Alzheimer's disease in her brother and mother; Cancer in her brother, maternal aunt, paternal aunt, and paternal grandmother; Diabetes in her father; Healthy in her son; Heart disease in her father; High blood pressure in her mother; Hyperlipidemia in her father; Kidney disease in her father; Thyroid disease in her mother.  ROS:   Please see the history of present illness.    All other systems reviewed and are negative.  EKGs/Labs/Other Studies Reviewed:    The following studies were reviewed today:  EKG:  n/a  I have independently reviewed the images from nuclear stress test 05/10/21.  Recent Labs: 06/16/2020: Pro B Natriuretic peptide (BNP) 104.0 07/04/2020: TSH 1.980  Recent Lipid Panel    Component Value Date/Time   CHOL 209 (H) 05/23/2018 1521   TRIG 167 (H) 05/23/2018 1521   HDL 41 05/23/2018 1521   CHOLHDL 5.1 (H) 05/23/2018 1521   CHOLHDL 4.7 08/01/2016 1213   VLDL 32 (H) 08/01/2016 1213   LDLCALC 135 (H) 05/23/2018 1521    Physical Exam:    VS:  BP (!) 146/84   Pulse 94   Ht '5\' 7"'$  (1.702 m)   BMI 41.19 kg/m     Wt Readings from Last 5 Encounters:  05/09/21 263 lb (119.3 kg)  04/19/21 263 lb (119.3 kg)  07/04/20 264 lb (119.7 kg)  11/24/19 239 lb (108.4 kg)  11/17/19 239 lb (108.4 kg)    Constitutional: No acute distress Eyes: sclera non-icteric, normal conjunctiva and lids ENMT: normal dentition, moist mucous membranes Cardiovascular: regular rhythm, normal rate, no murmurs. S1 and S2 normal.  Radial pulses normal bilaterally. No jugular venous distention.  Respiratory: clear to auscultation bilaterally GI : normal bowel sounds, soft and nontender. No distention.   MSK: extremities warm, well perfused. No edema.  NEURO: grossly nonfocal exam, moves all extremities. PSYCH: alert and oriented x 3, normal mood and affect.   ASSESSMENT:    1. Dyspnea on exertion   2. SOB (shortness of breath)   3. Abnormal EKG   4. Controlled type 2 diabetes mellitus without complication, without long-term current use of insulin ([place])   5. Hyperparathyroidism ([place])   6. Rheumatoid arthritis with positive rheumatoid factor, involving unspecified site ([place])  7. Morbid obesity ([place])    PLAN:    Dyspnea on exertion SOB (shortness of breath) Abnormal EKG Morbid obesity ([facility])  -We reviewed results from her nuclear stress test which showed no ischemia.  That coupled with her echocardiogram from last year suggest no significant cardiovascular disease as etiology of her dyspnea on exertion.  Cannot exclude exercise hypertension and may want to consider titration of antihypertensive therapy empirically if no other source of shortness of breath can be identified.  Patient did not find Lasix to be particularly helpful.  If shortness of breath continues or worsens we will repeat echocardiogram and schedule cardiology visit at that time.  I have asked her to consider revisiting with pulmonary medicine for any symptomatic treatments they may be able to recommend.  Return to clinic as needed.  Total time of encounter: 20 minutes total time of encounter, including 16 minutes spent in face-to-face patient care on the date of this encounter. This time includes coordination of care and counseling regarding above mentioned problem list. Remainder of non-face-to-face time involved reviewing chart documents/testing relevant to the patient encounter and documentation in the medical record. I have independently  reviewed documentation from referring provider.   Cherlynn Kaiser, MD, [facility]   Medication Adjustments/Labs and Tests Ordered: Current medicines are reviewed at length with the patient today.  Concerns regarding medicines are outlined above.   No orders of the defined types were placed in this encounter.   No orders of the defined types were placed in this encounter.   Patient Instructions  Medication Instructions:  No Changes In Medications at this time.  *If you need a refill on your cardiac medications before your next appointment, please call your pharmacy*  Follow-Up: At [facility], you and your health needs are our priority.  As part of our continuing mission to provide you with exceptional heart care, we have created designated Provider Care Teams.  These Care Teams include your primary Cardiologist (physician) and Advanced Practice Providers (APPs -  Physician Assistants and Nurse Practitioners) who all work together to provide you with the care you need, when you need it.  Your next appointment:   AS NEEDED   The format for your next appointment:   In Person  Provider:   Cherlynn Kaiser, MD

## 2021-06-27 DIAGNOSIS — G4733 Obstructive sleep apnea (adult) (pediatric): Secondary | ICD-10-CM | POA: Diagnosis not present

## 2021-07-07 DIAGNOSIS — E039 Hypothyroidism, unspecified: Secondary | ICD-10-CM | POA: Diagnosis not present

## 2021-07-07 DIAGNOSIS — Z13228 Encounter for screening for other metabolic disorders: Secondary | ICD-10-CM | POA: Diagnosis not present

## 2021-07-07 DIAGNOSIS — E119 Type 2 diabetes mellitus without complications: Secondary | ICD-10-CM | POA: Diagnosis not present

## 2021-07-07 DIAGNOSIS — M069 Rheumatoid arthritis, unspecified: Secondary | ICD-10-CM | POA: Diagnosis not present

## 2021-07-07 DIAGNOSIS — E213 Hyperparathyroidism, unspecified: Secondary | ICD-10-CM | POA: Diagnosis not present

## 2021-07-07 DIAGNOSIS — Z130 Encounter for screening for diseases of the blood and blood-forming organs and certain disorders involving the immune mechanism: Secondary | ICD-10-CM | POA: Diagnosis not present

## 2021-07-07 DIAGNOSIS — E559 Vitamin D deficiency, unspecified: Secondary | ICD-10-CM | POA: Diagnosis not present

## 2021-07-28 DIAGNOSIS — G4733 Obstructive sleep apnea (adult) (pediatric): Secondary | ICD-10-CM | POA: Diagnosis not present

## 2021-08-27 DIAGNOSIS — G4733 Obstructive sleep apnea (adult) (pediatric): Secondary | ICD-10-CM | POA: Diagnosis not present

## 2021-08-27 IMAGING — CT CT SHOULDER*R* W/O CM
2 of 3 series · 13 of 27 positions shown, 16 images · non-contrast
Comparison: Right shoulder x-rays dated November 17, 2019. MRI right
shoulder dated July 10, 2012.

CLINICAL DATA: Chronic right shoulder pain.

EXAM:
CT OF THE UPPER RIGHT EXTREMITY WITHOUT CONTRAST
TECHNIQUE: Multidetector CT imaging of the right shoulder was performed
according to the standard protocol.

[Series 10: shoulder 2.00 br40 s3 sag soft · sagittal · 0.37mm/px · 5 of 134 slices shown, 6 images]
[im 45/134  bone]
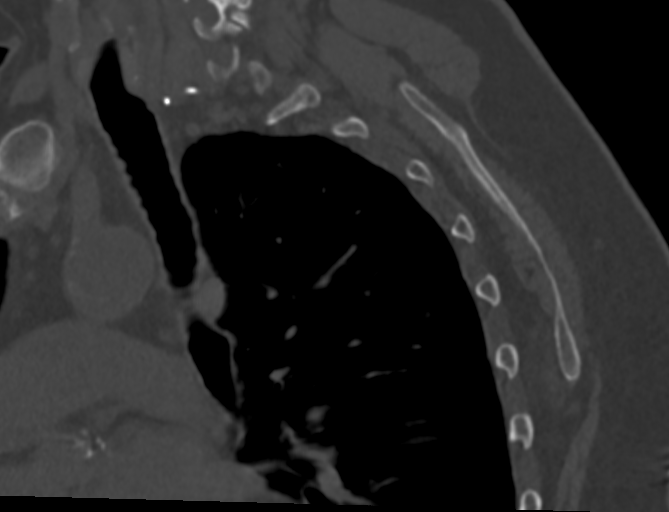
[im 56/134  bone]
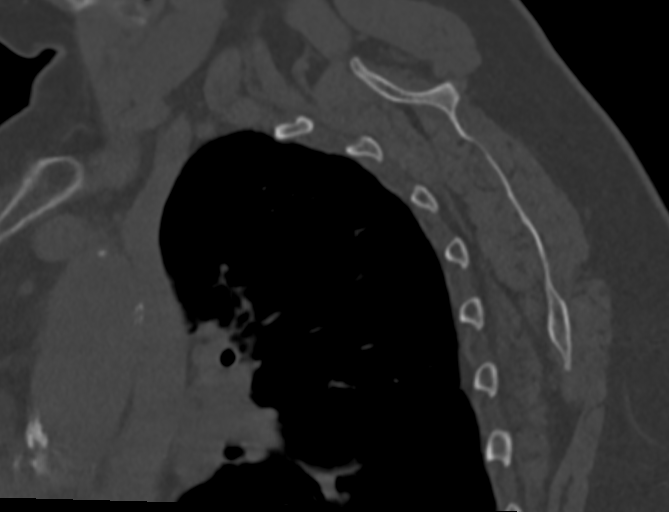
[im 67/134  soft-tissue]
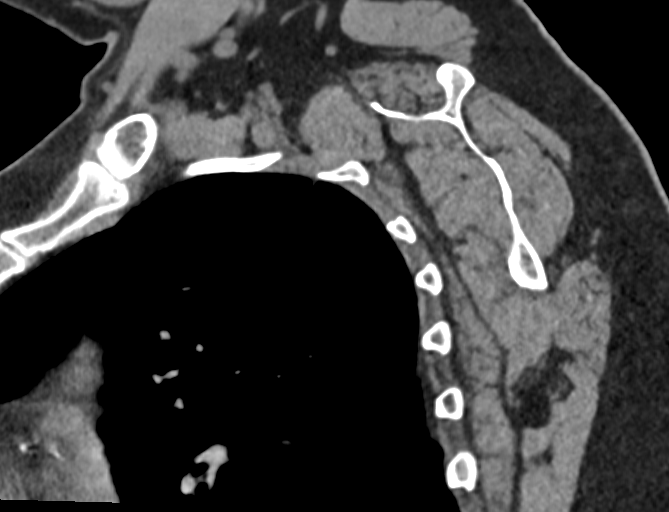
[im 67/134  bone]
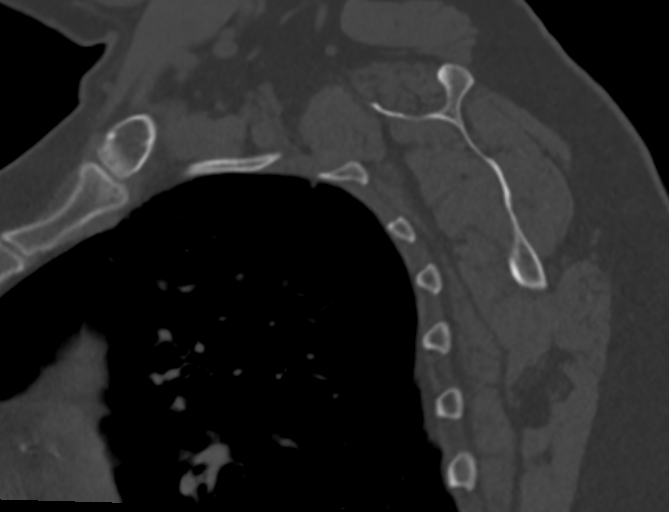
[im 78/134  bone]
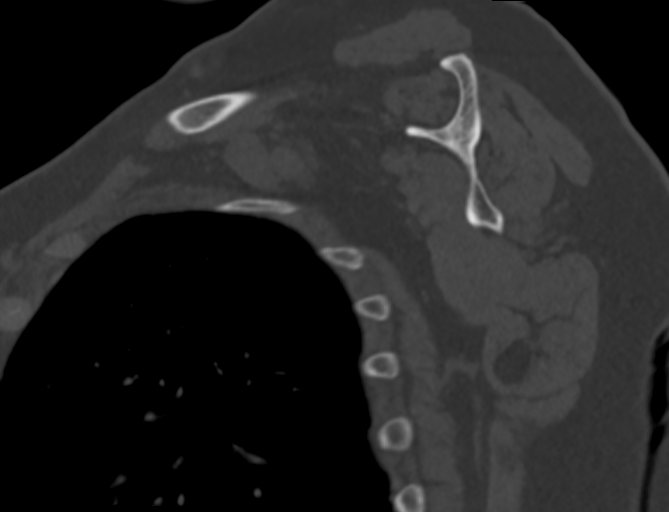
[im 89/134  bone]
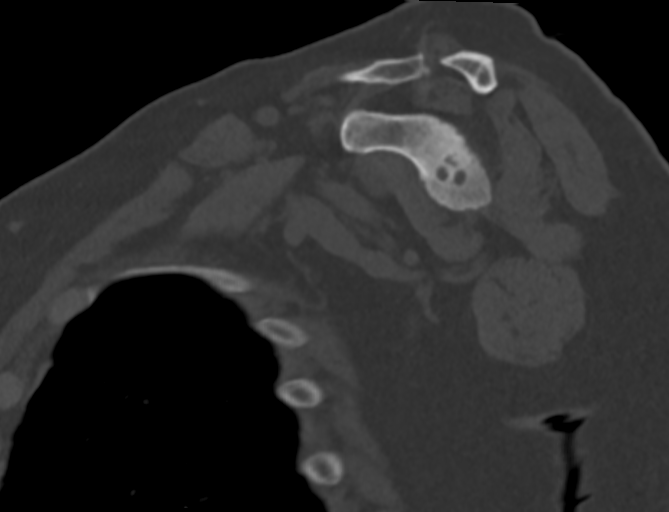

[Series 14: shoulder 0.60 br40 s3 thins soft · axial · 0.47mm/px · z∈[-909,-759]mm · 8 of 313 slices shown, 10 images]
[im 32/313  soft-tissue]
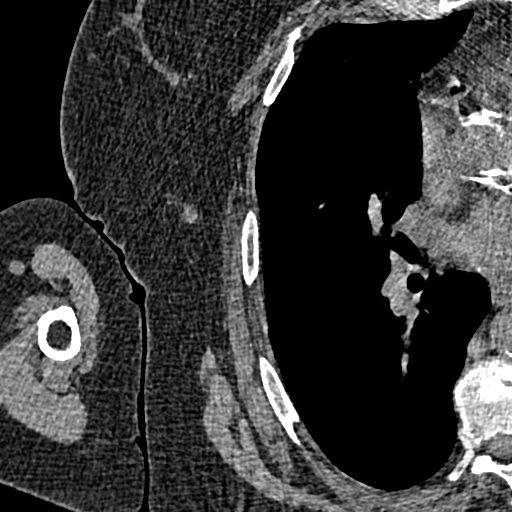
[im 32/313  bone]
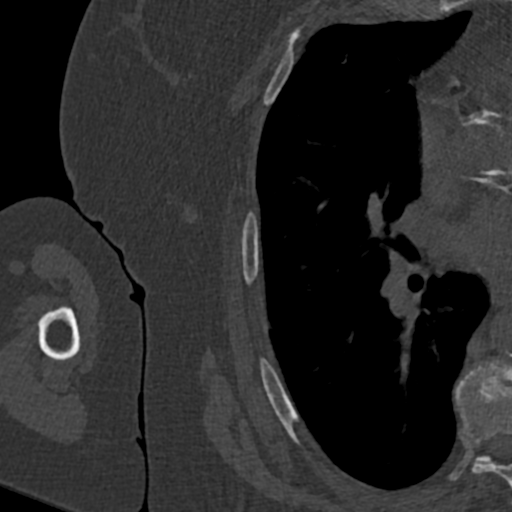
[im 63/313  bone]
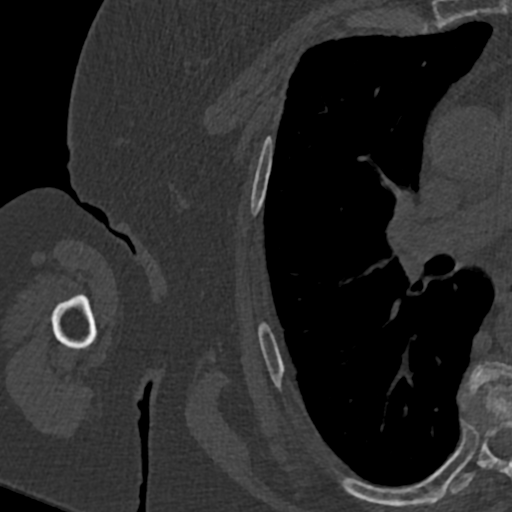
[im 94/313  bone]
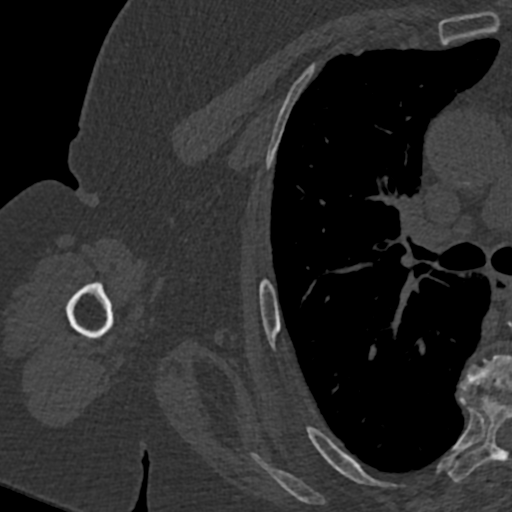
[im 125/313  bone]
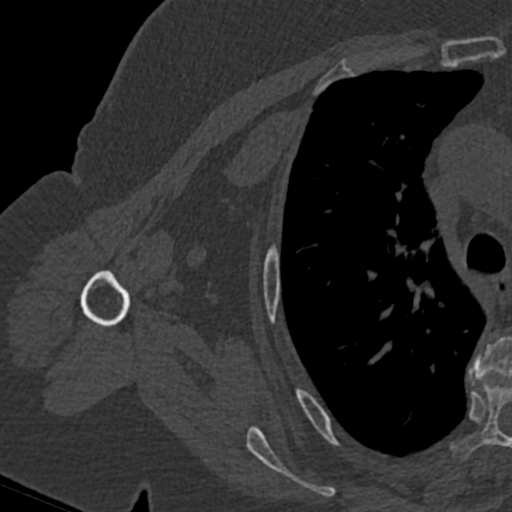
[im 188/313  soft-tissue]
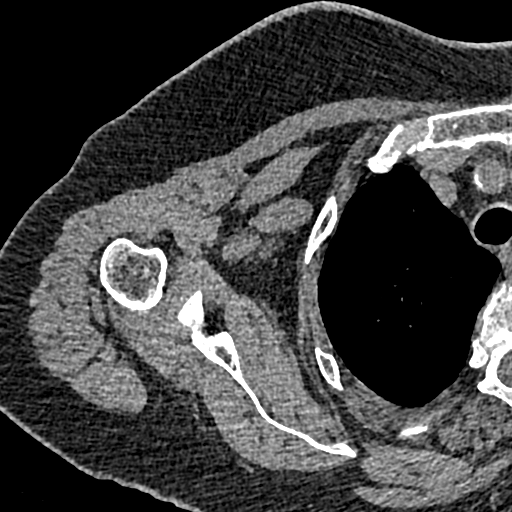
[im 188/313  bone]
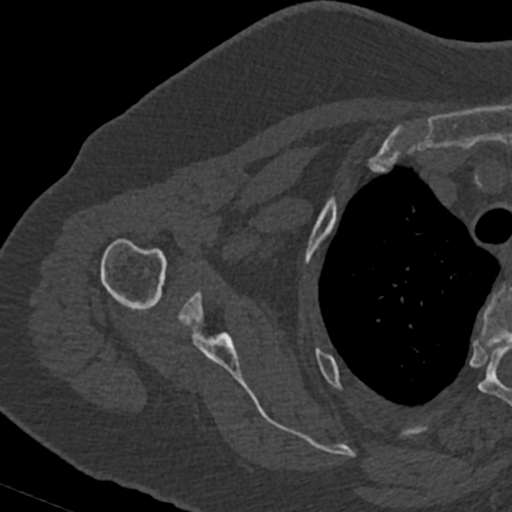
[im 219/313  bone]
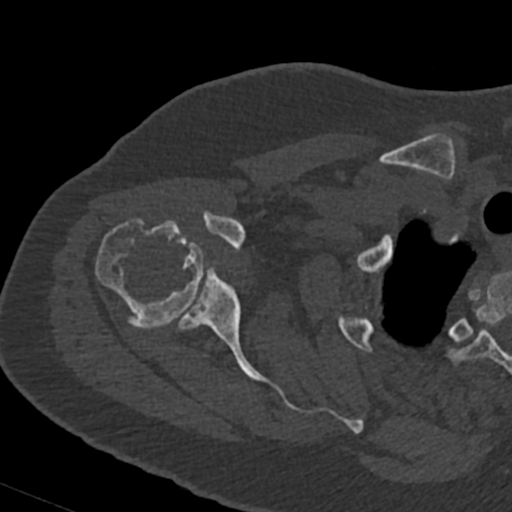
[im 250/313  bone]
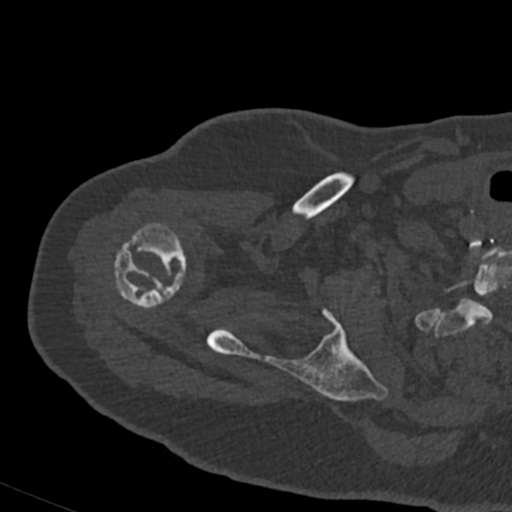
[im 281/313  bone]
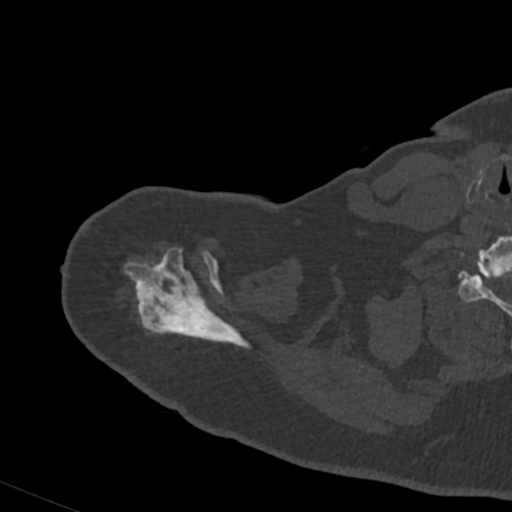

[13 of 27 positions shown; findings below may reference images not displayed]

FINDINGS: Bones/Joint/Cartilage

No fracture or dislocation. Normal alignment. Small glenohumeral
joint effusion.

Severe osteoarthritis of the glenohumeral joint with severe joint
space narrowing, bone-on-bone appearance, subchondral sclerosis,
subchondral cystic changes and marginal osteophytosis. Large geode
in the humeral head. Small intra-articular bodies anterior to the
subscapularis tendon and within the bicipital groove.

Prior distal clavicle resection and acromioplasty.

Ligaments

Ligaments are suboptimally evaluated by CT.

Muscles and Tendons
The rotator cuff tendons are not well evaluated by CT. Suture anchor
in the anterior greater tuberosity from prior rotator cuff repair.
Moderate atrophy of the teres minor muscle. Mild fatty infiltration
of the remaining rotator cuff muscles. 4.5 x 1.8 x 3.4 cm lipoma
within the teres major muscle.

Soft tissue
No fluid collection or hematoma. There are a few 2-3 mm pulmonary
nodules in the right upper lobe.
IMPRESSION: 1. Severe glenohumeral osteoarthritis with small intra-articular
bodies.
2. 4.5 cm lipoma within the teres major muscle.
3. Few 2-3 mm pulmonary nodules in the right upper lobe. No
follow-up needed if patient is low-risk (and has no known or
suspected primary neoplasm). Non-contrast chest CT can be considered
in 12 months if patient is high-risk. This recommendation follows
the consensus statement: Guidelines for Management of Incidental
Pulmonary Nodules Detected on CT Images: From the [HOSPITAL]

## 2021-10-19 ENCOUNTER — Inpatient Hospital Stay (HOSPITAL_BASED_OUTPATIENT_CLINIC_OR_DEPARTMENT_OTHER)
Admission: EM | Admit: 2021-10-19 | Discharge: 2021-10-25 | DRG: 439 | Disposition: A | Discharge status: Home / Self Care | Payer: PPO | Attending: Internal Medicine | Admitting: Internal Medicine

## 2021-10-19 ENCOUNTER — Inpatient Hospital Stay (HOSPITAL_COMMUNITY): Payer: PPO

## 2021-10-19 ENCOUNTER — Emergency Department (HOSPITAL_BASED_OUTPATIENT_CLINIC_OR_DEPARTMENT_OTHER): Payer: PPO

## 2021-10-19 ENCOUNTER — Other Ambulatory Visit: Payer: Self-pay

## 2021-10-19 ENCOUNTER — Encounter (HOSPITAL_BASED_OUTPATIENT_CLINIC_OR_DEPARTMENT_OTHER): Payer: Self-pay

## 2021-10-19 DIAGNOSIS — R079 Chest pain, unspecified: Secondary | ICD-10-CM | POA: Diagnosis not present

## 2021-10-19 DIAGNOSIS — K219 Gastro-esophageal reflux disease without esophagitis: Secondary | ICD-10-CM | POA: Diagnosis present

## 2021-10-19 DIAGNOSIS — K649 Unspecified hemorrhoids: Secondary | ICD-10-CM | POA: Diagnosis present

## 2021-10-19 DIAGNOSIS — M059 Rheumatoid arthritis with rheumatoid factor, unspecified: Secondary | ICD-10-CM | POA: Diagnosis not present

## 2021-10-19 DIAGNOSIS — I5032 Chronic diastolic (congestive) heart failure: Secondary | ICD-10-CM | POA: Diagnosis present

## 2021-10-19 DIAGNOSIS — Z8701 Personal history of pneumonia (recurrent): Secondary | ICD-10-CM | POA: Diagnosis not present

## 2021-10-19 DIAGNOSIS — K760 Fatty (change of) liver, not elsewhere classified: Secondary | ICD-10-CM | POA: Diagnosis present

## 2021-10-19 DIAGNOSIS — M19019 Primary osteoarthritis, unspecified shoulder: Secondary | ICD-10-CM | POA: Diagnosis present

## 2021-10-19 DIAGNOSIS — Z833 Family history of diabetes mellitus: Secondary | ICD-10-CM

## 2021-10-19 DIAGNOSIS — Z6841 Body Mass Index (BMI) 40.0 and over, adult: Secondary | ICD-10-CM

## 2021-10-19 DIAGNOSIS — E559 Vitamin D deficiency, unspecified: Secondary | ICD-10-CM | POA: Diagnosis present

## 2021-10-19 DIAGNOSIS — Z7984 Long term (current) use of oral hypoglycemic drugs: Secondary | ICD-10-CM

## 2021-10-19 DIAGNOSIS — R0602 Shortness of breath: Secondary | ICD-10-CM

## 2021-10-19 DIAGNOSIS — Z87891 Personal history of nicotine dependence: Secondary | ICD-10-CM

## 2021-10-19 DIAGNOSIS — F419 Anxiety disorder, unspecified: Secondary | ICD-10-CM | POA: Diagnosis present

## 2021-10-19 DIAGNOSIS — Z79890 Hormone replacement therapy (postmenopausal): Secondary | ICD-10-CM | POA: Diagnosis not present

## 2021-10-19 DIAGNOSIS — R0789 Other chest pain: Secondary | ICD-10-CM | POA: Diagnosis not present

## 2021-10-19 DIAGNOSIS — M1990 Unspecified osteoarthritis, unspecified site: Secondary | ICD-10-CM | POA: Diagnosis present

## 2021-10-19 DIAGNOSIS — Z20822 Contact with and (suspected) exposure to covid-19: Secondary | ICD-10-CM | POA: Diagnosis present

## 2021-10-19 DIAGNOSIS — E119 Type 2 diabetes mellitus without complications: Secondary | ICD-10-CM | POA: Diagnosis present

## 2021-10-19 DIAGNOSIS — R0600 Dyspnea, unspecified: Secondary | ICD-10-CM | POA: Diagnosis not present

## 2021-10-19 DIAGNOSIS — E039 Hypothyroidism, unspecified: Secondary | ICD-10-CM | POA: Diagnosis present

## 2021-10-19 DIAGNOSIS — I110 Hypertensive heart disease with heart failure: Secondary | ICD-10-CM | POA: Diagnosis present

## 2021-10-19 DIAGNOSIS — E892 Postprocedural hypoparathyroidism: Secondary | ICD-10-CM | POA: Diagnosis present

## 2021-10-19 DIAGNOSIS — K859 Acute pancreatitis without necrosis or infection, unspecified: Secondary | ICD-10-CM | POA: Diagnosis not present

## 2021-10-19 DIAGNOSIS — J9811 Atelectasis: Secondary | ICD-10-CM | POA: Diagnosis not present

## 2021-10-19 DIAGNOSIS — Z90710 Acquired absence of both cervix and uterus: Secondary | ICD-10-CM | POA: Diagnosis not present

## 2021-10-19 DIAGNOSIS — E876 Hypokalemia: Secondary | ICD-10-CM | POA: Diagnosis present

## 2021-10-19 DIAGNOSIS — K7689 Other specified diseases of liver: Secondary | ICD-10-CM | POA: Diagnosis not present

## 2021-10-19 DIAGNOSIS — Z79899 Other long term (current) drug therapy: Secondary | ICD-10-CM

## 2021-10-19 DIAGNOSIS — K8500 Idiopathic acute pancreatitis without necrosis or infection: Secondary | ICD-10-CM | POA: Diagnosis not present

## 2021-10-19 DIAGNOSIS — Z8349 Family history of other endocrine, nutritional and metabolic diseases: Secondary | ICD-10-CM

## 2021-10-19 DIAGNOSIS — M069 Rheumatoid arthritis, unspecified: Secondary | ICD-10-CM | POA: Diagnosis present

## 2021-10-19 DIAGNOSIS — Z8249 Family history of ischemic heart disease and other diseases of the circulatory system: Secondary | ICD-10-CM

## 2021-10-19 LAB — GLUCOSE, CAPILLARY: Glucose-Capillary: 248 mg/dL — ABNORMAL HIGH (ref 70–99)

## 2021-10-19 LAB — COMPREHENSIVE METABOLIC PANEL
ALT: 25 U/L (ref 0–44)
AST: 19 U/L (ref 15–41)
Albumin: 4 g/dL (ref 3.5–5.0)
Alkaline Phosphatase: 96 U/L (ref 38–126)
Anion gap: 13 (ref 5–15)
BUN: 14 mg/dL (ref 8–23)
CO2: 22 mmol/L (ref 22–32)
Calcium: 10.3 mg/dL (ref 8.9–10.3)
Chloride: 97 mmol/L — ABNORMAL LOW (ref 98–111)
Creatinine, Ser: 0.76 mg/dL (ref 0.44–1.00)
GFR, Estimated: >60 mL/min (ref >60)
Glucose, Bld: 315 mg/dL — ABNORMAL HIGH (ref 70–99)
Potassium: 3.5 mmol/L (ref 3.5–5.1)
Sodium: 132 mmol/L — ABNORMAL LOW (ref 135–145)
Total Bilirubin: 1 mg/dL (ref 0.3–1.2)
Total Protein: 6.7 g/dL (ref 6.5–8.1)

## 2021-10-19 LAB — URINALYSIS, ROUTINE W REFLEX MICROSCOPIC
Bilirubin Urine: NEGATIVE
Glucose, UA: >=500 mg/dL — AB
Hgb urine dipstick: NEGATIVE
Ketones, ur: >=80 mg/dL — AB
Leukocytes,Ua: NEGATIVE
Nitrite: NEGATIVE
Protein, ur: NEGATIVE mg/dL
Specific Gravity, Urine: 1.02 (ref 1.005–1.030)
pH: 5 (ref 5.0–8.0)

## 2021-10-19 LAB — CBC WITH DIFFERENTIAL/PLATELET
Abs Immature Granulocytes: 0.03 10*3/uL (ref 0.00–0.07)
Basophils Absolute: 0 10*3/uL (ref 0.0–0.1)
Basophils Relative: 0 %
Eosinophils Absolute: 0 10*3/uL (ref 0.0–0.5)
Eosinophils Relative: 0 %
HCT: 39.2 % (ref 36.0–46.0)
Hemoglobin: 13.3 g/dL (ref 12.0–15.0)
Immature Granulocytes: 0 %
Lymphocytes Relative: 9 %
Lymphs Abs: 0.9 10*3/uL (ref 0.7–4.0)
MCH: 28.4 pg (ref 26.0–34.0)
MCHC: 33.9 g/dL (ref 30.0–36.0)
MCV: 83.6 fL (ref 80.0–100.0)
Monocytes Absolute: 0.4 10*3/uL (ref 0.1–1.0)
Monocytes Relative: 4 %
Neutro Abs: 8.5 10*3/uL — ABNORMAL HIGH (ref 1.7–7.7)
Neutrophils Relative %: 87 %
Platelets: 211 10*3/uL (ref 150–400)
RBC: 4.69 MIL/uL (ref 3.87–5.11)
RDW: 13.9 % (ref 11.5–15.5)
WBC: 9.8 10*3/uL (ref 4.0–10.5)
nRBC: 0 % (ref 0.0–0.2)

## 2021-10-19 LAB — URINALYSIS, MICROSCOPIC (REFLEX)

## 2021-10-19 LAB — TROPONIN I (HIGH SENSITIVITY)
Troponin I (High Sensitivity): 6 ng/L (ref <18)
Troponin I (High Sensitivity): 6 ng/L (ref <18)

## 2021-10-19 LAB — RESP PANEL BY RT-PCR (FLU A&B, COVID) ARPGX2
Influenza A by PCR: NEGATIVE
Influenza B by PCR: NEGATIVE
SARS Coronavirus 2 by RT PCR: NEGATIVE

## 2021-10-19 LAB — CBG MONITORING, ED: Glucose-Capillary: 246 mg/dL — ABNORMAL HIGH (ref 70–99)

## 2021-10-19 LAB — HEMOGLOBIN A1C
Hgb A1c MFr Bld: 9.6 % — ABNORMAL HIGH (ref 4.8–5.6)
Mean Plasma Glucose: 228.82 mg/dL

## 2021-10-19 LAB — LACTIC ACID, PLASMA
Lactic Acid, Venous: 2.3 mmol/L (ref 0.5–1.9)
Lactic Acid, Venous: 2 mmol/L (ref 0.5–1.9)

## 2021-10-19 LAB — TRIGLYCERIDES: Triglycerides: 101 mg/dL (ref <150)

## 2021-10-19 LAB — LIPASE, BLOOD: Lipase: 1277 U/L — ABNORMAL HIGH (ref 11–51)

## 2021-10-19 MED ORDER — SODIUM CHLORIDE 0.9 % IV BOLUS
500.0000 mL | Freq: Once | INTRAVENOUS | Status: AC
  Administered 2021-10-19: 500 mL via INTRAVENOUS

## 2021-10-19 MED ORDER — INSULIN ASPART 100 UNIT/ML IJ SOLN
0.0000 [IU] | Freq: Three times a day (TID) | INTRAMUSCULAR | Status: DC
  Administered 2021-10-19 – 2021-10-20 (×5): 5 [IU] via SUBCUTANEOUS
  Administered 2021-10-21 – 2021-10-22 (×7): 3 [IU] via SUBCUTANEOUS
  Administered 2021-10-22: 17:00:00 2 [IU] via SUBCUTANEOUS
  Administered 2021-10-23 – 2021-10-25 (×9): 3 [IU] via SUBCUTANEOUS

## 2021-10-19 MED ORDER — HYDROMORPHONE HCL 1 MG/ML IJ SOLN
1.0000 mg | INTRAMUSCULAR | Status: AC | PRN
  Administered 2021-10-19 – 2021-10-20 (×4): 1 mg via INTRAVENOUS
  Filled 2021-10-19 (×4): qty 1

## 2021-10-19 MED ORDER — ENOXAPARIN SODIUM 60 MG/0.6ML IJ SOSY
50.0000 mg | PREFILLED_SYRINGE | INTRAMUSCULAR | Status: DC
  Administered 2021-10-19 – 2021-10-20 (×2): 50 mg via SUBCUTANEOUS
  Filled 2021-10-19 (×2): qty 0.6

## 2021-10-19 MED ORDER — IOHEXOL 350 MG/ML SOLN
125.0000 mL | Freq: Once | INTRAVENOUS | Status: AC | PRN
  Administered 2021-10-19: 125 mL via INTRAVENOUS

## 2021-10-19 MED ORDER — ONDANSETRON HCL 4 MG/2ML IJ SOLN
4.0000 mg | Freq: Once | INTRAMUSCULAR | Status: AC
  Administered 2021-10-19: 4 mg via INTRAVENOUS
  Filled 2021-10-19: qty 2

## 2021-10-19 MED ORDER — SEMAGLUTIDE 3 MG PO TABS: 1.0000 | ORAL_TABLET | Freq: Every morning | ORAL | Status: DC

## 2021-10-19 MED ORDER — SODIUM CHLORIDE 0.9 % IV SOLN: Freq: Once | INTRAVENOUS | Status: AC

## 2021-10-19 MED ORDER — EMPAGLIFLOZIN 10 MG PO TABS
10.0000 mg | ORAL_TABLET | Freq: Every day | ORAL | Status: DC
  Filled 2021-10-19: qty 1

## 2021-10-19 MED ORDER — ONDANSETRON HCL 4 MG/2ML IJ SOLN: 4.0000 mg | Freq: Four times a day (QID) | INTRAMUSCULAR | Status: DC | PRN

## 2021-10-19 MED ORDER — FUROSEMIDE 20 MG PO TABS: 10.0000 mg | ORAL_TABLET | Freq: Every day | ORAL | Status: DC

## 2021-10-19 MED ORDER — FAMOTIDINE 20 MG PO TABS
20.0000 mg | ORAL_TABLET | Freq: Every day | ORAL | Status: DC
  Administered 2021-10-20: 20 mg via ORAL
  Filled 2021-10-19: qty 1

## 2021-10-19 MED ORDER — ACETAMINOPHEN 650 MG RE SUPP: 650.0000 mg | Freq: Four times a day (QID) | RECTAL | Status: DC | PRN

## 2021-10-19 MED ORDER — LACTATED RINGERS IV SOLN: INTRAVENOUS | Status: DC

## 2021-10-19 MED ORDER — HYDROMORPHONE HCL 1 MG/ML IJ SOLN
0.5000 mg | INTRAMUSCULAR | Status: DC | PRN
  Administered 2021-10-19 – 2021-10-21 (×4): 0.5 mg via INTRAVENOUS
  Filled 2021-10-19: qty 1
  Filled 2021-10-19 (×3): qty 0.5

## 2021-10-19 MED ORDER — ACETAMINOPHEN 325 MG PO TABS: 650.0000 mg | ORAL_TABLET | Freq: Four times a day (QID) | ORAL | Status: DC | PRN

## 2021-10-19 MED ORDER — LEVOTHYROXINE SODIUM 75 MCG PO TABS
175.0000 ug | ORAL_TABLET | Freq: Every day | ORAL | Status: DC
  Administered 2021-10-20 – 2021-10-25 (×6): 175 ug via ORAL
  Filled 2021-10-19 (×7): qty 1

## 2021-10-19 NOTE — ED Notes (Signed)
Patient transported to CT 

## 2021-10-19 NOTE — H&P (Signed)
History and Physical    Linda Atkinson WLN:[id number] DOB: 04/12/1946 DOA: 10/19/2021  PCP: Mancel Bale, PA-C   Patient coming from: Home via [facility] ER  Chief Complaint: abdominal pain, nausea and vomiting  HPI: Linda Atkinson is a 75 y.o. female with medical history significant for DMT2, HFpEF, morbid obesity, RA, hypothyroidism who presents for evaluation of abdominal pain.  She reports that she has had 5 days of abdominal pain and bloating that is progressively worsened.  Last night she developed nausea and vomiting with severe pain that she rated as a 10 out of 10.  Pain is over her entire abdomen is mostly in the upper abdomen and sometimes radiates around her right side into her upper back.  She did vomit last night and this morning but states it is only mild as she had not eaten very much.  She has not had any change in her diet.  She denies any recent travel.  No trauma to her abdomen.  She reports she has not had any fever or chills.  She reports has never had pain like this in the past.  She has been using Tylenol and Aleve intermittently over the last few days which did not help last night or this morning.  ED Course: Linda Atkinson has been hemodynamically stable in the emergency room.  She is found to have acute pancreatitis on CT scan with no signs of necrosis.  Lipase is elevated at 1277.  CBC is unremarkable.  Lactic acid mildly elevated 2.3.  Sodium 132 potassium 3.5 chloride 97 bicarb 22 creatinine 0.76 BUN 14 glucose 315 alk phos is 96 AST 19 ALT 25 bilirubin 1.0.  Troponin 6 twice in the emergency room.  UA is yellow with greater than 500 glucose and large ketones but no leukocytes or nitrites.  COVID swab negative.  Influenza A and B negative.  Hospitalist service asked to admit for further management  Review of Systems:  General: Denies fever, chills, weight loss, night sweats.  Denies dizziness.  Denies change in appetite HENT: Denies head trauma, headache, denies change in  hearing, tinnitus.  Denies nasal congestion or bleeding.  Denies sore throat, sores in mouth.  Denies difficulty swallowing Eyes: Denies blurry vision, pain in eye, drainage.  Denies discoloration of eyes. Neck: Denies pain.  Denies swelling.  Denies pain with movement. Cardiovascular: Denies chest pain, palpitations.  Denies edema.  Denies orthopnea Respiratory: Denies shortness of breath, cough.  Denies wheezing.  Denies sputum production Gastrointestinal: Reports abdominal pain, swelling. Reports nausea, vomiting, diarrhea.  Denies melena.  Denies hematemesis. Musculoskeletal: Denies limitation of movement.  Denies deformity or swelling.  Denies pain.  Denies arthralgias or myalgias. Genitourinary: Denies pelvic pain.  Denies urinary frequency or hesitancy.  Denies dysuria.  Skin: Denies rash.  Denies petechiae, purpura, ecchymosis. Neurological:Denies syncope.  Denies seizure activity.  Denies paresthesia.  Denies slurred speech, drooping face.  Denies visual change. Psychiatric: Denies depression, anxiety.  Denies hallucinations.  Past Medical History:  Diagnosis Date   Allergy    Anxiety    about surgery   Arthritis    Cancer ([place]) 1972   "carcinoma in situ in pap smear"   Chest pain    Complication of anesthesia    Sept. 26 2013: "blood pressure dropped"    Depression    moderate   Diabetes ([place])    GERD (gastroesophageal reflux disease) 1996   hx of   Hypothyroidism    IBS (irritable bowel syndrome)  Joint pain    Pneumonia 2009   hx of   Pre-diabetes    Rheumatoid arthritis ([place])    Shortness of breath    with walking   SOB (shortness of breath)    Thyroid disease    Vitamin D deficiency     Past Surgical History:  Procedure Laterality Date   ABDOMINAL HYSTERECTOMY  1977   cataract surgery Bilateral 10/2015   cyst removed Left 1962   knee   DILATION AND CURETTAGE OF UTERUS  1972   PARATHYROIDECTOMY Right 07/13/2013   Procedure: RIGHT INFERIOR  PARATHYROIDECTOMY;  Surgeon: Ralene Ok, MD;  Location: [facility];  Service: General;  Laterality: Right;   right shoulder rotater cuff Right 2013   x2    Social History  reports that she quit smoking about 20 months ago. Her smoking use included cigarettes. She has a 25.00 pack-year smoking history. She has never used smokeless tobacco. She reports that she does not drink alcohol and does not use drugs.  Allergies  Allergen Reactions   Lipitor [Atorvastatin] Other (See Comments)    Muscle pain.    Family History  Problem Relation Age of Onset   Heart disease Father    Diabetes Father    Hyperlipidemia Father    Kidney disease Father    Alzheimer's disease Mother    High blood pressure Mother    Thyroid disease Mother    Cancer Brother        prostate   Alzheimer's disease Brother    Cancer Maternal Aunt        breast   Cancer Paternal Aunt        uterine   Cancer Paternal Grandmother        colon   Healthy Son      Prior to Admission medications   Medication Sig Start Date End Date Taking? Authorizing Provider  Cyanocobalamin (VITAMIN B-12) 2500 MCG SUBL Take 1 tablet by mouth every other day. 05/30/20   [provider]  famotidine (PEPCID) 20 MG tablet  04/06/21   [provider]  furosemide (LASIX) 20 MG tablet TAKE 1/2 TABLET(10 MG) BY MOUTH DAILY 03/26/21   Freddi Starr, MD  naproxen sodium (ALEVE) 220 MG tablet Take 220 mg by mouth as needed.    [provider]  RYBELSUS 3 MG TABS Take 1 tablet by mouth every morning. 04/18/21   [provider]  SYNTHROID 175 MCG tablet Take 175 mcg by mouth daily. 10/28/19   [provider]  Vitamin D, Ergocalciferol, (DRISDOL) 1.25 MG (50000 UNIT) CAPS capsule Take 50,000 Units by mouth once a week. 01/18/20   [provider]    Physical Exam: Vitals:   10/19/21 1430 10/19/21 1640 10/19/21 1740 10/19/21 1901  BP: 116/64 (!) 123/44  114/75  Pulse: 81 74 80 84  Resp: _0 Temp:    98.4 F (36.9 C)  TempSrc:    Oral  SpO2: 97% 96% 98%   Weight:        Constitutional: NAD, calm, comfortable Vitals:   10/19/21 1430 10/19/21 1640 10/19/21 1740 10/19/21 1901  BP: 116/64 (!) 123/44  114/75  Pulse: 81 74 80 84  Resp: _1 Temp:    98.4 F (36.9 C)  TempSrc:    Oral  SpO2: 97% 96% 98%   Weight:       General: WDWN, Alert and oriented x3.  Eyes: EOMI, PERRL, conjunctivae normal.  Sclera  nonicteric HENT:  [place]/AT, external ears normal.  Nares patent without epistasis.  Mucous membranes are dry. Posterior pharynx clear of any exudate or lesions Neck: Soft, normal range of motion, supple, no masses, no thyromegaly. Trachea midline Respiratory: clear to auscultation bilaterally, no wheezing, no crackles. Normal respiratory effort. No accessory muscle use.  Cardiovascular: Regular rate and rhythm, no murmurs / rubs / gallops. No extremity edema. 2+ pedal pulses.  Abdomen: Soft, Diffuse upper abdomen tenderness that is worse in RUQ, positive Murphy's sign, nondistended, no rebound or guarding.  No masses palpated. Morbid obesity. Bowel sounds hypoactive Musculoskeletal: FROM. no cyanosis. No joint deformity upper and lower extremities. Normal muscle tone.  Skin: Warm, dry, intact no rashes, lesions, ulcers. No induration Neurologic: CN 2-12 grossly intact.  Normal speech.  Sensation intact. Strength 5/5 in all extremities.   Psychiatric: Normal judgment and insight.  Normal mood.    Labs on Admission: I have personally reviewed following labs and imaging studies  CBC: Recent Labs  Lab 10/19/21 1230  WBC 9.8  NEUTROABS 8.5*  HGB 13.3  HCT 39.2  MCV 83.6  PLT 010    Basic Metabolic Panel: Recent Labs  Lab 10/19/21 1230  NA 132*  K 3.5  CL 97*  CO2 22  GLUCOSE 315*  BUN 14  CREATININE 0.76  CALCIUM 10.3    GFR: Estimated Creatinine Clearance: 81.9 mL/min (by C-G formula based on SCr of 0.76 mg/dL).  Liver Function  Tests: Recent Labs  Lab 10/19/21 1230  AST 19  ALT 25  ALKPHOS 96  BILITOT 1.0  PROT 6.7  ALBUMIN 4.0    Urine analysis:    Component Value Date/Time   COLORURINE YELLOW 10/19/2021 1300   APPEARANCEUR CLEAR 10/19/2021 1300   LABSPEC 1.020 10/19/2021 1300   PHURINE 5.0 10/19/2021 1300   GLUCOSEU >=500 (A) 10/19/2021 1300   HGBUR NEGATIVE 10/19/2021 1300   BILIRUBINUR NEGATIVE 10/19/2021 1300   KETONESUR >=80 (A) 10/19/2021 1300   PROTEINUR NEGATIVE 10/19/2021 1300   NITRITE NEGATIVE 10/19/2021 [place] 10/19/2021 1300    Radiological Exams on Admission: CT ANGIO CHEST/ABD/PEL FOR DISSECTION W &/OR WO CONTRAST  Result Date: 10/19/2021 CLINICAL DATA:  Back pain, chest pain and abdominal pain for a few days with fatigue. EXAM: CT ANGIOGRAPHY CHEST, ABDOMEN AND PELVIS TECHNIQUE: Non-contrast CT of the chest was initially obtained. Multidetector CT imaging through the chest, abdomen and pelvis was performed using the standard protocol during bolus administration of intravenous contrast. Multiplanar reconstructed images and MIPs were obtained and reviewed to evaluate the vascular anatomy. CONTRAST:  135m OMNIPAQUE IOHEXOL 350 MG/ML SOLN COMPARISON:  CT June 17, 2020. FINDINGS: CTA CHEST FINDINGS Cardiovascular: Non contrasted series demonstrates aortic atherosclerosis without intramural hematoma. No thoracic aortic aneurysm or dissection. No central pulmonary embolus on this nondedicated study. Normal size heart. No significant pericardial effusion/thickening. Calcifications of the aortic annulus. Coronary artery calcifications. Mediastinum/Nodes: No pathologically enlarged mediastinal, hilar or axillary lymph nodes. The trachea and esophagus are unremarkable. Lungs/Pleura: No suspicious pulmonary nodules or masses. No pleural effusion. No pneumothorax. No focal airspace consolidation. Musculoskeletal: Advanced right greater than left glenohumeral degenerative change.  Thoracic spondylosis. No acute osseous abnormality. Review of the MIP images confirms the above findings. CTA ABDOMEN AND PELVIS FINDINGS VASCULAR Aorta: Calcified noncalcified atherosclerotic plaque. Normal caliber aorta without aneurysm, dissection, vasculitis or significant stenosis. Celiac: Patent without evidence of aneurysm, dissection, vasculitis or significant stenosis. SMA: Patent without evidence of aneurysm, dissection, vasculitis or significant stenosis. Renals:  Both renal arteries are patent without evidence of aneurysm, dissection, vasculitis, fibromuscular dysplasia or significant stenosis. IMA: Patent without evidence of aneurysm, dissection, vasculitis or significant stenosis. Inflow: Patent without evidence of aneurysm, dissection, vasculitis or significant stenosis. Veins: No obvious venous abnormality within the limitations of this arterial phase study. Review of the MIP images confirms the above findings. NON-VASCULAR Hepatobiliary: Diffuse hepatic steatosis. Gallbladder is unremarkable. No biliary ductal dilation. Pancreas: Edematous appearance of the pancreas with peripancreatic fluid, without hypoenhancing areas of the pancreatic parenchyma, walled off fluid collections or pancreatic ductal dilation. Spleen: Within normal limits. Adrenals/Urinary Tract: Bilateral adrenal glands are unremarkable. No hydronephrosis. 1.7 cm left renal cyst. Additional tiny bilateral hypodense renal lesions are technically too small to accurately characterize but statistically likely reflect cysts. Urinary bladder is unremarkable for degree of distension. Stomach/Bowel: No radiopaque enteric contrast was administered. Stomach is unremarkable for degree of distension. There is inflammation along the first second and third portions of the duodenum favored reactive related to pancreatitis. No pathologic dilation of small or large bowel. The appendix and terminal ileum appear normal. The colon is predominately  decompressed limiting evaluation. Lymphatic: No pathologically enlarged abdominal lymph nodes. Reproductive: Status post hysterectomy. No adnexal masses. Other: No pneumoperitoneum.  No walled off fluid collections. Musculoskeletal: Thoracolumbar spondylosis. Degenerative changes bilateral hips. No acute osseous abnormality. Review of the MIP images confirms the above findings. IMPRESSION: 1. No evidence of aortic aneurysm or dissection. 2. Acute interstitial pancreatitis without evidence of pancreatic necrosis or walled off fluid collections. 3. Inflammatory changes along the first second and third portions of the duodenum favored reactive. 4. Diffuse hepatic steatosis. 5.  Aortic Atherosclerosis (ICD10-I70.0). Electronically Signed   By: Dahlia Bailiff M.D.   On: 10/19/2021 14:34    EKG: Independently reviewed.  EKG shows normal sinus rhythm with no acute ST elevation or depression.  QTc 409  Assessment/Plan Principal Problem:   Acute pancreatitis without infection or necrosis Linda Atkinson has acute pancreatitis with elevated lipase and pancreatitis on CT scan. Clear liquid diet as tolerated.  Studies have shown that continuing diet as tolerated is safe and leads to faster resolution then being n.p.o. Dilaudid 1 mg IV every 3 hours as needed for pain overnight. Fluid hydration with LR at 100 mL/h overnight Incentive spirometer every 2 hours while awake Zofran for nausea and vomiting as needed Obtain Gallbladder u/s for further evaluation.  Recheck lactic acid level  Active Problems:   Diabetes type 2, controlled  Patient reports she takes metformin twice a day at home with this will be held for 48 hours after receiving IV contrast for CT angiography in the emergency room Start Jardiance for diabetes control.  Jardiance also help protect kidneys and cardiovascular system with diabetes and history of CHF Pt reports she takes a cholesterol medication but cannot remember the name.  Check lipid  panel    Hypothyroidism (acquired) Continue Synthroid.    Chronic heart failure with preserved ejection fraction (HFpEF)  Patient's last echocardiogram in the system was in August 2021 and results were reviewed.  Patient on Jardiance to assist with CHF treatment.  Continue Lasix    Rheumatoid arthritis Chronic    Morbid obesity Follow-up with PCP for dietary, lifestyle, pharmacotherapy interventions and/or referral for bariatric surgery for long-term weight loss     DVT prophylaxis: Lovenox for DVT prophylaxis.  Code Status:   Full Code  Family Communication:  Diagnosis and plan discussed with patient.  Questions answered.  Patient verbalized understanding and agrees  to plan.  Further recommendations to follow as clinical indicated Disposition Plan:   Patient is from:  Home  Anticipated DC to:  Home  Anticipated DC date:  Anticipate 2 midnight or more stay in the hospital   Admission status:  Inpatient   Yevonne Aline Trenia Tennyson MD [facility]  How to contact the [facility] Attending or Consulting provider [place] or covering provider during after hours [place], for this patient?   Check the care team in [facility] and look for a) attending/consulting [facility] provider listed and b) the [facility] team listed Log into [email or web address] and use [place]'s universal password to access. If you do not have the password, please contact the hospital operator. Locate the [facility] provider you are looking for under [facility] and page to a number that you can be directly reached. If you still have difficulty reaching the provider, please page the [facility] (Director on Call) for the Hospitalists listed on amion for assistance.  10/19/2021, 7:41 PM

## 2021-10-19 NOTE — ED Provider Notes (Signed)
[facility] EMERGENCY DEPARTMENT Provider Note   CSN: [id number] Arrival date & time: 10/19/21  1155     History Chief Complaint  Patient presents with   Chest Pain    Linda Atkinson is a 75 y.o. female.  Patient presents to the emergency department for evaluation of chest pain and abdominal pain.  Patient reports that she has not been feeling well for a couple of days.  She had some diarrhea couple of days ago and then yesterday started having abdominal pain with nausea.  She did vomit 1 time, mostly "bile".  Patient reports that her abdomen is distended and tight.  Today she started having pain in her chest that goes into her back.  No associated shortness of breath.      Past Medical History:  Diagnosis Date   Allergy    Anxiety    about surgery   Arthritis    Cancer ([place]) 1972   "carcinoma in situ in pap smear"   Chest pain    Complication of anesthesia    Sept. 26 2013: "blood pressure dropped"    Depression    moderate   Diabetes (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   Rheumatoid arthritis ([place]) 06/18/2017   Insomnia 08/01/2016   Diabetes type 2, controlled ([place]) 04/19/2014   Tobacco abuse 04/18/2014   Arthralgia 06/29/2013   Hyperparathyroidism ([place]) 06/11/2013   Hypothyroidism (acquired) 06/11/2013    Past Surgical History:  Procedure Laterality Date   ABDOMINAL HYSTERECTOMY  1977   cataract surgery Bilateral 10/2015   cyst removed Left 1962   knee   DILATION AND CURETTAGE OF UTERUS  1972   PARATHYROIDECTOMY Right 07/13/2013   Procedure:  RIGHT INFERIOR PARATHYROIDECTOMY;  Surgeon: Ralene Ok, MD;  Location: [facility];  Service: General;  Laterality: Right;   right shoulder rotater cuff Right 2013   x2     OB History     Gravida  1   Para  1   Term      Preterm      AB      Living         SAB      IAB      Ectopic      Multiple      Live Births              Family History  Problem Relation Age of Onset   Heart disease Father    Diabetes Father    Hyperlipidemia Father    Kidney disease Father    Alzheimer's disease Mother    High blood pressure Mother    Thyroid disease Mother    Cancer Brother        prostate   Alzheimer's disease Brother    Cancer Maternal Aunt        breast   Cancer Paternal Aunt        uterine   Cancer Paternal Grandmother  colon   Healthy Son     Social History   Tobacco Use   Smoking status: Former    Packs/day: 0.50    Years: 50.00    Pack years: 25.00    Types: Cigarettes    Quit date: 02/16/2020    Years since quitting: 1.6   Smokeless tobacco: Never  Vaping Use   Vaping Use: Never used  Substance Use Topics   Alcohol use: No    Alcohol/week: 0.0 standard drinks   Drug use: No    Home Medications Prior to Admission medications   Medication Sig Start Date End Date Taking? Authorizing Provider  Cyanocobalamin (VITAMIN B-12) 2500 MCG SUBL Take 1 tablet by mouth every other day. 05/30/20   [provider]  famotidine (PEPCID) 20 MG tablet  04/06/21   [provider]  furosemide (LASIX) 20 MG tablet TAKE 1/2 TABLET(10 MG) BY MOUTH DAILY 03/26/21   Freddi Starr, MD  naproxen sodium (ALEVE) 220 MG tablet Take 220 mg by mouth as needed.    [provider]  RYBELSUS 3 MG TABS Take 1 tablet by mouth every morning. 04/18/21   [provider]  SYNTHROID 175 MCG tablet Take 175 mcg by mouth daily. 10/28/19   [provider]  Vitamin D, Ergocalciferol, (DRISDOL) 1.25 MG (50000 UNIT) CAPS capsule Take  50,000 Units by mouth once a week. 01/18/20   [provider]    Allergies    Lipitor [atorvastatin]  Review of Systems   Review of Systems  Cardiovascular:  Positive for chest pain.  Gastrointestinal:  Positive for abdominal pain.  Musculoskeletal:  Positive for back pain.  All other systems reviewed and are negative.  Physical Exam Updated Vital Signs BP 116/64    Pulse 81    Temp 98 F (36.7 C) (Oral)    Resp 17    Wt 117.9 kg    SpO2 97%    BMI 40.72 kg/m   Physical Exam Vitals and nursing note reviewed.  Constitutional:      General: She is not in acute distress.    Appearance: Normal appearance. She is well-developed.  HENT:     Head: Normocephalic and atraumatic.     Right Ear: Hearing normal.     Left Ear: Hearing normal.     Nose: Nose normal.  Eyes:     Conjunctiva/sclera: Conjunctivae normal.     Pupils: Pupils are equal, round, and reactive to light.  Cardiovascular:     Rate and Rhythm: Regular rhythm.     Heart sounds: S1 normal and S2 normal. No murmur heard.   No friction rub. No gallop.  Pulmonary:     Effort: Pulmonary effort is normal. No respiratory distress.     Breath sounds: Normal breath sounds.  Chest:     Chest wall: No tenderness.  Abdominal:     General: Bowel sounds are normal. There is distension.     Palpations: Abdomen is soft.     Tenderness: There is generalized abdominal tenderness. There is no guarding or rebound. Negative signs include Murphy's sign and McBurney's sign.     Hernia: No hernia is present.  Musculoskeletal:        General: Normal range of motion.     Cervical back: Normal range of motion and neck supple.  Skin:    General: Skin is warm and dry.     Findings: No rash.  Neurological:     Mental Status: She is alert and oriented  to person, place, and time.     GCS: GCS eye subscore is 4. GCS verbal subscore is 5. GCS motor subscore is 6.     Cranial Nerves: No cranial nerve deficit.     Sensory: No sensory  deficit.     Coordination: Coordination normal.  Psychiatric:        Speech: Speech normal.        Behavior: Behavior normal.        Thought Content: Thought content normal.    ED Results / Procedures / Treatments   Labs (all labs ordered are listed, but only abnormal results are displayed) Labs Reviewed  CBC WITH DIFFERENTIAL/PLATELET - Abnormal; Notable for the following components:      Result Value   Neutro Abs 8.5 (*)    All other components within normal limits  COMPREHENSIVE METABOLIC PANEL - Abnormal; Notable for the following components:   Sodium 132 (*)    Chloride 97 (*)    Glucose, Bld 315 (*)    All other components within normal limits  LACTIC ACID, PLASMA - Abnormal; Notable for the following components:   Lactic Acid, Venous 2.3 (*)    All other components within normal limits  LIPASE, BLOOD - Abnormal; Notable for the following components:   Lipase 1,277 (*)    All other components within normal limits  URINALYSIS, ROUTINE W REFLEX MICROSCOPIC - Abnormal; Notable for the following components:   Glucose, UA >=500 (*)    Ketones, ur >=80 (*)    All other components within normal limits  URINALYSIS, MICROSCOPIC (REFLEX) - Abnormal; Notable for the following components:   Bacteria, UA RARE (*)    All other components within normal limits  RESP PANEL BY RT-PCR (FLU A&B, COVID) ARPGX2  TRIGLYCERIDES  TROPONIN I (HIGH SENSITIVITY)  TROPONIN I (HIGH SENSITIVITY)    EKG EKG Interpretation  Date/Time:  Thursday October 19 2021 12:09:13 EST Ventricular Rate:  95 PR Interval:  231 QRS Duration: 91 QT Interval:  325 QTC Calculation: 409 R Axis:   -33 Text Interpretation: Sinus rhythm Prolonged PR interval Left axis deviation Borderline repolarization abnormality Confirmed by Orpah Greek [phone]) on 10/19/2021 12:15:35 PM  Radiology CT ANGIO CHEST/ABD/PEL FOR DISSECTION W &/OR WO CONTRAST  Result Date: 10/19/2021 CLINICAL DATA:  Back pain, chest  pain and abdominal pain for a few days with fatigue. EXAM: CT ANGIOGRAPHY CHEST, ABDOMEN AND PELVIS TECHNIQUE: Non-contrast CT of the chest was initially obtained. Multidetector CT imaging through the chest, abdomen and pelvis was performed using the standard protocol during bolus administration of intravenous contrast. Multiplanar reconstructed images and MIPs were obtained and reviewed to evaluate the vascular anatomy. CONTRAST:  118mL OMNIPAQUE IOHEXOL 350 MG/ML SOLN COMPARISON:  CT June 17, 2020. FINDINGS: CTA CHEST FINDINGS Cardiovascular: Non contrasted series demonstrates aortic atherosclerosis without intramural hematoma. No thoracic aortic aneurysm or dissection. No central pulmonary embolus on this nondedicated study. Normal size heart. No significant pericardial effusion/thickening. Calcifications of the aortic annulus. Coronary artery calcifications. Mediastinum/Nodes: No pathologically enlarged mediastinal, hilar or axillary lymph nodes. The trachea and esophagus are unremarkable. Lungs/Pleura: No suspicious pulmonary nodules or masses. No pleural effusion. No pneumothorax. No focal airspace consolidation. Musculoskeletal: Advanced right greater than left glenohumeral degenerative change. Thoracic spondylosis. No acute osseous abnormality. Review of the MIP images confirms the above findings. CTA ABDOMEN AND PELVIS FINDINGS VASCULAR Aorta: Calcified noncalcified atherosclerotic plaque. Normal caliber aorta without aneurysm, dissection, vasculitis or significant stenosis. Celiac: Patent without evidence of aneurysm, dissection, vasculitis  or significant stenosis. SMA: Patent without evidence of aneurysm, dissection, vasculitis or significant stenosis. Renals: Both renal arteries are patent without evidence of aneurysm, dissection, vasculitis, fibromuscular dysplasia or significant stenosis. IMA: Patent without evidence of aneurysm, dissection, vasculitis or significant stenosis. Inflow: Patent without  evidence of aneurysm, dissection, vasculitis or significant stenosis. Veins: No obvious venous abnormality within the limitations of this arterial phase study. Review of the MIP images confirms the above findings. NON-VASCULAR Hepatobiliary: Diffuse hepatic steatosis. Gallbladder is unremarkable. No biliary ductal dilation. Pancreas: Edematous appearance of the pancreas with peripancreatic fluid, without hypoenhancing areas of the pancreatic parenchyma, walled off fluid collections or pancreatic ductal dilation. Spleen: Within normal limits. Adrenals/Urinary Tract: Bilateral adrenal glands are unremarkable. No hydronephrosis. 1.7 cm left renal cyst. Additional tiny bilateral hypodense renal lesions are technically too small to accurately characterize but statistically likely reflect cysts. Urinary bladder is unremarkable for degree of distension. Stomach/Bowel: No radiopaque enteric contrast was administered. Stomach is unremarkable for degree of distension. There is inflammation along the first second and third portions of the duodenum favored reactive related to pancreatitis. No pathologic dilation of small or large bowel. The appendix and terminal ileum appear normal. The colon is predominately decompressed limiting evaluation. Lymphatic: No pathologically enlarged abdominal lymph nodes. Reproductive: Status post hysterectomy. No adnexal masses. Other: No pneumoperitoneum.  No walled off fluid collections. Musculoskeletal: Thoracolumbar spondylosis. Degenerative changes bilateral hips. No acute osseous abnormality. Review of the MIP images confirms the above findings. IMPRESSION: 1. No evidence of aortic aneurysm or dissection. 2. Acute interstitial pancreatitis without evidence of pancreatic necrosis or walled off fluid collections. 3. Inflammatory changes along the first second and third portions of the duodenum favored reactive. 4. Diffuse hepatic steatosis. 5.  Aortic Atherosclerosis (ICD10-I70.0).  Electronically Signed   By: Dahlia Bailiff M.D.   On: 10/19/2021 14:34    Procedures Procedures   Medications Ordered in ED Medications  iohexol (OMNIPAQUE) 350 MG/ML injection 125 mL (125 mLs Intravenous Contrast Given 10/19/21 1355)  sodium chloride 0.9 % bolus 500 mL (500 mLs Intravenous New Bag/Given 10/19/21 1409)    ED Course  I have reviewed the triage vital signs and the nursing notes.  Pertinent labs & imaging results that were available during my care of the patient were reviewed by me and considered in my medical decision making (see chart for details).    MDM Rules/Calculators/A&P                           Presents to the emergency department for evaluation of abdominal pain, back pain, chest pain.  Symptoms present for couple of days, now persistent and severe today.  Patient found to have significantly elevated lipase consistent with pancreatitis.  Imaging shows inflammatory changes without complication.  Etiology unclear.  She does not have a gallstone, bile duct stone.  She does not drink.  She does take Rybelsus, possible etiology.  Will hospitalize for further work-up. Final Clinical Impression(s) / ED Diagnoses Final diagnoses:  Acute pancreatitis without infection or necrosis, unspecified pancreatitis type    Rx / DC Orders ED Discharge Orders     None        Orpah Greek, MD 10/19/21 1516

## 2021-10-19 NOTE — ED Notes (Signed)
[facility] at bedside to transport pt.  Pt stable.

## 2021-10-19 NOTE — ED Notes (Signed)
[facility] called for report, ETA 10 min

## 2021-10-19 NOTE — ED Notes (Signed)
Ambulated to bathroom with steady gait

## 2021-10-19 NOTE — ED Triage Notes (Signed)
Pt c/o abdominal, chest and back pain the past few days with fatigue. Mid to left sided chest pain. States hx of GERD. States diarrhea yesterday, nausea/vomiting last night.

## 2021-10-19 NOTE — Progress Notes (Signed)
Lactic acid 2.0 lab called nurse with critical results but resulted today at 1250 2.3 higher results

## 2021-10-20 ENCOUNTER — Encounter (HOSPITAL_COMMUNITY): Payer: Self-pay | Admitting: Internal Medicine

## 2021-10-20 ENCOUNTER — Inpatient Hospital Stay (HOSPITAL_COMMUNITY): Payer: PPO

## 2021-10-20 DIAGNOSIS — E119 Type 2 diabetes mellitus without complications: Secondary | ICD-10-CM

## 2021-10-20 DIAGNOSIS — I5032 Chronic diastolic (congestive) heart failure: Secondary | ICD-10-CM

## 2021-10-20 LAB — CBC
HCT: 41 % (ref 36.0–46.0)
Hemoglobin: 13 g/dL (ref 12.0–15.0)
MCH: 27.3 pg (ref 26.0–34.0)
MCHC: 31.7 g/dL (ref 30.0–36.0)
MCV: 86.1 fL (ref 80.0–100.0)
Platelets: 189 10*3/uL (ref 150–400)
RBC: 4.76 MIL/uL (ref 3.87–5.11)
RDW: 14.1 % (ref 11.5–15.5)
WBC: 11 10*3/uL — ABNORMAL HIGH (ref 4.0–10.5)
nRBC: 0 % (ref 0.0–0.2)

## 2021-10-20 LAB — BASIC METABOLIC PANEL
Anion gap: 7 (ref 5–15)
BUN: 13 mg/dL (ref 8–23)
CO2: 26 mmol/L (ref 22–32)
Calcium: 10 mg/dL (ref 8.9–10.3)
Chloride: 100 mmol/L (ref 98–111)
Creatinine, Ser: 0.82 mg/dL (ref 0.44–1.00)
GFR, Estimated: >60 mL/min (ref >60)
Glucose, Bld: 242 mg/dL — ABNORMAL HIGH (ref 70–99)
Potassium: 3.3 mmol/L — ABNORMAL LOW (ref 3.5–5.1)
Sodium: 133 mmol/L — ABNORMAL LOW (ref 135–145)

## 2021-10-20 LAB — LIPID PANEL
Cholesterol: 151 mg/dL (ref 0–200)
HDL: 42 mg/dL (ref >40)
LDL Cholesterol: 86 mg/dL (ref 0–99)
Total CHOL/HDL Ratio: 3.6 RATIO
Triglycerides: 115 mg/dL (ref <150)
VLDL: 23 mg/dL (ref 0–40)

## 2021-10-20 LAB — GLUCOSE, CAPILLARY
Glucose-Capillary: 208 mg/dL — ABNORMAL HIGH (ref 70–99)
Glucose-Capillary: 208 mg/dL — ABNORMAL HIGH (ref 70–99)
Glucose-Capillary: 210 mg/dL — ABNORMAL HIGH (ref 70–99)
Glucose-Capillary: 224 mg/dL — ABNORMAL HIGH (ref 70–99)

## 2021-10-20 LAB — LACTIC ACID, PLASMA: Lactic Acid, Venous: 1.4 mmol/L (ref 0.5–1.9)

## 2021-10-20 LAB — MAGNESIUM: Magnesium: 1.8 mg/dL (ref 1.7–2.4)

## 2021-10-20 MED ORDER — INSULIN GLARGINE-YFGN 100 UNIT/ML ~~LOC~~ SOLN
12.0000 [IU] | Freq: Every day | SUBCUTANEOUS | Status: DC
  Administered 2021-10-20 – 2021-10-25 (×6): 12 [IU] via SUBCUTANEOUS
  Filled 2021-10-20 (×6): qty 0.12

## 2021-10-20 MED ORDER — POTASSIUM CHLORIDE CRYS ER 20 MEQ PO TBCR
40.0000 meq | EXTENDED_RELEASE_TABLET | Freq: Once | ORAL | Status: AC
  Administered 2021-10-20: 40 meq via ORAL
  Filled 2021-10-20: qty 2

## 2021-10-20 MED ORDER — PANTOPRAZOLE SODIUM 40 MG IV SOLR
40.0000 mg | Freq: Two times a day (BID) | INTRAVENOUS | Status: DC
  Administered 2021-10-20 – 2021-10-25 (×10): 40 mg via INTRAVENOUS
  Filled 2021-10-20 (×10): qty 40

## 2021-10-20 NOTE — Progress Notes (Signed)
°  Transition of Care [facility]) Screening Note   Patient Details  Name: Linda Atkinson Date of Birth: [birth year]   Transition of [facility]) CM/SW Contact:    Benard Halsted, LCSW Phone Number: 10/20/2021, 4:07 PM    Transition of Care Department [facility]) has reviewed patient and no TOC needs have been identified at this time. We will continue to monitor patient advancement through interdisciplinary progression rounds. If new patient transition needs arise, please place a TOC consult.

## 2021-10-20 NOTE — Progress Notes (Signed)
Inpatient Diabetes Program Recommendations  AACE/ADA: New Consensus Statement on Inpatient Glycemic Control (2015)  Target Ranges:  Prepandial:   less than 140 mg/dL      Peak postprandial:   less than 180 mg/dL (1-2 hours)      Critically ill patients:  140 - 180 mg/dL   Lab Results  Component Value Date   GLUCAP 208 (H) 10/20/2021   HGBA1C 9.6 (H) 10/19/2021    Review of Glycemic Control  Latest Reference Range & Units 10/19/21 21:48 10/20/21 07:57 10/20/21 12:29  Glucose-Capillary 70 - 99 mg/dL 248 (H) 208 (H) 208 (H)  (H): Data is abnormally high Diabetes history: Type 2 DM Outpatient Diabetes medications: Metformin 500 mg BID Current orders for Inpatient glycemic control: Novolog 0-15 units TID & HS, Semglee 12 units QHS  Inpatient Diabetes Program Recommendations:    Spoke with patient regarding outpatient diabetes management.  Reviewed patient's current A1c of 9.6%. Explained what a A1c is and what it measures. Also reviewed goal A1c with patient, importance of good glucose control @ home, and blood sugar goals. Reviewed patho of DM, need for improved control, signs and symptoms of hypo vs hyper, GLPs & pancreatitis, interventions, survival skills, CHO mindfulness, vascular changes, impact of inflammation and infection with poor glycemic control, recommendations for checking CBGs, alternatives to sugary beverages, and comorbidites.  Encouraged to learn and self administer insulin while inpatient in the event patient discharged with insulin. Plan for follow up on 11/07/21. No further questions at this time.   Thanks, Bronson Curb, MSN, RNC-OB Diabetes Coordinator [phone] (8a-5p)

## 2021-10-20 NOTE — Progress Notes (Signed)
PROGRESS NOTE        PATIENT DETAILS Name: Linda Atkinson Age: 75 y.o. Sex: female Date of Birth: 10/20/1946 Admit Date: 10/19/2021 Admitting Physician Norval Morton, MD [facility]:ENIDP, Damaris Hippo, PA-C  Brief Narrative: Patient is a 75 y.o. female with history of DM-2, HFpEF, hypothyroidism, RA-who presented with approximately 5-day history of abdominal pain, nausea, vomiting-found to have acute pancreatitis.  Subjective: Feeling better than yesterday-continues to have abdominal pain but less intense than yesterday.  Objective: Vitals: Blood pressure 117/79, pulse 83, temperature 98.5 F (36.9 C), temperature source Oral, resp. rate 18, height 5\' 6"  (1.676 m), weight 118.8 kg, SpO2 90 %.   Exam: Gen Exam:Alert awake-not in any distress HEENT:atraumatic, normocephalic Chest: B/L clear to auscultation anteriorly CVS:S1S2 regular Abdomen: Soft-mild guarding-upper abdominal tenderness without any peritoneal signs. Extremities:no edema Neurology: Non focal Skin: no rash  Pertinent Labs/Radiology: Recent Labs  Lab 10/19/21 1230 10/20/21 0044  WBC 9.8 11.0*  HGB 13.3 13.0  PLT 211 189  NA 132* 133*  K 3.5 3.3*  CREATININE 0.76 0.82  AST 19  --   ALT 25  --   ALKPHOS 96  --   BILITOT 1.0  --     Assessment/Plan: Acute pancreatitis: Etiology unknown-no history of alcohol use-no gallstones seen on CT abdomen/RUQ ultrasound.  Triglycerides within normal limits.  Could be ?  Lasix (no longer on semaglutide since last December).  Plans are to continue supportive care-IVF-antiemetics and as needed narcotics.  We will continue with a clear liquid diet for now and reassess tomorrow.  Will discuss with GI but suspect may need a EUS in the outpatient setting once pancreatitis resolves.  Hypokalemia: Replete and recheck  Chronic diastolic heart failure: Euvolemic on exam-hold Lasix-watch closely while on IVF acute pancreatitis:  DM-2 (A1c 9.6): Claims she is  only on metformin as an outpatient-no longer on semaglutide for almost a year.  Continue SSI-add Semglee 10 units-and follow closely.  Recent Labs    10/19/21 2148 10/20/21 0757 10/20/21 1229  GLUCAP 248* 208* 208*    Hypothyroidism: Continue Synthroid  Morbid Obesity Estimated body mass index is 42.29 kg/m as calculated from the following:   Height as of this encounter: 5\' 6"  (1.676 m).   Weight as of this encounter: 118.8 kg.    Procedures: None Consults: None DVT Prophylaxis: Lovenox Code Status:Full code  Family Communication: None at bedside  Time spent: 35 minutes-Greater than 50% of this time was spent in counseling, explanation of diagnosis, planning of further management, and coordination of care.   Disposition Plan: Status is: Inpatient  Remains inpatient appropriate because: Acute pancreatitis-supportive care in progress-remains on clear liquids-still with a lot of symptoms-not yet stable for discharge.    Diet: Diet Order             Diet clear liquid Room service appropriate? Yes; Fluid consistency: Thin  Diet effective now                     Antimicrobial agents: Anti-infectives (From admission, onward)    None        MEDICATIONS: Scheduled Meds:  enoxaparin (LOVENOX) injection  50 mg Subcutaneous Q24H   famotidine  20 mg Oral Daily   insulin aspart  0-15 Units Subcutaneous TID WC & HS   levothyroxine  175 mcg Oral Q0600  potassium chloride  40 mEq Oral Once   Continuous Infusions:  lactated ringers 125 mL/hr at 10/20/21 0722   PRN Meds:.acetaminophen **OR** acetaminophen, HYDROmorphone (DILAUDID) injection, HYDROmorphone (DILAUDID) injection, ondansetron (ZOFRAN) IV   I have personally reviewed following labs and imaging studies  LABORATORY DATA: CBC: Recent Labs  Lab 10/19/21 1230 10/20/21 0044  WBC 9.8 11.0*  NEUTROABS 8.5*  --   HGB 13.3 13.0  HCT 39.2 41.0  MCV 83.6 86.1  PLT 211 458    Basic Metabolic  Panel: Recent Labs  Lab 10/19/21 1230 10/20/21 0044  NA 132* 133*  K 3.5 3.3*  CL 97* 100  CO2 22 26  GLUCOSE 315* 242*  BUN 14 13  CREATININE 0.76 0.82  CALCIUM 10.3 10.0    GFR: Estimated Creatinine Clearance: 79 mL/min (by C-G formula based on SCr of 0.82 mg/dL).  Liver Function Tests: Recent Labs  Lab 10/19/21 1230  AST 19  ALT 25  ALKPHOS 96  BILITOT 1.0  PROT 6.7  ALBUMIN 4.0   Recent Labs  Lab 10/19/21 1230  LIPASE 1,277*   No results for input(s): AMMONIA in the last 168 hours.  Coagulation Profile: No results for input(s): INR, PROTIME in the last 168 hours.  Cardiac Enzymes: No results for input(s): CKTOTAL, CKMB, CKMBINDEX, TROPONINI in the last 168 hours.  BNP (last 3 results) No results for input(s): PROBNP in the last 8760 hours.  Lipid Profile: Recent Labs    10/19/21 1514 10/20/21 0044  CHOL  --  151  HDL  --  42  LDLCALC  --  86  TRIG 101 115  CHOLHDL  --  3.6    Thyroid Function Tests: No results for input(s): TSH, T4TOTAL, FREET4, T3FREE, THYROIDAB in the last 72 hours.  Anemia Panel: No results for input(s): VITAMINB12, FOLATE, FERRITIN, TIBC, IRON, RETICCTPCT in the last 72 hours.  Urine analysis:    Component Value Date/Time   COLORURINE YELLOW 10/19/2021 1300   APPEARANCEUR CLEAR 10/19/2021 1300   LABSPEC 1.020 10/19/2021 1300   PHURINE 5.0 10/19/2021 1300   GLUCOSEU >=500 (A) 10/19/2021 1300   HGBUR NEGATIVE 10/19/2021 1300   BILIRUBINUR NEGATIVE 10/19/2021 1300   KETONESUR >=80 (A) 10/19/2021 1300   PROTEINUR NEGATIVE 10/19/2021 1300   NITRITE NEGATIVE 10/19/2021 1300   LEUKOCYTESUR NEGATIVE 10/19/2021 1300    Sepsis Labs: Lactic Acid, Venous    Component Value Date/Time   LATICACIDVEN 1.4 10/20/2021 0044    MICROBIOLOGY: Recent Results (from the past 240 hour(s))  Resp Panel by RT-PCR (Flu A&B, Covid) Nasopharyngeal Swab     Status: None   Collection Time: 10/19/21  2:55 PM   Specimen: Nasopharyngeal  Swab; Nasopharyngeal(NP) swabs in vial transport medium  Result Value Ref Range Status   SARS Coronavirus 2 by RT PCR NEGATIVE NEGATIVE Final    Comment: (NOTE) SARS-CoV-2 target nucleic acids are NOT DETECTED.  The SARS-CoV-2 RNA is generally detectable in upper respiratory specimens during the acute phase of infection. The lowest concentration of SARS-CoV-2 viral copies this assay can detect is 138 copies/mL. A negative result does not preclude SARS-Cov-2 infection and should not be used as the sole basis for treatment or other patient management decisions. A negative result may occur with  improper specimen collection/handling, submission of specimen other than nasopharyngeal swab, presence of viral mutation(s) within the areas targeted by this assay, and inadequate number of viral copies(<138 copies/mL). A negative result must be combined with clinical observations, patient history, and epidemiological information. The  expected result is Negative.  Fact Sheet for Patients:  EntrepreneurPulse.com.au  Fact Sheet for Healthcare Providers:  IncredibleEmployment.be  This test is no t yet approved or cleared by the [place] FDA and  has been authorized for detection and/or diagnosis of SARS-CoV-2 by FDA under an Emergency Use Authorization (EUA). This EUA will remain  in effect (meaning this test can be used) for the duration of the COVID-19 declaration under Section 564(b)(1) of the Act, 21 U.S.C.section 360bbb-3(b)(1), unless the authorization is terminated  or revoked sooner.       Influenza A by PCR NEGATIVE NEGATIVE Final   Influenza B by PCR NEGATIVE NEGATIVE Final    Comment: (NOTE) The Xpert Xpress SARS-CoV-2/FLU/RSV plus assay is intended as an aid in the diagnosis of influenza from Nasopharyngeal swab specimens and should not be used as a sole basis for treatment. Nasal washings and aspirates are unacceptable for Xpert Xpress  SARS-CoV-2/FLU/RSV testing.  Fact Sheet for Patients: EntrepreneurPulse.com.au  Fact Sheet for Healthcare Providers: IncredibleEmployment.be  This test is not yet approved or cleared by the [place] FDA and has been authorized for detection and/or diagnosis of SARS-CoV-2 by FDA under an Emergency Use Authorization (EUA). This EUA will remain in effect (meaning this test can be used) for the duration of the COVID-19 declaration under Section 564(b)(1) of the Act, 21 U.S.C. section 360bbb-3(b)(1), unless the authorization is terminated or revoked.  Performed at [facility], [address] [place], [place] [address]     RADIOLOGY STUDIES/RESULTS: CT ANGIO CHEST/ABD/PEL FOR DISSECTION W &/OR WO CONTRAST  Result Date: 10/19/2021 CLINICAL DATA:  Back pain, chest pain and abdominal pain for a few days with fatigue. EXAM: CT ANGIOGRAPHY CHEST, ABDOMEN AND PELVIS TECHNIQUE: Non-contrast CT of the chest was initially obtained. Multidetector CT imaging through the chest, abdomen and pelvis was performed using the standard protocol during bolus administration of intravenous contrast. Multiplanar reconstructed images and MIPs were obtained and reviewed to evaluate the vascular anatomy. CONTRAST:  141mL OMNIPAQUE IOHEXOL 350 MG/ML SOLN COMPARISON:  CT June 17, 2020. FINDINGS: CTA CHEST FINDINGS Cardiovascular: Non contrasted series demonstrates aortic atherosclerosis without intramural hematoma. No thoracic aortic aneurysm or dissection. No central pulmonary embolus on this nondedicated study. Normal size heart. No significant pericardial effusion/thickening. Calcifications of the aortic annulus. Coronary artery calcifications. Mediastinum/Nodes: No pathologically enlarged mediastinal, hilar or axillary lymph nodes. The trachea and esophagus are unremarkable. Lungs/Pleura: No suspicious pulmonary nodules or masses. No pleural effusion. No  pneumothorax. No focal airspace consolidation. Musculoskeletal: Advanced right greater than left glenohumeral degenerative change. Thoracic spondylosis. No acute osseous abnormality. Review of the MIP images confirms the above findings. CTA ABDOMEN AND PELVIS FINDINGS VASCULAR Aorta: Calcified noncalcified atherosclerotic plaque. Normal caliber aorta without aneurysm, dissection, vasculitis or significant stenosis. Celiac: Patent without evidence of aneurysm, dissection, vasculitis or significant stenosis. SMA: Patent without evidence of aneurysm, dissection, vasculitis or significant stenosis. Renals: Both renal arteries are patent without evidence of aneurysm, dissection, vasculitis, fibromuscular dysplasia or significant stenosis. IMA: Patent without evidence of aneurysm, dissection, vasculitis or significant stenosis. Inflow: Patent without evidence of aneurysm, dissection, vasculitis or significant stenosis. Veins: No obvious venous abnormality within the limitations of this arterial phase study. Review of the MIP images confirms the above findings. NON-VASCULAR Hepatobiliary: Diffuse hepatic steatosis. Gallbladder is unremarkable. No biliary ductal dilation. Pancreas: Edematous appearance of the pancreas with peripancreatic fluid, without hypoenhancing areas of the pancreatic parenchyma, walled off fluid collections or pancreatic ductal dilation. Spleen: Within normal  limits. Adrenals/Urinary Tract: Bilateral adrenal glands are unremarkable. No hydronephrosis. 1.7 cm left renal cyst. Additional tiny bilateral hypodense renal lesions are technically too small to accurately characterize but statistically likely reflect cysts. Urinary bladder is unremarkable for degree of distension. Stomach/Bowel: No radiopaque enteric contrast was administered. Stomach is unremarkable for degree of distension. There is inflammation along the first second and third portions of the duodenum favored reactive related to  pancreatitis. No pathologic dilation of small or large bowel. The appendix and terminal ileum appear normal. The colon is predominately decompressed limiting evaluation. Lymphatic: No pathologically enlarged abdominal lymph nodes. Reproductive: Status post hysterectomy. No adnexal masses. Other: No pneumoperitoneum.  No walled off fluid collections. Musculoskeletal: Thoracolumbar spondylosis. Degenerative changes bilateral hips. No acute osseous abnormality. Review of the MIP images confirms the above findings. IMPRESSION: 1. No evidence of aortic aneurysm or dissection. 2. Acute interstitial pancreatitis without evidence of pancreatic necrosis or walled off fluid collections. 3. Inflammatory changes along the first second and third portions of the duodenum favored reactive. 4. Diffuse hepatic steatosis. 5.  Aortic Atherosclerosis (ICD10-I70.0). Electronically Signed   By: Dahlia Bailiff M.D.   On: 10/19/2021 14:34   US Abdomen Limited RUQ (LIVER/GB)  Result Date: 10/19/2021 CLINICAL DATA:  Pancreatitis EXAM: ULTRASOUND ABDOMEN LIMITED RIGHT UPPER QUADRANT COMPARISON:  CT 10/19/2021 FINDINGS: Gallbladder: No gallstones or wall thickening visualized. No sonographic Murphy sign noted by sonographer. Common bile duct: Diameter: 3.5 mm Liver: Slightly echogenic. No focal hepatic abnormality. Portal vein is patent on color Doppler imaging with normal direction of blood flow towards the liver. Other: None. IMPRESSION: 1. Negative for gallstones. 2. Slightly echogenic liver consistent with hepatic steatosis Electronically Signed   By: Donavan Foil M.D.   On: 10/19/2021 20:44     LOS: 1 day   Oren Binet, MD  [facility]    To contact the attending provider between 7A-7P or the covering provider during after hours 7P-7A, please log into the web site [email or web address] and access using universal  password for that web site. If you do not have the password, please call the hospital  operator.  10/20/2021, 12:56 PM

## 2021-10-21 DIAGNOSIS — M059 Rheumatoid arthritis with rheumatoid factor, unspecified: Secondary | ICD-10-CM

## 2021-10-21 DIAGNOSIS — K859 Acute pancreatitis without necrosis or infection, unspecified: Principal | ICD-10-CM

## 2021-10-21 LAB — COMPREHENSIVE METABOLIC PANEL
ALT: 16 U/L (ref 0–44)
AST: 20 U/L (ref 15–41)
Albumin: 3 g/dL — ABNORMAL LOW (ref 3.5–5.0)
Alkaline Phosphatase: 76 U/L (ref 38–126)
Anion gap: 6 (ref 5–15)
BUN: 13 mg/dL (ref 8–23)
CO2: 28 mmol/L (ref 22–32)
Calcium: 10.1 mg/dL (ref 8.9–10.3)
Chloride: 100 mmol/L (ref 98–111)
Creatinine, Ser: 0.89 mg/dL (ref 0.44–1.00)
GFR, Estimated: >60 mL/min (ref >60)
Glucose, Bld: 186 mg/dL — ABNORMAL HIGH (ref 70–99)
Potassium: 3.9 mmol/L (ref 3.5–5.1)
Sodium: 134 mmol/L — ABNORMAL LOW (ref 135–145)
Total Bilirubin: 1.8 mg/dL — ABNORMAL HIGH (ref 0.3–1.2)
Total Protein: 5.5 g/dL — ABNORMAL LOW (ref 6.5–8.1)

## 2021-10-21 LAB — CBC
HCT: 38.7 % (ref 36.0–46.0)
Hemoglobin: 12.5 g/dL (ref 12.0–15.0)
MCH: 28.3 pg (ref 26.0–34.0)
MCHC: 32.3 g/dL (ref 30.0–36.0)
MCV: 87.6 fL (ref 80.0–100.0)
Platelets: 172 10*3/uL (ref 150–400)
RBC: 4.42 MIL/uL (ref 3.87–5.11)
RDW: 14.2 % (ref 11.5–15.5)
WBC: 11.8 10*3/uL — ABNORMAL HIGH (ref 4.0–10.5)
nRBC: 0 % (ref 0.0–0.2)

## 2021-10-21 LAB — GLUCOSE, CAPILLARY
Glucose-Capillary: 174 mg/dL — ABNORMAL HIGH (ref 70–99)
Glucose-Capillary: 198 mg/dL — ABNORMAL HIGH (ref 70–99)
Glucose-Capillary: 179 mg/dL — ABNORMAL HIGH (ref 70–99)
Glucose-Capillary: 165 mg/dL — ABNORMAL HIGH (ref 70–99)

## 2021-10-21 LAB — MAGNESIUM: Magnesium: 1.8 mg/dL (ref 1.7–2.4)

## 2021-10-21 MED ORDER — OXYCODONE HCL 5 MG PO TABS
5.0000 mg | ORAL_TABLET | Freq: Four times a day (QID) | ORAL | Status: DC | PRN
  Administered 2021-10-21 – 2021-10-24 (×9): 5 mg via ORAL
  Filled 2021-10-21 (×10): qty 1

## 2021-10-21 MED ORDER — ENOXAPARIN SODIUM 60 MG/0.6ML IJ SOSY
60.0000 mg | PREFILLED_SYRINGE | INTRAMUSCULAR | Status: DC
  Administered 2021-10-21 – 2021-10-23 (×3): 60 mg via SUBCUTANEOUS
  Filled 2021-10-21 (×3): qty 0.6

## 2021-10-21 MED ORDER — POLYETHYLENE GLYCOL 3350 17 G PO PACK
17.0000 g | PACK | Freq: Every day | ORAL | Status: DC
  Administered 2021-10-21 – 2021-10-23 (×3): 17 g via ORAL
  Filled 2021-10-21 (×3): qty 1

## 2021-10-21 NOTE — Progress Notes (Addendum)
PROGRESS NOTE        PATIENT DETAILS Name: Linda Atkinson Age: 75 y.o. Sex: female Date of Birth: 1946-03-22 Admit Date: 10/19/2021 Admitting Physician Norval Morton, MD TAV:[id number], Damaris Hippo, PA-C  Brief Narrative: Patient is a 75 y.o. female with history of DM-2, HFpEF, hypothyroidism, RA-who presented with approximately 5-day history of abdominal pain, nausea, vomiting-found to have acute pancreatitis.  Subjective: Continues to have some abdominal pain but slightly better than yesterday.  Per patient-pain is radiating to her back as well.  Tolerating clear liquids.  Objective: Vitals: Blood pressure 100/72, pulse 97, temperature 98.4 F (36.9 C), temperature source Oral, resp. rate 19, height 5\' 6"  (1.676 m), weight 118.8 kg, SpO2 94 %.   Exam: Gen Exam:Alert awake-not in any distress HEENT:atraumatic, normocephalic Chest: B/L clear to auscultation anteriorly CVS:S1S2 regular Abdomen: Soft-continues to have upper abdominal tenderness without any peritoneal signs. Extremities:no edema Neurology: Non focal Skin: no rash   Pertinent Labs/Radiology: Recent Labs  Lab 10/19/21 1230 10/20/21 0044 10/21/21 0112  WBC 9.8   < > 11.8*  HGB 13.3   < > 12.5  PLT 211   < > 172  NA 132*   < > 134*  K 3.5   < > 3.9  CREATININE 0.76   < > 0.89  AST 19  --  20  ALT 25  --  16  ALKPHOS 96  --  76  BILITOT 1.0  --  1.8*   < > = values in this interval not displayed.     Assessment/Plan: Acute pancreatitis: Etiology unknown-no history of alcohol use-no gallstones seen on CT abdomen/RUQ ultrasound.  Triglycerides within normal limits.  Could be ?  Lasix (no longer on semaglutide since last December).  Tolerating clear liquids-continue IVF and other supportive care-GI consulted today-May need EUS in the outpatient setting once pancreatitis resolves.  Hypokalemia: Replete and recheck  Chronic diastolic heart failure: Euvolemic on exam-hold Lasix-watch  closely while on IVF acute pancreatitis:  DM-2 (A1c 9.6): Claims she is only on metformin as an outpatient-no longer on semaglutide for almost a year.  CBGs relatively stable-continue SSI 10 units of Semglee-follow and adjust.    Recent Labs    10/20/21 1616 10/20/21 2058 10/21/21 0756  GLUCAP 210* 224* 174*     Hypothyroidism: Continue Synthroid  Rheumatoid arthritis: Does not appear to be on any DMARDs-appears stable  Morbid Obesity Estimated body mass index is 42.29 kg/m as calculated from the following:   Height as of this encounter: 5\' 6"  (1.676 m).   Weight as of this encounter: 118.8 kg.    Procedures: None Consults: GI DVT Prophylaxis: Lovenox Code Status:Full code  Family Communication: None at bedside  Time spent: 25 minutes-Greater than 50% of this time was spent in counseling, explanation of diagnosis, planning of further management, and coordination of care.   Disposition Plan: Status is: Inpatient  Remains inpatient appropriate because: Acute pancreatitis-supportive care in progress-remains on clear liquids-still with a lot of symptoms-not yet stable for discharge.    Diet: Diet Order             Diet clear liquid Room service appropriate? Yes; Fluid consistency: Thin  Diet effective now                     Antimicrobial agents: Anti-infectives (From admission, onward)  None        MEDICATIONS: Scheduled Meds:  enoxaparin (LOVENOX) injection  50 mg Subcutaneous Q24H   insulin aspart  0-15 Units Subcutaneous TID WC & HS   insulin glargine-yfgn  12 Units Subcutaneous Daily   levothyroxine  175 mcg Oral Q0600   pantoprazole (PROTONIX) IV  40 mg Intravenous Q12H   Continuous Infusions:  lactated ringers 125 mL/hr at 10/20/21 1645   PRN Meds:.acetaminophen **OR** acetaminophen, HYDROmorphone (DILAUDID) injection, ondansetron (ZOFRAN) IV   I have personally reviewed following labs and imaging studies  LABORATORY  DATA: CBC: Recent Labs  Lab 10/19/21 1230 10/20/21 0044 10/21/21 0112  WBC 9.8 11.0* 11.8*  NEUTROABS 8.5*  --   --   HGB 13.3 13.0 12.5  HCT 39.2 41.0 38.7  MCV 83.6 86.1 87.6  PLT 211 189 172     Basic Metabolic Panel: Recent Labs  Lab 10/19/21 1230 10/20/21 0044 10/20/21 1200 10/21/21 0112  NA 132* 133*  --  134*  K 3.5 3.3*  --  3.9  CL 97* 100  --  100  CO2 22 26  --  28  GLUCOSE 315* 242*  --  186*  BUN 14 13  --  13  CREATININE 0.76 0.82  --  0.89  CALCIUM 10.3 10.0  --  10.1  MG  --   --  1.8 1.8     GFR: Estimated Creatinine Clearance: 72.8 mL/min (by C-G formula based on SCr of 0.89 mg/dL).  Liver Function Tests: Recent Labs  Lab 10/19/21 1230 10/21/21 0112  AST 19 20  ALT 25 16  ALKPHOS 96 76  BILITOT 1.0 1.8*  PROT 6.7 5.5*  ALBUMIN 4.0 3.0*    Recent Labs  Lab 10/19/21 1230  LIPASE 1,277*    No results for input(s): AMMONIA in the last 168 hours.  Coagulation Profile: No results for input(s): INR, PROTIME in the last 168 hours.  Cardiac Enzymes: No results for input(s): CKTOTAL, CKMB, CKMBINDEX, TROPONINI in the last 168 hours.  BNP (last 3 results) No results for input(s): PROBNP in the last 8760 hours.  Lipid Profile: Recent Labs    10/19/21 1514 10/20/21 0044  CHOL  --  151  HDL  --  42  LDLCALC  --  86  TRIG 101 115  CHOLHDL  --  3.6     Thyroid Function Tests: No results for input(s): TSH, T4TOTAL, FREET4, T3FREE, THYROIDAB in the last 72 hours.  Anemia Panel: No results for input(s): VITAMINB12, FOLATE, FERRITIN, TIBC, IRON, RETICCTPCT in the last 72 hours.  Urine analysis:    Component Value Date/Time   COLORURINE YELLOW 10/19/2021 1300   APPEARANCEUR CLEAR 10/19/2021 1300   LABSPEC 1.020 10/19/2021 1300   PHURINE 5.0 10/19/2021 1300   GLUCOSEU >=500 (A) 10/19/2021 1300   HGBUR NEGATIVE 10/19/2021 1300   BILIRUBINUR NEGATIVE 10/19/2021 1300   KETONESUR >=80 (A) 10/19/2021 1300   PROTEINUR NEGATIVE  10/19/2021 1300   NITRITE NEGATIVE 10/19/2021 1300   LEUKOCYTESUR NEGATIVE 10/19/2021 1300    Sepsis Labs: Lactic Acid, Venous    Component Value Date/Time   LATICACIDVEN 1.4 10/20/2021 0044    MICROBIOLOGY: Recent Results (from the past 240 hour(s))  Resp Panel by RT-PCR (Flu A&B, Covid) Nasopharyngeal Swab     Status: None   Collection Time: 10/19/21  2:55 PM   Specimen: Nasopharyngeal Swab; Nasopharyngeal(NP) swabs in vial transport medium  Result Value Ref Range Status   SARS Coronavirus 2 by RT PCR NEGATIVE NEGATIVE  Final    Comment: (NOTE) SARS-CoV-2 target nucleic acids are NOT DETECTED.  The SARS-CoV-2 RNA is generally detectable in upper respiratory specimens during the acute phase of infection. The lowest concentration of SARS-CoV-2 viral copies this assay can detect is 138 copies/mL. A negative result does not preclude SARS-Cov-2 infection and should not be used as the sole basis for treatment or other patient management decisions. A negative result may occur with  improper specimen collection/handling, submission of specimen other than nasopharyngeal swab, presence of viral mutation(s) within the areas targeted by this assay, and inadequate number of viral copies(<138 copies/mL). A negative result must be combined with clinical observations, patient history, and epidemiological information. The expected result is Negative.  Fact Sheet for Patients:  EntrepreneurPulse.com.au  Fact Sheet for Healthcare Providers:  IncredibleEmployment.be  This test is no t yet approved or cleared by the [place] FDA and  has been authorized for detection and/or diagnosis of SARS-CoV-2 by FDA under an Emergency Use Authorization (EUA). This EUA will remain  in effect (meaning this test can be used) for the duration of the COVID-19 declaration under Section 564(b)(1) of the Act, 21 U.S.C.section 360bbb-3(b)(1), unless the authorization is  terminated  or revoked sooner.       Influenza A by PCR NEGATIVE NEGATIVE Final   Influenza B by PCR NEGATIVE NEGATIVE Final    Comment: (NOTE) The Xpert Xpress SARS-CoV-2/FLU/RSV plus assay is intended as an aid in the diagnosis of influenza from Nasopharyngeal swab specimens and should not be used as a sole basis for treatment. Nasal washings and aspirates are unacceptable for Xpert Xpress SARS-CoV-2/FLU/RSV testing.  Fact Sheet for Patients: EntrepreneurPulse.com.au  Fact Sheet for Healthcare Providers: IncredibleEmployment.be  This test is not yet approved or cleared by the [place] FDA and has been authorized for detection and/or diagnosis of SARS-CoV-2 by FDA under an Emergency Use Authorization (EUA). This EUA will remain in effect (meaning this test can be used) for the duration of the COVID-19 declaration under Section 564(b)(1) of the Act, 21 U.S.C. section 360bbb-3(b)(1), unless the authorization is terminated or revoked.  Performed at [facility], [address] [place], [place] [address]     RADIOLOGY STUDIES/RESULTS: DG Chest Port 1V same Day  Result Date: 10/20/2021 CLINICAL DATA:  Dyspnea, chest pain EXAM: PORTABLE CHEST 1 VIEW COMPARISON:  06/10/2020 FINDINGS: Lung volumes are small. Minimal left basilar atelectasis. Right suprahilar opacity likely represents vascular shadow on this semi-erect examination. No pneumothorax or pleural effusion. Cardiac size within normal limits. No acute bone abnormality. Degenerative changes are noted within the right shoulder. IMPRESSION: Right suprahilar opacity likely represents vascular shadow. This could be confirmed with dedicated two view chest radiograph. No radiographic evidence of acute cardiopulmonary disease. Electronically Signed   By: Fidela Salisbury M.D.   On: 10/20/2021 20:05   CT ANGIO CHEST/ABD/PEL FOR DISSECTION W &/OR WO CONTRAST  Result Date:  10/19/2021 CLINICAL DATA:  Back pain, chest pain and abdominal pain for a few days with fatigue. EXAM: CT ANGIOGRAPHY CHEST, ABDOMEN AND PELVIS TECHNIQUE: Non-contrast CT of the chest was initially obtained. Multidetector CT imaging through the chest, abdomen and pelvis was performed using the standard protocol during bolus administration of intravenous contrast. Multiplanar reconstructed images and MIPs were obtained and reviewed to evaluate the vascular anatomy. CONTRAST:  120mL OMNIPAQUE IOHEXOL 350 MG/ML SOLN COMPARISON:  CT June 17, 2020. FINDINGS: CTA CHEST FINDINGS Cardiovascular: Non contrasted series demonstrates aortic atherosclerosis without intramural hematoma. No thoracic aortic  aneurysm or dissection. No central pulmonary embolus on this nondedicated study. Normal size heart. No significant pericardial effusion/thickening. Calcifications of the aortic annulus. Coronary artery calcifications. Mediastinum/Nodes: No pathologically enlarged mediastinal, hilar or axillary lymph nodes. The trachea and esophagus are unremarkable. Lungs/Pleura: No suspicious pulmonary nodules or masses. No pleural effusion. No pneumothorax. No focal airspace consolidation. Musculoskeletal: Advanced right greater than left glenohumeral degenerative change. Thoracic spondylosis. No acute osseous abnormality. Review of the MIP images confirms the above findings. CTA ABDOMEN AND PELVIS FINDINGS VASCULAR Aorta: Calcified noncalcified atherosclerotic plaque. Normal caliber aorta without aneurysm, dissection, vasculitis or significant stenosis. Celiac: Patent without evidence of aneurysm, dissection, vasculitis or significant stenosis. SMA: Patent without evidence of aneurysm, dissection, vasculitis or significant stenosis. Renals: Both renal arteries are patent without evidence of aneurysm, dissection, vasculitis, fibromuscular dysplasia or significant stenosis. IMA: Patent without evidence of aneurysm, dissection, vasculitis or  significant stenosis. Inflow: Patent without evidence of aneurysm, dissection, vasculitis or significant stenosis. Veins: No obvious venous abnormality within the limitations of this arterial phase study. Review of the MIP images confirms the above findings. NON-VASCULAR Hepatobiliary: Diffuse hepatic steatosis. Gallbladder is unremarkable. No biliary ductal dilation. Pancreas: Edematous appearance of the pancreas with peripancreatic fluid, without hypoenhancing areas of the pancreatic parenchyma, walled off fluid collections or pancreatic ductal dilation. Spleen: Within normal limits. Adrenals/Urinary Tract: Bilateral adrenal glands are unremarkable. No hydronephrosis. 1.7 cm left renal cyst. Additional tiny bilateral hypodense renal lesions are technically too small to accurately characterize but statistically likely reflect cysts. Urinary bladder is unremarkable for degree of distension. Stomach/Bowel: No radiopaque enteric contrast was administered. Stomach is unremarkable for degree of distension. There is inflammation along the first second and third portions of the duodenum favored reactive related to pancreatitis. No pathologic dilation of small or large bowel. The appendix and terminal ileum appear normal. The colon is predominately decompressed limiting evaluation. Lymphatic: No pathologically enlarged abdominal lymph nodes. Reproductive: Status post hysterectomy. No adnexal masses. Other: No pneumoperitoneum.  No walled off fluid collections. Musculoskeletal: Thoracolumbar spondylosis. Degenerative changes bilateral hips. No acute osseous abnormality. Review of the MIP images confirms the above findings. IMPRESSION: 1. No evidence of aortic aneurysm or dissection. 2. Acute interstitial pancreatitis without evidence of pancreatic necrosis or walled off fluid collections. 3. Inflammatory changes along the first second and third portions of the duodenum favored reactive. 4. Diffuse hepatic steatosis. 5.   Aortic Atherosclerosis (ICD10-I70.0). Electronically Signed   By: Dahlia Bailiff M.D.   On: 10/19/2021 14:34   US Abdomen Limited RUQ (LIVER/GB)  Result Date: 10/19/2021 CLINICAL DATA:  Pancreatitis EXAM: ULTRASOUND ABDOMEN LIMITED RIGHT UPPER QUADRANT COMPARISON:  CT 10/19/2021 FINDINGS: Gallbladder: No gallstones or wall thickening visualized. No sonographic Murphy sign noted by sonographer. Common bile duct: Diameter: 3.5 mm Liver: Slightly echogenic. No focal hepatic abnormality. Portal vein is patent on color Doppler imaging with normal direction of blood flow towards the liver. Other: None. IMPRESSION: 1. Negative for gallstones. 2. Slightly echogenic liver consistent with hepatic steatosis Electronically Signed   By: Donavan Foil M.D.   On: 10/19/2021 20:44     LOS: 2 days   Oren Binet, MD  [facility]    To contact the attending provider between 7A-7P or the covering provider during after hours 7P-7A, please log into the web site [email or web address] and access using universal [place] password for that web site. If you do not have the password, please call the hospital operator.  10/21/2021, 10:13 AM

## 2021-10-21 NOTE — Consult Note (Signed)
[facility] Consult: 10:09 AM 10/21/2021  LOS: 2 days    Referring Provider: Dr Sloan Leiter  Primary Care Physician:  Mancel Bale, PA-C  [facility] Primary Gastroenterologist:     unassigned    Reason for Consultation:  Pancreatitis.     HPI: Linda Atkinson is a 75 y.o. female.  See listed PMH below.  Obesity.  NIDDM.  Rheumatoid arthritis.  GERD controlled with famotidine.  Hypothyroidism.  Anxiety. No previous colonoscopy or EGD.    Takes metformin for her diabetes.  Only newe med is a cholesterol med that starts w an E (ezetamibe?) but is not listed on PTA med list  Starting last Sunday she had mild abdominal pain.  This progressed throughout the week.  There was some nausea but no emesis.  Lost her appetite.  Describes pain as worse in the mid to lower abdomen, some in the left upper quadrant, and substernal.  The pain has now relocated into her thoracic back area. Now admitted with acute pancreatitis of unclear etiology.  Vital signs with occasional hypertension, no tachycardia.  Sats in mid 90% on 2 L oxygen. Lipase 1277.  T bili 1.8.  Normal alk phos and transaminases.  No renal dysfunction.  WBCs 11.8.  Hb 12.5. Nominal ultrasound with no gallstones.  Echogenic/fatty liver.  CT shows acute pancreatitis without necrosis or fluid collections.  Likely reactive inflammation in duodenum.  Diffuse hepatic steatosis.  Current pain management with Dilaudid, got 4 mg yesterday. I's/O's yesterday +2.1 L.  Denies family history of pancreatitis, gallbladder disease, ulcers, colon cancer. Works as a Dietitian.  No alcohol now or ever.  Lives in [place], currently her son is living with her.  She is widowed.    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 26 2013: "blood pressure dropped"    Depression    moderate   Diabetes ([place])    GERD (gastroesophageal reflux disease) 1996   hx of   Hypothyroidism    IBS (irritable bowel syndrome)    Joint pain    Pneumonia 2009   hx of   Pre-diabetes    Rheumatoid arthritis ([place])    Shortness of breath    with walking   SOB (shortness of breath)    Thyroid disease    Vitamin D deficiency     Past Surgical History:  Procedure Laterality Date   ABDOMINAL HYSTERECTOMY  1977   cataract surgery Bilateral 10/2015   cyst removed Left 1962   knee   DILATION AND CURETTAGE OF UTERUS  1972   PARATHYROIDECTOMY Right 07/13/2013   Procedure: RIGHT INFERIOR PARATHYROIDECTOMY;  Surgeon: Ralene Ok, MD;  Location: [facility];  Service: General;  Laterality: Right;   right shoulder rotater cuff Right 2013   x2    Prior to Admission medications  Medication Sig Start Date End Date Taking? Authorizing Provider  famotidine (PEPCID) 20 MG tablet  04/06/21  Yes [provider]  furosemide (LASIX) 20 MG tablet TAKE 1/2 TABLET(10 MG) BY MOUTH DAILY 03/26/21  Yes Freddi Starr, MD  metFORMIN (GLUCOPHAGE) 500 MG tablet Take 500 mg by mouth 2 (two) times daily with a meal.   Yes [provider]  naproxen sodium (ALEVE) 220 MG tablet Take 220 mg by mouth as needed.   Yes [provider]  SYNTHROID 175 MCG tablet Take 175 mcg by mouth daily. 10/28/19  Yes [provider]  Vitamin D, Ergocalciferol, (DRISDOL) 1.25 MG (50000 UNIT) CAPS capsule Take 50,000 Units by mouth once a week. 01/18/20  Yes [provider]  Cyanocobalamin (VITAMIN B-12) 2500 MCG SUBL Take 1 tablet by mouth every other day. 05/30/20   [provider]    Scheduled Meds:  enoxaparin (LOVENOX) injection  50 mg Subcutaneous Q24H   insulin aspart  0-15 Units Subcutaneous TID WC & HS    insulin glargine-yfgn  12 Units Subcutaneous Daily   levothyroxine  175 mcg Oral Q0600   pantoprazole (PROTONIX) IV  40 mg Intravenous Q12H   Infusions:  lactated ringers 125 mL/hr at 10/20/21 1645   PRN Meds: acetaminophen **OR** acetaminophen, HYDROmorphone (DILAUDID) injection, ondansetron (ZOFRAN) IV   Allergies as of 10/19/2021 - Review Complete 10/19/2021  Allergen Reaction Noted   Lipitor [atorvastatin] Other (See Comments) 06/17/2014    Family History  Problem Relation Age of Onset   Heart disease Father    Diabetes Father    Hyperlipidemia Father    Kidney disease Father    Alzheimer's disease Mother    High blood pressure Mother    Thyroid disease Mother    Cancer Brother        prostate   Alzheimer's disease Brother    Cancer Maternal Aunt        breast   Cancer Paternal Aunt        uterine   Cancer Paternal Grandmother        colon   Healthy Son     Social History   Socioeconomic History   Marital status: Widowed    Spouse name: Not on file   Number of children: 1   Years of education: Not on file   Highest education level: Not on file  Occupational History   Occupation: self employed court reorter  Tobacco Use   Smoking status: Former    Packs/day: 0.50    Years: 50.00    Pack years: 25.00    Types: Cigarettes    Quit date: 02/16/2020    Years since quitting: 1.6   Smokeless tobacco: Never  Vaping Use   Vaping Use: Never used  Substance and Sexual Activity   Alcohol use: No    Alcohol/week: 0.0 standard drinks   Drug use: No   Sexual activity: Not on file  Other Topics Concern   Not on file  Social History Narrative   Lives alone         Eating mainly raw foods   walks 5 miles  day   Social Determinants of Health   Financial Resource Strain: Not on file  Food Insecurity: Not on file  Transportation Needs: Not on file  Physical Activity: Not on file  Stress: Not on file  Social Connections: Not on file  Intimate Partner  Violence: Not on file    REVIEW OF SYSTEMS: Constitutional: Tired ENT:  No  nose bleeds Pulm: No shortness of breath but pain can be worse if she takes a deep breath. CV:  No palpitations, no LE edema.  No angina GU:  No hematuria, no frequency GI: See HPI. Heme: No unusual bleeding or bruising. Transfusions: None Neuro:  No headaches, no peripheral tingling or numbness Derm:  No itching, no rash or sores.  Endocrine:  No sweats or chills.  No polyuria or dysuria Immunization: Reviewed. Travel: Traveled to [place] for work earlier this week   PHYSICAL EXAM: Vital signs in last 24 hours: Vitals:   10/21/21 0739 10/21/21 0758  BP: 128/69 100/72  Pulse: 96 97  Resp: (!) 22 19  Temp:  98.4 F (36.9 C)  SpO2: 93% 94%   Wt Readings from Last 3 Encounters:  10/20/21 118.8 kg  05/09/21 119.3 kg  04/19/21 119.3 kg    General: Obese, looks chronically unwell.  Currently comfortable and not toxic appearing. Head: No facial asymmetry.  No signs of head trauma.  No facial edema Eyes: No scleral icterus or conjunctival pallor. Ears: No hearing deficit Nose: No congestion or discharge Mouth: Oral mucosa moist, pink, clear.  Tongue midline.  Fair dentition. Neck: No JVD, no masses, no thyromegaly Lungs: Clear bilaterally without labored breathing or cough. Heart: RRR.  No MRG.  S1, S2 present Abdomen: Soft.  Tender without guarding in the left upper quadrant.  No HSM, masses, bruits, hernias.  Active bowel sounds..   Rectal: Deferred Musc/Skeltl: No joint redness or swelling. Extremities: No CCE Neurologic: Oriented x3.  Moves all 4 limbs without tremor or weakness. Skin: No telangiectasia, rash, sores, suspicious lesions. Nodes: No cervical adenopathy Psych: Anxious, cooperative, fluid speech.   Intake/Output from previous day: 12/16 0701 - 12/17 0700 In: 2129.4 [I.V.:2129.4] Out: -  Intake/Output this shift: No intake/output data recorded.  LAB RESULTS: Recent Labs     10/19/21 1230 10/20/21 0044 10/21/21 0112  WBC 9.8 11.0* 11.8*  HGB 13.3 13.0 12.5  HCT 39.2 41.0 38.7  PLT 211 189 172   BMET Lab Results  Component Value Date   NA 134 (L) 10/21/2021   NA 133 (L) 10/20/2021   NA 132 (L) 10/19/2021   K 3.9 10/21/2021   K 3.3 (L) 10/20/2021   K 3.5 10/19/2021   CL 100 10/21/2021   CL 100 10/20/2021   CL 97 (L) 10/19/2021   CO2 28 10/21/2021   CO2 26 10/20/2021   CO2 22 10/19/2021   GLUCOSE 186 (H) 10/21/2021   GLUCOSE 242 (H) 10/20/2021   GLUCOSE 315 (H) 10/19/2021   BUN 13 10/21/2021   BUN 13 10/20/2021   BUN 14 10/19/2021   CREATININE 0.89 10/21/2021   CREATININE 0.82 10/20/2021   CREATININE 0.76 10/19/2021   CALCIUM 10.1 10/21/2021   CALCIUM 10.0 10/20/2021   CALCIUM 10.3 10/19/2021   LFT Recent Labs    10/19/21 1230 10/21/21 0112  PROT 6.7 5.5*  ALBUMIN 4.0 3.0*  AST 19 20  ALT 25 16  ALKPHOS 96 76  BILITOT 1.0 1.8*   PT/INR No results found for: INR, PROTIME Hepatitis Panel No results for input(s): HEPBSAG, HCVAB, HEPAIGM, HEPBIGM in the last 72 hours. C-Diff No components found for: CDIFF Lipase     Component Value Date/Time   LIPASE 1,277 (H) 10/19/2021 1230    Drugs of Abuse  No results found for: LABOPIA, COCAINSCRNUR, LABBENZ, AMPHETMU, THCU, LABBARB   RADIOLOGY STUDIES: DG Chest Port 1V same Day  Result Date: 10/20/2021 CLINICAL DATA:  Dyspnea, chest pain EXAM: PORTABLE CHEST 1 VIEW COMPARISON:  06/10/2020 FINDINGS: Lung volumes are small. Minimal left basilar atelectasis. Right suprahilar opacity likely represents vascular shadow on this semi-erect examination. No pneumothorax or pleural effusion. Cardiac size within normal limits. No acute bone abnormality. Degenerative changes are noted within the right shoulder. IMPRESSION: Right suprahilar opacity likely represents vascular shadow. This could be confirmed with dedicated two view chest radiograph. No radiographic evidence of acute cardiopulmonary  disease. Electronically Signed   By: Fidela Salisbury M.D.   On: 10/20/2021 20:05   CT ANGIO CHEST/ABD/PEL FOR DISSECTION W &/OR WO CONTRAST  Result Date: 10/19/2021 CLINICAL DATA:  Back pain, chest pain and abdominal pain for a few days with fatigue. EXAM: CT ANGIOGRAPHY CHEST, ABDOMEN AND PELVIS TECHNIQUE: Non-contrast CT of the chest was initially obtained. Multidetector CT imaging through the chest, abdomen and pelvis was performed using the standard protocol during bolus administration of intravenous contrast. Multiplanar reconstructed images and MIPs were obtained and reviewed to evaluate the vascular anatomy. CONTRAST:  168m OMNIPAQUE IOHEXOL 350 MG/ML SOLN COMPARISON:  CT June 17, 2020. FINDINGS: CTA CHEST FINDINGS Cardiovascular: Non contrasted series demonstrates aortic atherosclerosis without intramural hematoma. No thoracic aortic aneurysm or dissection. No central pulmonary embolus on this nondedicated study. Normal size heart. No significant pericardial effusion/thickening. Calcifications of the aortic annulus. Coronary artery calcifications. Mediastinum/Nodes: No pathologically enlarged mediastinal, hilar or axillary lymph nodes. The trachea and esophagus are unremarkable. Lungs/Pleura: No suspicious pulmonary nodules or masses. No pleural effusion. No pneumothorax. No focal airspace consolidation. Musculoskeletal: Advanced right greater than left glenohumeral degenerative change. Thoracic spondylosis. No acute osseous abnormality. Review of the MIP images confirms the above findings. CTA ABDOMEN AND PELVIS FINDINGS VASCULAR Aorta: Calcified noncalcified atherosclerotic plaque. Normal caliber aorta without aneurysm, dissection, vasculitis or significant stenosis. Celiac: Patent without evidence of aneurysm, dissection, vasculitis or significant stenosis. SMA: Patent without evidence of aneurysm, dissection, vasculitis or significant stenosis. Renals: Both renal arteries are patent without  evidence of aneurysm, dissection, vasculitis, fibromuscular dysplasia or significant stenosis. IMA: Patent without evidence of aneurysm, dissection, vasculitis or significant stenosis. Inflow: Patent without evidence of aneurysm, dissection, vasculitis or significant stenosis. Veins: No obvious venous abnormality within the limitations of this arterial phase study. Review of the MIP images confirms the above findings. NON-VASCULAR Hepatobiliary: Diffuse hepatic steatosis. Gallbladder is unremarkable. No biliary ductal dilation. Pancreas: Edematous appearance of the pancreas with peripancreatic fluid, without hypoenhancing areas of the pancreatic parenchyma, walled off fluid collections or pancreatic ductal dilation. Spleen: Within normal limits. Adrenals/Urinary Tract: Bilateral adrenal glands are unremarkable. No hydronephrosis. 1.7 cm left renal cyst. Additional tiny bilateral hypodense renal lesions are technically too small to accurately characterize but statistically likely reflect cysts. Urinary bladder is unremarkable for degree of distension. Stomach/Bowel: No radiopaque enteric contrast was administered. Stomach is unremarkable for degree of distension. There is inflammation along the first second and third portions of the duodenum favored reactive related to pancreatitis. No pathologic dilation of small or large bowel. The appendix and terminal ileum appear normal. The colon is predominately decompressed limiting evaluation. Lymphatic: No pathologically enlarged abdominal lymph nodes. Reproductive: Status post hysterectomy. No adnexal masses. Other: No pneumoperitoneum.  No walled off fluid collections. Musculoskeletal: Thoracolumbar spondylosis. Degenerative changes bilateral hips. No acute osseous abnormality. Review of the MIP images confirms the above findings. IMPRESSION: 1. No evidence of aortic aneurysm or dissection. 2. Acute interstitial pancreatitis without evidence of pancreatic necrosis or  walled off fluid collections. 3. Inflammatory changes along the first  second and third portions of the duodenum favored reactive. 4. Diffuse hepatic steatosis. 5.  Aortic Atherosclerosis (ICD10-I70.0). Electronically Signed   By: Dahlia Bailiff M.D.   On: 10/19/2021 14:34   US Abdomen Limited RUQ (LIVER/GB)  Result Date: 10/19/2021 CLINICAL DATA:  Pancreatitis EXAM: ULTRASOUND ABDOMEN LIMITED RIGHT UPPER QUADRANT COMPARISON:  CT 10/19/2021 FINDINGS: Gallbladder: No gallstones or wall thickening visualized. No sonographic Murphy sign noted by sonographer. Common bile duct: Diameter: 3.5 mm Liver: Slightly echogenic. No focal hepatic abnormality. Portal vein is patent on color Doppler imaging with normal direction of blood flow towards the liver. Other: None. IMPRESSION: 1. Negative for gallstones. 2. Slightly echogenic liver consistent with hepatic steatosis Electronically Signed   By: Donavan Foil M.D.   On: 10/19/2021 20:44     IMPRESSION:   Acute pancreatitis with reactive duodenal inflammation.  Etiology thus far unclear.  Could be autoimmune, could be medication induced.  Currently hemodynamically stable  Fatty liver in patient with diabetes, obesity.  Rheumatoid arthritis     NIDDM    PLAN:       IgG4.  Continue clear liquids and advance slowly to carb modified diet as tolerated.  May need MRCP but probably best to wait until acute pancreatitis resolved. Recheck c-Met and lipase tomorrow   Azucena Freed  10/21/2021, 10:09 AM Phone [phone]

## 2021-10-22 DIAGNOSIS — K8500 Idiopathic acute pancreatitis without necrosis or infection: Secondary | ICD-10-CM

## 2021-10-22 LAB — COMPREHENSIVE METABOLIC PANEL
ALT: 16 U/L (ref 0–44)
AST: 17 U/L (ref 15–41)
Albumin: 2.5 g/dL — ABNORMAL LOW (ref 3.5–5.0)
Alkaline Phosphatase: 78 U/L (ref 38–126)
Anion gap: 7 (ref 5–15)
BUN: 8 mg/dL (ref 8–23)
CO2: 25 mmol/L (ref 22–32)
Calcium: 9.9 mg/dL (ref 8.9–10.3)
Chloride: 101 mmol/L (ref 98–111)
Creatinine, Ser: 0.65 mg/dL (ref 0.44–1.00)
GFR, Estimated: >60 mL/min (ref >60)
Glucose, Bld: 170 mg/dL — ABNORMAL HIGH (ref 70–99)
Potassium: 3.4 mmol/L — ABNORMAL LOW (ref 3.5–5.1)
Sodium: 133 mmol/L — ABNORMAL LOW (ref 135–145)
Total Bilirubin: 1.8 mg/dL — ABNORMAL HIGH (ref 0.3–1.2)
Total Protein: 5 g/dL — ABNORMAL LOW (ref 6.5–8.1)

## 2021-10-22 LAB — CBC
HCT: 35.4 % — ABNORMAL LOW (ref 36.0–46.0)
Hemoglobin: 11.1 g/dL — ABNORMAL LOW (ref 12.0–15.0)
MCH: 27.3 pg (ref 26.0–34.0)
MCHC: 31.4 g/dL (ref 30.0–36.0)
MCV: 87.2 fL (ref 80.0–100.0)
Platelets: 149 10*3/uL — ABNORMAL LOW (ref 150–400)
RBC: 4.06 MIL/uL (ref 3.87–5.11)
RDW: 14 % (ref 11.5–15.5)
WBC: 7.8 10*3/uL (ref 4.0–10.5)
nRBC: 0 % (ref 0.0–0.2)

## 2021-10-22 LAB — GLUCOSE, CAPILLARY
Glucose-Capillary: 163 mg/dL — ABNORMAL HIGH (ref 70–99)
Glucose-Capillary: 157 mg/dL — ABNORMAL HIGH (ref 70–99)
Glucose-Capillary: 136 mg/dL — ABNORMAL HIGH (ref 70–99)
Glucose-Capillary: 170 mg/dL — ABNORMAL HIGH (ref 70–99)

## 2021-10-22 LAB — LIPASE, BLOOD: Lipase: 54 U/L — ABNORMAL HIGH (ref 11–51)

## 2021-10-22 MED ORDER — MAGNESIUM SULFATE IN D5W 1-5 GM/100ML-% IV SOLN
1.0000 g | Freq: Once | INTRAVENOUS | Status: AC
  Administered 2021-10-22: 09:00:00 1 g via INTRAVENOUS
  Filled 2021-10-22: qty 100

## 2021-10-22 MED ORDER — POTASSIUM CHLORIDE CRYS ER 20 MEQ PO TBCR
40.0000 meq | EXTENDED_RELEASE_TABLET | Freq: Once | ORAL | Status: AC
  Administered 2021-10-22: 09:00:00 40 meq via ORAL
  Filled 2021-10-22: qty 2

## 2021-10-22 NOTE — Evaluation (Addendum)
Occupational Therapy Evaluation Patient Details Name: Linda Atkinson MRN: [id number] DOB: 07/13/1946 Today's Date: 10/22/2021   History of Present Illness Patient is a 75 y.o. female with history of DM-2, HFpEF, hypothyroidism, RA-who presented with approximately 5-day history of abdominal pain, nausea, vomiting-found to have acute pancreatitis.   Clinical Impression   PTA, pt was living at home alone she reports her son recently started staying with her. Pt reports she was independent with ADL/IADL and functional mobility. Pt was working and driving. Pt received in bed, agreeable to OT session. She currently demonstrates ability to complete ADL at modified independent level and requires minguard for functional mobility due to instability, pt with 1x loss of balance during session. Pt on 3lnc upon arrival, SpO2 97% at rest. Pt SpO2 RA 97% at rest, pt desated to 89% RA with 74min functional mobility in the room, she was able to rebound to 94% with seated rest break and pursed lip breathing. Pt noted to have audible wheezing at rest and with ambulation. Due to decline in current level of function, pt would benefit from acute OT to address established goals to facilitate safe D/C to venue listed below. Anticipate pt will return to prior level functioning when she recovers medically, no follow-up recommended. Will continue to follow acutely to address energy conservation strategies, activity tolerance, and overall independence with self-care.        Recommendations for follow up therapy are one component of a multi-disciplinary discharge planning process, led by the attending physician.  Recommendations may be updated based on patient status, additional functional criteria and insurance authorization.   Follow Up Recommendations  No OT follow up    Assistance Recommended at Discharge    Functional Status Assessment  Patient has had a recent decline in their functional status and demonstrates the  ability to make significant improvements in function in a reasonable and predictable amount of time.  Equipment Recommendations  Tub/shower seat    Recommendations for Other Services PT consult     Precautions / Restrictions Precautions Precautions: Fall Restrictions Weight Bearing Restrictions: No      Mobility Bed Mobility Overal bed mobility: Independent             General bed mobility comments: supin<>sit flat bed    Transfers Overall transfer level: Modified independent                        Balance Overall balance assessment: Needs assistance Sitting-balance support: No upper extremity supported;Feet supported Sitting balance-Leahy Scale: Normal     Standing balance support: No upper extremity supported;During functional activity Standing balance-Leahy Scale: Good Standing balance comment: noted pt to have 1x loss of balance during ambulation in the room. pt was able to self-correct.                           ADL either performed or assessed with clinical judgement   ADL Overall ADL's : Needs assistance/impaired Eating/Feeding: Independent   Grooming: Supervision/safety;Standing   Upper Body Bathing: Independent   Lower Body Bathing: Sit to/from stand;Modified independent   Upper Body Dressing : Independent   Lower Body Dressing: Sit to/from stand;Modified independent Lower Body Dressing Details (indicate cue type and reason): donned socks sitting EOB Toilet Transfer: Min guard;Ambulation   Toileting- Clothing Manipulation and Hygiene: Min guard;Sit to/from stand       Functional mobility during ADLs: Min guard General ADL Comments: pt limited by  cardiopulmonary limitations, decreased activity tolerance, instability     Vision Baseline Vision/History: 1 Wears glasses Ability to See in Adequate Light: 0 Adequate Patient Visual Report: No change from baseline       Perception     Praxis      Pertinent Vitals/Pain  Pain Assessment: 0-10 Pain Score: 1  Breathing: occasional labored breathing, short period of hyperventilation Negative Vocalization: none Facial Expression: smiling or inexpressive Body Language: relaxed Consolability: no need to console PAINAD Score: 1 Facial Expression: Relaxed, neutral Pain Location: LLQ of abdomen is "uncomfortable" Pain Descriptors / Indicators: Discomfort Pain Intervention(s): Limited activity within patient's tolerance;Monitored during session     Hand Dominance Right   Extremity/Trunk Assessment Upper Extremity Assessment Upper Extremity Assessment: Overall WFL for tasks assessed   Lower Extremity Assessment Lower Extremity Assessment: Overall WFL for tasks assessed   Cervical / Trunk Assessment Cervical / Trunk Assessment: Normal   Communication Communication Communication: No difficulties   Cognition Arousal/Alertness: Awake/alert Behavior During Therapy: WFL for tasks assessed/performed Overall Cognitive Status: Within Functional Limits for tasks assessed                                       General Comments  SpO2 89% RA following mobility in the room and ADL. pt able to return SpO2 >94% RA with pursed lip breathing and seated rest break. SpO2 97% at rest with 3lnc, 96% at rest RA.    Exercises Exercises: Other exercises Other Exercises Other Exercises: edcuated pt on ECS to implement in the home environment   Shoulder Instructions      Home Living Family/patient expects to be discharged to:: Private residence Living Arrangements: Alone Available Help at Discharge: Family;Available PRN/intermittently Type of Home: Other(Comment) (condo) Home Access: Level entry     Home Layout: One level     Bathroom Shower/Tub: Walk-in shower;Tub/shower unit   Bathroom Toilet: Standard Bathroom Accessibility: Yes How Accessible: Accessible via walker     Additional Comments: son has recently started staying with the pt.       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 not on oxygen        OT Problem List: Decreased activity tolerance;Impaired balance (sitting and/or standing);Cardiopulmonary status limiting activity      OT Treatment/Interventions: Self-care/ADL training;Therapeutic exercise;Energy conservation;DME and/or AE instruction;Therapeutic activities;Patient/family education;Balance training    OT Goals(Current goals can be found in the care plan section) Acute Rehab OT Goals Patient Stated Goal: to go home OT Goal Formulation: With patient Time For Goal Achievement: 11/05/21 Potential to Achieve Goals: Good ADL Goals Pt Will Perform Lower Body Dressing: Independently;sit to/from stand Pt Will Transfer to Toilet: Independently;ambulating Additional ADL Goal #1: Pt will demonstrate independence wtih 3 energy conservation strategies during ADL/IADL and functional mobility. Additional ADL Goal #2: Pt will tolerate OOB ADL/IADL for 22min with stable SpO2 and use of appropriate rest breaks.  OT Frequency: Min 2X/week   Barriers to D/C:            Co-evaluation              AM-PAC OT "6 Clicks" Daily Activity     Outcome Measure Help from another person  eating meals?: None Help from another person taking care of personal grooming?: A Little Help from another person toileting, which includes using toliet, bedpan, or urinal?: A Little Help from another person bathing (including washing, rinsing, drying)?: A Little Help from another person to put on and taking off regular upper body clothing?: None Help from another person to put on and taking off regular lower body clothing?: A Little 6 Click Score: 20   End of Session  Equipment Utilized During Treatment: Oxygen Nurse Communication: Mobility status  Activity Tolerance: Patient tolerated treatment well Patient left: in bed;with call bell/phone within reach;with bed alarm set  OT Visit Diagnosis: Other abnormalities of gait and mobility (R26.89)                Time: 7482-7078 OT Time Calculation (min): 34 min Charges:  OT General Charges $OT Visit: 1 Visit OT Evaluation $OT Eval Moderate Complexity: 1 Mod OT Treatments $Self Care/Home Management : 8-22 mins  Helene Kelp OTR/L Acute Rehabilitation Services Office: [place] 10/22/2021, 11:00 AM

## 2021-10-22 NOTE — Progress Notes (Signed)
PROGRESS NOTE        PATIENT DETAILS Name: Linda Atkinson Age: 75 y.o. Sex: female Date of Birth: [birth year] Admit Date: 10/19/2021 Admitting Physician Norval Morton, MD [id number], Damaris Hippo, PA-C  Brief Narrative: Patient is a 75 y.o. female with history of DM-2, HFpEF, hypothyroidism, RA-who presented with approximately 5-day history of abdominal pain, nausea, vomiting-found to have acute pancreatitis.  Subjective:  Patient in bed, appears comfortable, denies any headache, no fever, no chest pain or pressure, no shortness of breath ,much improved abdominal pain. No new focal weakness.   Objective: Vitals: Blood pressure (!) 113/51, pulse 73, temperature 99 F (37.2 C), temperature source Oral, resp. rate 15, height 5\' 6"  (1.676 m), weight 118.8 kg, SpO2 91 %.   Exam:  Awake Alert, No new F.N deficits, Normal affect [place].AT,PERRAL Supple Neck, No JVD,   Symmetrical Chest wall movement, Good air movement bilaterally, CTAB RRR,No Gallops, Rubs or new Murmurs,  +ve B.Sounds, Abd Soft, No tenderness,   No Cyanosis, Clubbing or edema    Pertinent Labs/Radiology: Recent Labs  Lab 10/19/21 1230 10/20/21 0044 10/21/21 0112 10/22/21 0058  WBC 9.8   < > 11.8* 7.8  HGB 13.3   < > 12.5 11.1*  PLT 211   < > 172 149*  NA 132*   < > 134* 133*  K 3.5   < > 3.9 3.4*  CREATININE 0.76   < > 0.89 0.65  AST 19  --  20 17  ALT 25  --  16 16  ALKPHOS 96  --  76 78  BILITOT 1.0  --  1.8* 1.8*   < > = values in this interval not displayed.    Assessment/Plan: Acute pancreatitis: Etiology unknown-no history of alcohol use-no gallstones seen on CT abdomen/RUQ ultrasound.  Triglycerides within normal limits.  Could be ?  Lasix (no longer on semaglutide since last December).  Tolerating clear liquids >> advance to soft -continue IVF and other supportive care-GI consulted today-continue to hold Zetia, IgG4 levels pending.  Monitor with supportive  care.  Hypokalemia: Replete and recheck  Chronic diastolic heart failure: Euvolemic on exam-hold Lasix-watch closely while on IVF acute pancreatitis:  DM-2 (A1c 9.6): Claims she is only on metformin as an outpatient-no longer on semaglutide for almost a year.  CBGs relatively stable-continue SSI 10 units of Semglee-follow and adjust.    Recent Labs    10/21/21 1613 10/21/21 2120 10/22/21 0816  GLUCAP 179* 165* 163*    Hypothyroidism: Continue Synthroid  Rheumatoid arthritis: Does not appear to be on any DMARDs-appears stable  Morbid Obesity Estimated body mass index is 42.29 kg/m as calculated from the following:   Height as of this encounter: 5\' 6"  (1.676 m).   Weight as of this encounter: 118.8 kg.    Procedures: None Consults: GI DVT Prophylaxis: Lovenox Code Status:Full code  Family Communication: None at bedside  Time spent: 25 minutes-Greater than 50% of this time was spent in counseling, explanation of diagnosis, planning of further management, and coordination of care.   Disposition Plan: Status is: Inpatient  Remains inpatient appropriate because: Acute pancreatitis-supportive care in progress-remains on clear liquids-still with a lot of symptoms-not yet stable for discharge.    Diet: Diet Order             DIET SOFT Room service appropriate? Yes; Fluid consistency:  Thin  Diet effective now                     Antimicrobial agents: Anti-infectives (From admission, onward)    None        MEDICATIONS: Scheduled Meds:  enoxaparin (LOVENOX) injection  60 mg Subcutaneous Q24H   insulin aspart  0-15 Units Subcutaneous TID WC & HS   insulin glargine-yfgn  12 Units Subcutaneous Daily   levothyroxine  175 mcg Oral Q0600   pantoprazole (PROTONIX) IV  40 mg Intravenous Q12H   polyethylene glycol  17 g Oral Daily   Continuous Infusions:   PRN Meds:.acetaminophen **OR** acetaminophen, ondansetron (ZOFRAN) IV, oxyCODONE   I have personally  reviewed following labs and imaging studies  LABORATORY DATA:  Recent Labs  Lab 10/19/21 1230 10/20/21 0044 10/21/21 0112 10/22/21 0058  WBC 9.8 11.0* 11.8* 7.8  HGB 13.3 13.0 12.5 11.1*  HCT 39.2 41.0 38.7 35.4*  PLT 211 189 172 149*  MCV 83.6 86.1 87.6 87.2  MCH 28.4 27.3 28.3 27.3  MCHC 33.9 31.7 32.3 31.4  RDW 13.9 14.1 14.2 14.0  LYMPHSABS 0.9  --   --   --   MONOABS 0.4  --   --   --   EOSABS 0.0  --   --   --   BASOSABS 0.0  --   --   --     Recent Labs  Lab 10/19/21 1230 10/19/21 1250 10/19/21 2031 10/20/21 0044 10/20/21 1200 10/21/21 0112 10/22/21 0058  NA 132*  --   --  133*  --  134* 133*  K 3.5  --   --  3.3*  --  3.9 3.4*  CL 97*  --   --  100  --  100 101  CO2 22  --   --  26  --  28 25  GLUCOSE 315*  --   --  242*  --  186* 170*  BUN 14  --   --  13  --  13 8  CREATININE 0.76  --   --  0.82  --  0.89 0.65  CALCIUM 10.3  --   --  10.0  --  10.1 9.9  AST 19  --   --   --   --  20 17  ALT 25  --   --   --   --  16 16  ALKPHOS 96  --   --   --   --  76 78  BILITOT 1.0  --   --   --   --  1.8* 1.8*  ALBUMIN 4.0  --   --   --   --  3.0* 2.5*  MG  --   --   --   --  1.8 1.8  --   LATICACIDVEN  --  2.3* 2.0* 1.4  --   --   --   HGBA1C  --   --  9.6*  --   --   --   --           RADIOLOGY STUDIES/RESULTS: DG Chest Port 1V same Day  Result Date: 10/20/2021 CLINICAL DATA:  Dyspnea, chest pain EXAM: PORTABLE CHEST 1 VIEW COMPARISON:  06/10/2020 FINDINGS: Lung volumes are small. Minimal left basilar atelectasis. Right suprahilar opacity likely represents vascular shadow on this semi-erect examination. No pneumothorax or pleural effusion. Cardiac size within normal limits. No acute bone abnormality. Degenerative changes are noted within the right shoulder.  IMPRESSION: Right suprahilar opacity likely represents vascular shadow. This could be confirmed with dedicated two view chest radiograph. No radiographic evidence of acute cardiopulmonary disease.  Electronically Signed   By: Fidela Salisbury M.D.   On: 10/20/2021 20:05     LOS: 3 days   Signature  Lala Lund M.D on 10/22/2021 at 11:22 AM   -  To page go to [email or web address]

## 2021-10-22 NOTE — Evaluation (Signed)
Physical Therapy Evaluation Patient Details Name: Linda Atkinson MRN: [id number] DOB: [birth year] Today's Date: 10/22/2021  History of Present Illness  75 y.o. female presents to [facility] hospital on 10/19/2021 with abdominal pain, nausea, vomiting. Pt found to have acute pancreatitis. PMH includes DM-2, HFpEF, hypothyroidism, RA.  Clinical Impression  Pt presents to PT with deficits in activity tolerance. Pt reports nearly a year of DOE, and does desaturate when mobilizing on room air. At rest oxygen levels are stable on room air. Pt will benefit from aggressive mobilization in an effort to improve activity tolerance. Pt will also benefit from energy conservation education.       Recommendations for follow up therapy are one component of a multi-disciplinary discharge planning process, led by the attending physician.  Recommendations may be updated based on patient status, additional functional criteria and insurance authorization.  Follow Up Recommendations No PT follow up    Assistance Recommended at Discharge None  Functional Status Assessment Patient has had a recent decline in their functional status and demonstrates the ability to make significant improvements in function in a reasonable and predictable amount of time.  Equipment Recommendations  Other (comment) (may need home O2)    Recommendations for Other Services       Precautions / Restrictions Precautions Precautions: Fall Precaution Comments: monitor SpO2 and O2 needs Restrictions Weight Bearing Restrictions: No      Mobility  Bed Mobility Overal bed mobility: Independent                  Transfers Overall transfer level: Independent                      Ambulation/Gait Ambulation/Gait assistance: Supervision Gait Distance (Feet): 150 Feet Assistive device: None Gait Pattern/deviations: Step-through pattern Gait velocity: functional Gait velocity interpretation: >2.62 ft/sec, indicative of  community ambulatory   General Gait Details: pt with slowed step-through gait  Stairs            Wheelchair Mobility    Modified Rankin (Stroke Patients Only)       Balance Overall balance assessment: Mild deficits observed, not formally tested                                           Pertinent Vitals/Pain Pain Assessment: Faces Faces Pain Scale: Hurts a little bit Pain Location: abdomen Pain Descriptors / Indicators: Grimacing Pain Intervention(s): Monitored during session    Home Living Family/patient expects to be discharged to:: Private residence Living Arrangements: Alone Available Help at Discharge: Family;Available PRN/intermittently Type of Home: Other(Comment) (condo) Home Access: Level entry       Home Layout: One level Home Equipment: None      Prior Function Prior Level of Function : Independent/Modified Independent;Working/employed;Driving             Mobility Comments: pt reports variable levels of activity, but is able to mobilize for community distances. 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 not on oxygen     Hand Dominance   Dominant Hand: Right    Extremity/Trunk Assessment   Upper Extremity Assessment Upper Extremity Assessment: Overall WFL for tasks assessed    Lower Extremity Assessment Lower Extremity Assessment: Overall WFL for tasks assessed    Cervical / Trunk Assessment Cervical / Trunk Assessment: Normal  Communication   Communication: HOH  Cognition Arousal/Alertness: Awake/alert Behavior During Therapy: WFL for tasks assessed/performed Overall Cognitive Status: Within Functional Limits for tasks assessed                                          General Comments General comments (skin integrity, edema, etc.): pt on 2L [place] upon PT arrival, weaned to room air with sats at 94% at rest  pre-mobility. Pt desats to 86% when ambulating, reporting SOB and with noted increased work of breathing. Pt recovers to 95% within 1-2 minutes of seated rest break. Pt left on RA, RN made aware    Exercises     Assessment/Plan    PT Assessment Patient needs continued PT services  PT Problem List Cardiopulmonary status limiting activity;Decreased activity tolerance       PT Treatment Interventions Gait training;Functional mobility training;Therapeutic activities;Therapeutic exercise;Balance training;Patient/family education    PT Goals (Current goals can be found in the Care Plan section)  Acute Rehab PT Goals Patient Stated Goal: to go home PT Goal Formulation: With patient Time For Goal Achievement: 11/05/21 Potential to Achieve Goals: Good Additional Goals Additional Goal #1: Pt will report 3/10 DOE or less when ambulating for >300' to demonstrate improved activity tolerance    Frequency Min 2X/week   Barriers to discharge        Co-evaluation               AM-PAC PT "6 Clicks" Mobility  Outcome Measure Help needed turning from your back to your side while in a flat bed without using bedrails?: None Help needed moving from lying on your back to sitting on the side of a flat bed without using bedrails?: None Help needed moving to and from a bed to a chair (including a wheelchair)?: None Help needed standing up from a chair using your arms (e.g., wheelchair or bedside chair)?: None Help needed to walk in hospital room?: A Little Help needed climbing 3-5 steps with a railing? : A Little 6 Click Score: 22    End of Session Equipment Utilized During Treatment: Oxygen Activity Tolerance: Patient limited by fatigue Patient left: in bed;with call bell/phone within reach;with family/visitor present Nurse Communication: Mobility status PT Visit Diagnosis: Other abnormalities of gait and mobility (R26.89)    Time: 1610-9604 PT Time Calculation (min) (ACUTE ONLY): 18  min   Charges:   PT Evaluation $PT Eval Low Complexity: 1 Low          Zenaida Niece, PT, DPT Acute Rehabilitation Pager: [phone] Office [phone]   Zenaida Niece 10/22/2021, 2:01 PM

## 2021-10-22 NOTE — Progress Notes (Signed)
SPOKE TO DR Alcario Drought OK TO DISCONTINUE TELEMETRY

## 2021-10-22 NOTE — Progress Notes (Signed)
Daily Rounding Note  10/22/2021, 10:16 AM  LOS: 3 days   SUBJECTIVE:   Chief complaint: Acute pancreatitis.     Current pain management is with oxycodone, Dilaudid discontinued. Pain is better, lingers in LLQ and in upper back.  No nausea.  Tolerating clears.  Diet has been advanced to soft.  No difficulty breathing.  Urinating well.  OBJECTIVE:         Vital signs in last 24 hours:    Temp:  [98.4 F (36.9 C)-99.8 F (37.7 C)] 99 F (37.2 C) (12/18 0813) Pulse Rate:  [71-97] 73 (12/18 0813) Resp:  [15-25] 15 (12/18 0813) BP: (100-136)/(51-72) 113/51 (12/18 0813) SpO2:  [90 %-97 %] 91 % (12/18 0813) Last BM Date: 10/19/21 Filed Weights   10/19/21 1211 10/20/21 0357  Weight: 117.9 kg 118.8 kg   General: Pleasant, comfortable.  Obese.  Not toxic. Heart: RRR. Chest: Clear bilaterally.  No labored breathing or cough Abdomen: Obese, soft.  Minor tenderness in the left abdomen without guarding or rebound.  Active bowel sounds.  No distention Neuro/Psych: Pleasant, cooperative, in good spirits.  Intake/Output from previous day: 12/17 0701 - 12/18 0700 In: 360 [P.O.:360] Out: -   Intake/Output this shift: No intake/output data recorded.  Lab Results: Recent Labs    10/20/21 0044 10/21/21 0112 10/22/21 0058  WBC 11.0* 11.8* 7.8  HGB 13.0 12.5 11.1*  HCT 41.0 38.7 35.4*  PLT 189 172 149*   BMET Recent Labs    10/20/21 0044 10/21/21 0112 10/22/21 0058  NA 133* 134* 133*  K 3.3* 3.9 3.4*  CL 100 100 101  CO2 26 28 25   GLUCOSE 242* 186* 170*  BUN 13 13 8   CREATININE 0.82 0.89 0.65  CALCIUM 10.0 10.1 9.9   LFT Recent Labs    10/19/21 1230 10/21/21 0112 10/22/21 0058  PROT 6.7 5.5* 5.0*  ALBUMIN 4.0 3.0* 2.5*  AST 19 20 17   ALT 25 16 16   ALKPHOS 96 76 78  BILITOT 1.0 1.8* 1.8*   PT/INR No results for input(s): LABPROT, INR in the last 72 hours. Hepatitis Panel No results for input(s):  HEPBSAG, HCVAB, HEPAIGM, HEPBIGM in the last 72 hours.  Studies/Results: DG Chest Port 1V same Day  Result Date: 10/20/2021 CLINICAL DATA:  Dyspnea, chest pain EXAM: PORTABLE CHEST 1 VIEW COMPARISON:  06/10/2020 FINDINGS: Lung volumes are small. Minimal left basilar atelectasis. Right suprahilar opacity likely represents vascular shadow on this semi-erect examination. No pneumothorax or pleural effusion. Cardiac size within normal limits. No acute bone abnormality. Degenerative changes are noted within the right shoulder. IMPRESSION: Right suprahilar opacity likely represents vascular shadow. This could be confirmed with dedicated two view chest radiograph. No radiographic evidence of acute cardiopulmonary disease. Electronically Signed   By: Fidela Salisbury M.D.   On: 10/20/2021 20:05    Scheduled Meds:  enoxaparin (LOVENOX) injection  60 mg Subcutaneous Q24H   insulin aspart  0-15 Units Subcutaneous TID WC & HS   insulin glargine-yfgn  12 Units Subcutaneous Daily   levothyroxine  175 mcg Oral Q0600   pantoprazole (PROTONIX) IV  40 mg Intravenous Q12H   polyethylene glycol  17 g Oral Daily   Continuous Infusions: PRN Meds:.acetaminophen **OR** acetaminophen, ondansetron (ZOFRAN) IV, oxyCODONE   ASSESMENT:   Acute pancreatitis, reactive duodenal inflammation..  Initial onset.  Patient does not drink alcohol.  No gallstones on ultrasound.  No hypertriglyceridemia.  Started on Zetia in the last few months  so question med related pancreatitis.  IgG4 in process for rule out autoimmune pancreatitis.  Lipase much improved.  Other than T bili 1.8, LFTs consistently normal.  Fatty liver.    NIDDM.      PLAN   Agree with soft diet.  If continues to clinically improved tomorrow, could go home.  Await pending IgG4    Linda Atkinson  10/22/2021, 10:16 AM Phone [phone]

## 2021-10-23 ENCOUNTER — Telehealth: Payer: Self-pay

## 2021-10-23 LAB — MAGNESIUM: Magnesium: 1.9 mg/dL (ref 1.7–2.4)

## 2021-10-23 LAB — GLUCOSE, CAPILLARY
Glucose-Capillary: 152 mg/dL — ABNORMAL HIGH (ref 70–99)
Glucose-Capillary: 168 mg/dL — ABNORMAL HIGH (ref 70–99)
Glucose-Capillary: 184 mg/dL — ABNORMAL HIGH (ref 70–99)
Glucose-Capillary: 193 mg/dL — ABNORMAL HIGH (ref 70–99)

## 2021-10-23 LAB — COMPREHENSIVE METABOLIC PANEL
ALT: 20 U/L (ref 0–44)
AST: 24 U/L (ref 15–41)
Albumin: 2.3 g/dL — ABNORMAL LOW (ref 3.5–5.0)
Alkaline Phosphatase: 77 U/L (ref 38–126)
Anion gap: 6 (ref 5–15)
BUN: <5 mg/dL — ABNORMAL LOW (ref 8–23)
CO2: 26 mmol/L (ref 22–32)
Calcium: 9.5 mg/dL (ref 8.9–10.3)
Chloride: 102 mmol/L (ref 98–111)
Creatinine, Ser: 0.69 mg/dL (ref 0.44–1.00)
GFR, Estimated: >60 mL/min (ref >60)
Glucose, Bld: 136 mg/dL — ABNORMAL HIGH (ref 70–99)
Potassium: 3.7 mmol/L (ref 3.5–5.1)
Sodium: 134 mmol/L — ABNORMAL LOW (ref 135–145)
Total Bilirubin: 2 mg/dL — ABNORMAL HIGH (ref 0.3–1.2)
Total Protein: 4.9 g/dL — ABNORMAL LOW (ref 6.5–8.1)

## 2021-10-23 LAB — CBC WITH DIFFERENTIAL/PLATELET
Abs Immature Granulocytes: 0.05 10*3/uL (ref 0.00–0.07)
Basophils Absolute: 0 10*3/uL (ref 0.0–0.1)
Basophils Relative: 0 %
Eosinophils Absolute: 0.2 10*3/uL (ref 0.0–0.5)
Eosinophils Relative: 3 %
HCT: 34.7 % — ABNORMAL LOW (ref 36.0–46.0)
Hemoglobin: 11.1 g/dL — ABNORMAL LOW (ref 12.0–15.0)
Immature Granulocytes: 1 %
Lymphocytes Relative: 20 %
Lymphs Abs: 1.5 10*3/uL (ref 0.7–4.0)
MCH: 27.9 pg (ref 26.0–34.0)
MCHC: 32 g/dL (ref 30.0–36.0)
MCV: 87.2 fL (ref 80.0–100.0)
Monocytes Absolute: 0.7 10*3/uL (ref 0.1–1.0)
Monocytes Relative: 9 %
Neutro Abs: 5.1 10*3/uL (ref 1.7–7.7)
Neutrophils Relative %: 67 %
Platelets: 170 10*3/uL (ref 150–400)
RBC: 3.98 MIL/uL (ref 3.87–5.11)
RDW: 13.8 % (ref 11.5–15.5)
WBC: 7.5 10*3/uL (ref 4.0–10.5)
nRBC: 0 % (ref 0.0–0.2)

## 2021-10-23 LAB — BRAIN NATRIURETIC PEPTIDE: B Natriuretic Peptide: 208.3 pg/mL — ABNORMAL HIGH (ref 0.0–100.0)

## 2021-10-23 LAB — LIPASE, BLOOD: Lipase: 40 U/L (ref 11–51)

## 2021-10-23 LAB — IGG 4: IgG, Subclass 4: 15 mg/dL (ref 2–96)

## 2021-10-23 MED ORDER — POLYETHYLENE GLYCOL 3350 17 G PO PACK
17.0000 g | PACK | Freq: Two times a day (BID) | ORAL | Status: DC
  Filled 2021-10-23: qty 1

## 2021-10-23 MED ORDER — LACTATED RINGERS IV SOLN
INTRAVENOUS | Status: DC
  Administered 2021-10-24: 06:00:00 50 mL/h via INTRAVENOUS

## 2021-10-23 MED ORDER — MAGNESIUM HYDROXIDE 400 MG/5ML PO SUSP
30.0000 mL | Freq: Two times a day (BID) | ORAL | Status: DC
  Administered 2021-10-23: 12:00:00 30 mL via ORAL
  Filled 2021-10-23 (×2): qty 30

## 2021-10-23 NOTE — Telephone Encounter (Signed)
Pt returned call. She states that the appt time is fine for her. Pt is aware that I will place her appt information in the mail. Pt verbalized understanding and had no concerns at the end of the call.

## 2021-10-23 NOTE — Telephone Encounter (Signed)
Patient has been scheduled for a hospital follow up with Dr. Havery Moros on Tuesday, 12/05/21 at 8:30 am.   Lm on vm for patient to return call to discuss.

## 2021-10-23 NOTE — Telephone Encounter (Signed)
-----   Message from Yetta Flock, MD sent at 10/22/2021  5:50 PM EST ----- Regarding: outpatient follow up [facility] this patient needs a follow up with me or APP in the next 1-2 months for a routine office follow up post hospitalization for pancreatitis. Thanks

## 2021-10-23 NOTE — Progress Notes (Signed)
PROGRESS NOTE        PATIENT DETAILS Name: Linda Atkinson Age: 75 y.o. Sex: female Date of Birth: June 26, 1946 Admit Date: 10/19/2021 Admitting Physician Norval Morton, MD [id number], Damaris Hippo, PA-C  Brief Narrative: Patient is a 75 y.o. female with history of DM-2, HFpEF, hypothyroidism, RA-who presented with approximately 5-day history of abdominal pain, nausea, vomiting-found to have acute pancreatitis.  Subjective:  Patient in bed, appears comfortable, denies any headache, no fever, no chest pain or pressure, no shortness of breath , +ve abdominal pain. No new focal weakness.    Objective: Vitals: Blood pressure (!) 149/88, pulse 84, temperature 98.2 F (36.8 C), temperature source Oral, resp. rate 18, height 5\' 6"  (1.676 m), weight 118.8 kg, SpO2 96 %.   Exam:  Awake Alert, No new F.N deficits, Normal affect [place].AT,PERRAL Supple Neck, No JVD,   Symmetrical Chest wall movement, Good air movement bilaterally, CTAB RRR,No Gallops, Rubs or new Murmurs,  +ve B.Sounds, Abd Soft, No tenderness,   No Cyanosis, Clubbing or edema     Pertinent Labs/Radiology: Recent Labs  Lab 10/19/21 1230 10/20/21 0044 10/21/21 0112 10/22/21 0058 10/23/21 0056  WBC 9.8   < > 11.8* 7.8 7.5  HGB 13.3   < > 12.5 11.1* 11.1*  PLT 211   < > 172 149* 170  NA 132*   < > 134* 133* 134*  K 3.5   < > 3.9 3.4* 3.7  CREATININE 0.76   < > 0.89 0.65 0.69  AST 19  --  20 17 24   ALT 25  --  16 16 20   ALKPHOS 96  --  76 78 77  BILITOT 1.0  --  1.8* 1.8* 2.0*   < > = values in this interval not displayed.    Assessment/Plan: Acute pancreatitis: Etiology unknown-no history of alcohol use-no gallstones seen on CT abdomen/RUQ ultrasound.  Triglycerides within normal limits.  Zetia now on hold could be the potential culprit.  Tolerating clear liquids abdominal pain somewhat worse on 10/23/2021, continue clears, has been hydrated with IV fluids, GI on board. Continue to hold  Zetia, IgG4 levels pending.  Monitor with supportive care.  Constipation.  Placed on bowel regimen.    Hypokalemia: Replete and recheck  Chronic diastolic heart failure: Euvolemic on exam-hold Lasix-watch closely while on IVF acute pancreatitis:  DM-2 (A1c 9.6): Claims she is only on metformin as an outpatient-no longer on semaglutide for almost a year.  CBGs relatively stable-continue SSI 10 units of Semglee-follow and adjust.    Recent Labs    10/22/21 1633 10/22/21 2053 10/23/21 0823  GLUCAP 136* 170* 152*    Hypothyroidism: Continue Synthroid  Rheumatoid arthritis: Does not appear to be on any DMARDs-appears stable  Morbid Obesity Estimated body mass index is 42.29 kg/m as calculated from the following:   Height as of this encounter: 5\' 6"  (1.676 m).   Weight as of this encounter: 118.8 kg.    Procedures: None Consults: GI DVT Prophylaxis: Lovenox Code Status:Full code  Family Communication: None at bedside  Time spent: 25 minutes-Greater than 50% of this time was spent in counseling, explanation of diagnosis, planning of further management, and coordination of care.   Disposition Plan: Status is: Inpatient  Remains inpatient appropriate because: Acute pancreatitis-supportive care in progress-remains on clear liquids-still with a lot of symptoms-not yet stable for discharge.  Diet: Diet Order             Diet clear liquid Room service appropriate? Yes; Fluid consistency: Thin  Diet effective now                     Antimicrobial agents: Anti-infectives (From admission, onward)    None        MEDICATIONS: Scheduled Meds:  enoxaparin (LOVENOX) injection  60 mg Subcutaneous Q24H   insulin aspart  0-15 Units Subcutaneous TID WC & HS   insulin glargine-yfgn  12 Units Subcutaneous Daily   levothyroxine  175 mcg Oral Q0600   magnesium hydroxide  30 mL Oral BID   pantoprazole (PROTONIX) IV  40 mg Intravenous Q12H   polyethylene glycol  17 g  Oral BID   Continuous Infusions:   PRN Meds:.acetaminophen **OR** acetaminophen, ondansetron (ZOFRAN) IV, oxyCODONE   I have personally reviewed following labs and imaging studies  LABORATORY DATA:  Recent Labs  Lab 10/19/21 1230 10/20/21 0044 10/21/21 0112 10/22/21 0058 10/23/21 0056  WBC 9.8 11.0* 11.8* 7.8 7.5  HGB 13.3 13.0 12.5 11.1* 11.1*  HCT 39.2 41.0 38.7 35.4* 34.7*  PLT 211 189 172 149* 170  MCV 83.6 86.1 87.6 87.2 87.2  MCH 28.4 27.3 28.3 27.3 27.9  MCHC 33.9 31.7 32.3 31.4 32.0  RDW 13.9 14.1 14.2 14.0 13.8  LYMPHSABS 0.9  --   --   --  1.5  MONOABS 0.4  --   --   --  0.7  EOSABS 0.0  --   --   --  0.2  BASOSABS 0.0  --   --   --  0.0    Recent Labs  Lab 10/19/21 1230 10/19/21 1250 10/19/21 2031 10/20/21 0044 10/20/21 1200 10/21/21 0112 10/22/21 0058 10/23/21 0056  NA 132*  --   --  133*  --  134* 133* 134*  K 3.5  --   --  3.3*  --  3.9 3.4* 3.7  CL 97*  --   --  100  --  100 101 102  CO2 22  --   --  26  --  28 25 26   GLUCOSE 315*  --   --  242*  --  186* 170* 136*  BUN 14  --   --  13  --  13 8 <5*  CREATININE 0.76  --   --  0.82  --  0.89 0.65 0.69  CALCIUM 10.3  --   --  10.0  --  10.1 9.9 9.5  AST 19  --   --   --   --  20 17 24   ALT 25  --   --   --   --  16 16 20   ALKPHOS 96  --   --   --   --  76 78 77  BILITOT 1.0  --   --   --   --  1.8* 1.8* 2.0*  ALBUMIN 4.0  --   --   --   --  3.0* 2.5* 2.3*  MG  --   --   --   --  1.8 1.8  --  1.9  LATICACIDVEN  --  2.3* 2.0* 1.4  --   --   --   --   HGBA1C  --   --  9.6*  --   --   --   --   --   BNP  --   --   --   --   --   --   --  208.3*      RADIOLOGY STUDIES/RESULTS: No results found.   LOS: 4 days   Signature  Lala Lund M.D on 10/23/2021 at 11:29 AM   -  To page go to [email or web address]

## 2021-10-23 NOTE — Care Management Important Message (Signed)
Important Message  Patient Details  Name: Linda Atkinson MRN: [id number] Date of Birth: 20-Sep-1946   Medicare Important Message Given:  Yes     Orbie Pyo 10/23/2021, 3:19 PM

## 2021-10-24 LAB — CBC
HCT: 34.8 % — ABNORMAL LOW (ref 36.0–46.0)
Hemoglobin: 11.2 g/dL — ABNORMAL LOW (ref 12.0–15.0)
MCH: 27.6 pg (ref 26.0–34.0)
MCHC: 32.2 g/dL (ref 30.0–36.0)
MCV: 85.7 fL (ref 80.0–100.0)
Platelets: 189 10*3/uL (ref 150–400)
RBC: 4.06 MIL/uL (ref 3.87–5.11)
RDW: 14 % (ref 11.5–15.5)
WBC: 8.3 10*3/uL (ref 4.0–10.5)
nRBC: 0 % (ref 0.0–0.2)

## 2021-10-24 LAB — COMPREHENSIVE METABOLIC PANEL
ALT: 20 U/L (ref 0–44)
AST: 20 U/L (ref 15–41)
Albumin: 2.3 g/dL — ABNORMAL LOW (ref 3.5–5.0)
Alkaline Phosphatase: 90 U/L (ref 38–126)
Anion gap: 6 (ref 5–15)
BUN: <5 mg/dL — ABNORMAL LOW (ref 8–23)
CO2: 27 mmol/L (ref 22–32)
Calcium: 10 mg/dL (ref 8.9–10.3)
Chloride: 101 mmol/L (ref 98–111)
Creatinine, Ser: 0.64 mg/dL (ref 0.44–1.00)
GFR, Estimated: >60 mL/min (ref >60)
Glucose, Bld: 161 mg/dL — ABNORMAL HIGH (ref 70–99)
Potassium: 3.4 mmol/L — ABNORMAL LOW (ref 3.5–5.1)
Sodium: 134 mmol/L — ABNORMAL LOW (ref 135–145)
Total Bilirubin: 1.4 mg/dL — ABNORMAL HIGH (ref 0.3–1.2)
Total Protein: 5.1 g/dL — ABNORMAL LOW (ref 6.5–8.1)

## 2021-10-24 LAB — MAGNESIUM: Magnesium: 2 mg/dL (ref 1.7–2.4)

## 2021-10-24 LAB — CBC WITH DIFFERENTIAL/PLATELET
Abs Immature Granulocytes: 0.08 10*3/uL — ABNORMAL HIGH (ref 0.00–0.07)
Basophils Absolute: 0 10*3/uL (ref 0.0–0.1)
Basophils Relative: 0 %
Eosinophils Absolute: 0.3 10*3/uL (ref 0.0–0.5)
Eosinophils Relative: 3 %
HCT: 34.4 % — ABNORMAL LOW (ref 36.0–46.0)
Hemoglobin: 11 g/dL — ABNORMAL LOW (ref 12.0–15.0)
Immature Granulocytes: 1 %
Lymphocytes Relative: 15 %
Lymphs Abs: 1.4 10*3/uL (ref 0.7–4.0)
MCH: 27.4 pg (ref 26.0–34.0)
MCHC: 32 g/dL (ref 30.0–36.0)
MCV: 85.8 fL (ref 80.0–100.0)
Monocytes Absolute: 0.8 10*3/uL (ref 0.1–1.0)
Monocytes Relative: 9 %
Neutro Abs: 6.5 10*3/uL (ref 1.7–7.7)
Neutrophils Relative %: 72 %
Platelets: 184 10*3/uL (ref 150–400)
RBC: 4.01 MIL/uL (ref 3.87–5.11)
RDW: 13.9 % (ref 11.5–15.5)
WBC: 9 10*3/uL (ref 4.0–10.5)
nRBC: 0 % (ref 0.0–0.2)

## 2021-10-24 LAB — TYPE AND SCREEN
ABO/RH(D): A POS
Antibody Screen: NEGATIVE

## 2021-10-24 LAB — GLUCOSE, CAPILLARY
Glucose-Capillary: 187 mg/dL — ABNORMAL HIGH (ref 70–99)
Glucose-Capillary: 172 mg/dL — ABNORMAL HIGH (ref 70–99)
Glucose-Capillary: 167 mg/dL — ABNORMAL HIGH (ref 70–99)
Glucose-Capillary: 190 mg/dL — ABNORMAL HIGH (ref 70–99)

## 2021-10-24 LAB — ABO/RH: ABO/RH(D): A POS

## 2021-10-24 LAB — LIPASE, BLOOD: Lipase: 37 U/L (ref 11–51)

## 2021-10-24 LAB — BRAIN NATRIURETIC PEPTIDE: B Natriuretic Peptide: 189.5 pg/mL — ABNORMAL HIGH (ref 0.0–100.0)

## 2021-10-24 MED ORDER — POTASSIUM CHLORIDE CRYS ER 20 MEQ PO TBCR
40.0000 meq | EXTENDED_RELEASE_TABLET | Freq: Once | ORAL | Status: AC
  Administered 2021-10-24: 09:00:00 40 meq via ORAL
  Filled 2021-10-24: qty 2

## 2021-10-24 MED ORDER — POLYETHYLENE GLYCOL 3350 17 G PO PACK: 17.0000 g | PACK | Freq: Two times a day (BID) | ORAL | Status: DC | PRN

## 2021-10-24 MED ORDER — GUAIFENESIN-DM 100-10 MG/5ML PO SYRP
10.0000 mL | ORAL_SOLUTION | Freq: Three times a day (TID) | ORAL | Status: DC | PRN
  Administered 2021-10-24 – 2021-10-25 (×2): 10 mL via ORAL
  Filled 2021-10-24 (×3): qty 10

## 2021-10-24 MED ORDER — GUAIFENESIN 100 MG/5ML PO LIQD: 10.0000 mL | Freq: Three times a day (TID) | ORAL | Status: DC | PRN

## 2021-10-24 NOTE — Progress Notes (Signed)
Physical Therapy Treatment and Discharge Patient Details Name: Linda Atkinson MRN: [id number] DOB: September 09, 1946 Today's Date: 10/24/2021   History of Present Illness 75 y.o. female presents to [facility] hospital on 10/19/2021 with abdominal pain, nausea, vomiting. Pt found to have acute pancreatitis. PMH includes DM-2, HFpEF, hypothyroidism, RA.    PT Comments    Patient attempting to get up to go to bathroom, but unable due to bil SCDs on LEs. Assisted pt to remove and pt ambulating independently to bathroom. Continued to walk 150 ft in room (did not want to walk in hallway) with no assist needed. Sats 93% on room air with dyspnea 2/4. No further PT needs identified and RN aware pt safe to ambulate independently in room.     Recommendations for follow up therapy are one component of a multi-disciplinary discharge planning process, led by the attending physician.  Recommendations may be updated based on patient status, additional functional criteria and insurance authorization.  Follow Up Recommendations  No PT follow up     Assistance Recommended at Discharge None  Equipment Recommendations  Other (comment) (may need home O2)    Recommendations for Other Services       Precautions / Restrictions Precautions Precautions: Fall Precaution Comments: monitor SpO2 and O2 needs Restrictions Weight Bearing Restrictions: No     Mobility  Bed Mobility Overal bed mobility: Independent             General bed mobility comments: supin<>sit flat bed    Transfers Overall transfer level: Independent                      Ambulation/Gait Ambulation/Gait assistance: Independent Gait Distance (Feet): 150 Feet Assistive device: None Gait Pattern/deviations: Step-through pattern;WFL(Within Functional Limits) Gait velocity: functional         Stairs             Wheelchair Mobility    Modified Rankin (Stroke Patients Only)       Balance Overall balance assessment:  No apparent balance deficits (not formally assessed) Sitting-balance support: No upper extremity supported;Feet supported Sitting balance-Leahy Scale: Normal     Standing balance support: No upper extremity supported;During functional activity Standing balance-Leahy Scale: Normal                              Cognition Arousal/Alertness: Awake/alert Behavior During Therapy: WFL for tasks assessed/performed Overall Cognitive Status: Within Functional Limits for tasks assessed                                          Exercises      General Comments General comments (skin integrity, edema, etc.): on RA with sats 93% with ambulation      Pertinent Vitals/Pain Pain Assessment: Faces Faces Pain Scale: Hurts a little bit Pain Location: abdomen Pain Descriptors / Indicators: Grimacing Pain Intervention(s): Limited activity within patient's tolerance;Monitored during session    Home Living                          Prior Function            PT Goals (current goals can now be found in the care plan section) Acute Rehab PT Goals Patient Stated Goal: to go home PT Goal Formulation: With patient Time For Goal Achievement:  11/05/21 Potential to Achieve Goals: Good Progress towards PT goals: Goals met/education completed, patient discharged from PT    Frequency    Min 2X/week      PT Plan Current plan remains appropriate    Co-evaluation              AM-PAC PT "6 Clicks" Mobility   Outcome Measure  Help needed turning from your back to your side while in a flat bed without using bedrails?: None Help needed moving from lying on your back to sitting on the side of a flat bed without using bedrails?: None Help needed moving to and from a bed to a chair (including a wheelchair)?: None Help needed standing up from a chair using your arms (e.g., wheelchair or bedside chair)?: None Help needed to walk in hospital room?: None Help  needed climbing 3-5 steps with a railing? : None 6 Click Score: 24    End of Session   Activity Tolerance: Patient tolerated treatment well Patient left: with call bell/phone within reach;in chair Nurse Communication: Mobility status;Other (comment) (Ok to be up I'ly; SCDs left off to allow) PT Visit Diagnosis: Other abnormalities of gait and mobility (R26.89)     Time: 6195-0932 PT Time Calculation (min) (ACUTE ONLY): 21 min  Charges:  $Gait Training: 8-22 mins                      Arby Barrette, PT Acute Rehabilitation Services  Pager [phone] Office [phone]    Rexanne Mano 10/24/2021, 9:17 AM

## 2021-10-24 NOTE — Progress Notes (Addendum)
OT Cancellation Note  Patient Details Name: Linda Atkinson MRN: [id number] DOB: 23-Jul-1946   Cancelled Treatment:    Reason Eval/Treat Not Completed: Patient declined, no reason specified Pt sleeping comfortably, pt did not wakeup to to knocking or therapist's voice. Per chart, noted pt to be making great progress and was d/c from PT.  During previous session, pt voiced frustration with not being able to sleep. Did not wake pt at this time to allow her time to rest. Left energy conservation handout on pt's tray table. Will follow as time allows and pt is appropriate.   [employer] [facility] [employer] OTR/L Acute Rehabilitation Services Office: [place] 10/24/2021, 3:32 PM

## 2021-10-24 NOTE — Progress Notes (Signed)
PROGRESS NOTE        PATIENT DETAILS Name: Linda Atkinson Age: 75 y.o. Sex: female Date of Birth: 1946/10/25 Admit Date: 10/19/2021 Admitting Physician Norval Morton, MD GXQ:JJHER, Damaris Hippo, PA-C  Brief Narrative: Patient is a 75 y.o. female with history of DM-2, HFpEF, hypothyroidism, RA-who presented with approximately 5-day history of abdominal pain, nausea, vomiting-found to have acute pancreatitis.  Subjective:  Patient in bed, appears comfortable, denies any headache, no fever, no chest pain or pressure, no shortness of breath , no abdominal pain. No new focal weakness.    Objective: Vitals: Blood pressure (!) 138/53, pulse 68, temperature 98.3 F (36.8 C), temperature source Oral, resp. rate 18, height 5\' 6"  (1.676 m), weight 118.8 kg, SpO2 91 %.   Exam:  Awake Alert, No new F.N deficits, Normal affect [name],PERRAL Supple Neck, No JVD,   Symmetrical Chest wall movement, Good air movement bilaterally, CTAB RRR,No Gallops, Rubs or new Murmurs,  +ve B.Sounds, Abd Soft, No tenderness,   No Cyanosis, Clubbing or edema      Assessment/Plan:  Acute pancreatitis: Etiology unknown-no history of alcohol use-no gallstones seen on CT abdomen/RUQ ultrasound.  Triglycerides within normal limits.  Zetia now on hold could be the potential culprit.  Tolerating clear liquids abdominal pain somewhat worse on 10/23/2021, continue clears, has been hydrated with IV fluids, GI on board. Continue to hold Zetia, IgG4 levels -ve .  Monitor with supportive care.  Constipation.  Placed on bowel regimen, resolved.    Blood in stool - hemorrhoidal, stable H&H, stool softeners, monitor.  Hypokalemia: Replete and recheck  Chronic diastolic heart failure: Euvolemic on exam-hold Lasix-watch closely while on IVF acute pancreatitis:  DM-2 (A1c 9.6): Claims she is only on metformin as an outpatient-no longer on semaglutide for almost a year.  CBGs relatively  stable-continue SSI 10 units of Semglee-follow and adjust.    Recent Labs    10/23/21 2055 10/24/21 0806 10/24/21 1218  GLUCAP 193* 187* 172*    Hypothyroidism: Continue Synthroid  Rheumatoid arthritis: Does not appear to be on any DMARDs-appears stable  Morbid Obesity Estimated body mass index is 42.29 kg/m as calculated from the following:   Height as of this encounter: 5\' 6"  (1.676 m).   Weight as of this encounter: 118.8 kg.    Procedures: None Consults: GI DVT Prophylaxis: Lovenox Code Status:Full code  Family Communication: None at bedside  Time spent: 25 minutes-Greater than 50% of this time was spent in counseling, explanation of diagnosis, planning of further management, and coordination of care.   Disposition Plan: Status is: Inpatient  Remains inpatient appropriate because: Acute pancreatitis-supportive care in progress-remains on clear liquids-still with a lot of symptoms-not yet stable for discharge.    Diet: Diet Order             Diet clear liquid Room service appropriate? Yes; Fluid consistency: Thin  Diet effective now                     Antimicrobial agents: Anti-infectives (From admission, onward)    None        MEDICATIONS: Scheduled Meds:  insulin aspart  0-15 Units Subcutaneous TID WC & HS   insulin glargine-yfgn  12 Units Subcutaneous Daily   levothyroxine  175 mcg Oral Q0600   pantoprazole (PROTONIX) IV  40 mg Intravenous Q12H  Continuous Infusions:   PRN Meds:.acetaminophen **OR** acetaminophen, ondansetron (ZOFRAN) IV, oxyCODONE, polyethylene glycol   I have personally reviewed following labs and imaging studies  LABORATORY DATA:  Recent Labs  Lab 10/19/21 1230 10/20/21 0044 10/21/21 0112 10/22/21 0058 10/23/21 0056 10/24/21 0206 10/24/21 0827  WBC 9.8   < > 11.8* 7.8 7.5 9.0 8.3  HGB 13.3   < > 12.5 11.1* 11.1* 11.0* 11.2*  HCT 39.2   < > 38.7 35.4* 34.7* 34.4* 34.8*  PLT 211   < > 172 149* 170 184  189  MCV 83.6   < > 87.6 87.2 87.2 85.8 85.7  MCH 28.4   < > 28.3 27.3 27.9 27.4 27.6  MCHC 33.9   < > 32.3 31.4 32.0 32.0 32.2  RDW 13.9   < > 14.2 14.0 13.8 13.9 14.0  LYMPHSABS 0.9  --   --   --  1.5 1.4  --   MONOABS 0.4  --   --   --  0.7 0.8  --   EOSABS 0.0  --   --   --  0.2 0.3  --   BASOSABS 0.0  --   --   --  0.0 0.0  --    < > = values in this interval not displayed.    Recent Labs  Lab 10/19/21 1230 10/19/21 1250 10/19/21 2031 10/20/21 0044 10/20/21 1200 10/21/21 0112 10/22/21 0058 10/23/21 0056 10/24/21 0206  NA 132*  --   --  133*  --  134* 133* 134* 134*  K 3.5  --   --  3.3*  --  3.9 3.4* 3.7 3.4*  CL 97*  --   --  100  --  100 101 102 101  CO2 22  --   --  26  --  28 25 26 27   GLUCOSE 315*  --   --  242*  --  186* 170* 136* 161*  BUN 14  --   --  13  --  13 8 <5* <5*  CREATININE 0.76  --   --  0.82  --  0.89 0.65 0.69 0.64  CALCIUM 10.3  --   --  10.0  --  10.1 9.9 9.5 10.0  AST 19  --   --   --   --  20 17 24 20   ALT 25  --   --   --   --  16 16 20 20   ALKPHOS 96  --   --   --   --  76 78 77 90  BILITOT 1.0  --   --   --   --  1.8* 1.8* 2.0* 1.4*  ALBUMIN 4.0  --   --   --   --  3.0* 2.5* 2.3* 2.3*  MG  --   --   --   --  1.8 1.8  --  1.9 2.0  LATICACIDVEN  --  2.3* 2.0* 1.4  --   --   --   --   --   HGBA1C  --   --  9.6*  --   --   --   --   --   --   BNP  --   --   --   --   --   --   --  208.3* 189.5*      RADIOLOGY STUDIES/RESULTS: No results found.   LOS: 5 days   Signature  Lala Lund M.D on 10/24/2021 at  1:52 PM   -  To page go to [email or web address]

## 2021-10-25 ENCOUNTER — Other Ambulatory Visit (HOSPITAL_COMMUNITY): Payer: Self-pay

## 2021-10-25 LAB — CBC WITH DIFFERENTIAL/PLATELET
Abs Immature Granulocytes: 0.14 10*3/uL — ABNORMAL HIGH (ref 0.00–0.07)
Basophils Absolute: 0.1 10*3/uL (ref 0.0–0.1)
Basophils Relative: 1 %
Eosinophils Absolute: 0.3 10*3/uL (ref 0.0–0.5)
Eosinophils Relative: 3 %
HCT: 34.4 % — ABNORMAL LOW (ref 36.0–46.0)
Hemoglobin: 11.2 g/dL — ABNORMAL LOW (ref 12.0–15.0)
Immature Granulocytes: 1 %
Lymphocytes Relative: 17 %
Lymphs Abs: 1.7 10*3/uL (ref 0.7–4.0)
MCH: 28.1 pg (ref 26.0–34.0)
MCHC: 32.6 g/dL (ref 30.0–36.0)
MCV: 86.4 fL (ref 80.0–100.0)
Monocytes Absolute: 0.8 10*3/uL (ref 0.1–1.0)
Monocytes Relative: 8 %
Neutro Abs: 7.2 10*3/uL (ref 1.7–7.7)
Neutrophils Relative %: 70 %
Platelets: 213 10*3/uL (ref 150–400)
RBC: 3.98 MIL/uL (ref 3.87–5.11)
RDW: 14 % (ref 11.5–15.5)
WBC: 10.2 10*3/uL (ref 4.0–10.5)
nRBC: 0 % (ref 0.0–0.2)

## 2021-10-25 LAB — COMPREHENSIVE METABOLIC PANEL
ALT: 19 U/L (ref 0–44)
AST: 18 U/L (ref 15–41)
Albumin: 2.4 g/dL — ABNORMAL LOW (ref 3.5–5.0)
Alkaline Phosphatase: 99 U/L (ref 38–126)
Anion gap: 7 (ref 5–15)
BUN: <5 mg/dL — ABNORMAL LOW (ref 8–23)
CO2: 25 mmol/L (ref 22–32)
Calcium: 10.2 mg/dL (ref 8.9–10.3)
Chloride: 101 mmol/L (ref 98–111)
Creatinine, Ser: 0.7 mg/dL (ref 0.44–1.00)
GFR, Estimated: >60 mL/min (ref >60)
Glucose, Bld: 167 mg/dL — ABNORMAL HIGH (ref 70–99)
Potassium: 3.5 mmol/L (ref 3.5–5.1)
Sodium: 133 mmol/L — ABNORMAL LOW (ref 135–145)
Total Bilirubin: 1.2 mg/dL (ref 0.3–1.2)
Total Protein: 5 g/dL — ABNORMAL LOW (ref 6.5–8.1)

## 2021-10-25 LAB — GLUCOSE, CAPILLARY: Glucose-Capillary: 176 mg/dL — ABNORMAL HIGH (ref 70–99)

## 2021-10-25 LAB — MAGNESIUM: Magnesium: 1.9 mg/dL (ref 1.7–2.4)

## 2021-10-25 LAB — BRAIN NATRIURETIC PEPTIDE: B Natriuretic Peptide: 194.5 pg/mL — ABNORMAL HIGH (ref 0.0–100.0)

## 2021-10-25 MED ORDER — PENTIPS 32G X 4 MM MISC
11 refills | Status: DC
  Filled 2021-10-25: qty 200, 30d supply, fill #0

## 2021-10-25 MED ORDER — LEVEMIR FLEXTOUCH 100 UNIT/ML ~~LOC~~ SOPN
22.0000 [IU] | PEN_INJECTOR | Freq: Every day | SUBCUTANEOUS | 0 refills | Status: DC
  Filled 2021-10-25: qty 9, 30d supply, fill #0

## 2021-10-25 MED ORDER — CARVEDILOL 3.125 MG PO TABS
3.1250 mg | ORAL_TABLET | Freq: Two times a day (BID) | ORAL | 0 refills | Status: AC
Start: 2021-10-25 — End: 2022-10-25
  Filled 2021-10-25: qty 60, 30d supply, fill #0

## 2021-10-25 MED ORDER — LEVEMIR FLEXTOUCH 100 UNIT/ML ~~LOC~~ SOPN
18.0000 [IU] | PEN_INJECTOR | Freq: Every day | SUBCUTANEOUS | 0 refills | Status: DC
  Filled 2021-10-25: qty 6, 30d supply, fill #0

## 2021-10-25 MED ORDER — DOCUSATE SODIUM 100 MG PO CAPS
100.0000 mg | ORAL_CAPSULE | Freq: Every day | ORAL | 0 refills | Status: AC
  Filled 2021-10-25: qty 30, 30d supply, fill #0

## 2021-10-25 MED ORDER — POTASSIUM CHLORIDE CRYS ER 20 MEQ PO TBCR
40.0000 meq | EXTENDED_RELEASE_TABLET | Freq: Once | ORAL | Status: AC
  Administered 2021-10-25: 09:00:00 40 meq via ORAL
  Filled 2021-10-25: qty 2

## 2021-10-25 MED ORDER — INSULIN ASPART 100 UNIT/ML FLEXPEN
PEN_INJECTOR | SUBCUTANEOUS | 0 refills | Status: DC
  Filled 2021-10-25: qty 15, 30d supply, fill #0

## 2021-10-25 NOTE — Discharge Instructions (Addendum)
Follow with Primary MD [name], Damaris Hippo, PA-C in 7 days   Get CBC, CMP, 2 view Chest X ray -  checked next visit within 1 week by Primary MD   Activity: As tolerated with Full fall precautions use walker/cane & assistance as needed  Disposition Home   Diet: Heart Healthy Low Carb  Accuchecks 4 times/day, Once in AM empty stomach and then before each meal. Log in all results and show them to your Prim.MD in 3 days. If any glucose reading is under 80 or above 300 call your Prim MD immidiately. Follow Low glucose instructions for glucose under 80 as instructed.   Special Instructions: If you have smoked or chewed Tobacco  in the last 2 yrs please stop smoking, stop any regular Alcohol  and or any Recreational drug use.  On your next visit with your primary care physician please Get Medicines reviewed and adjusted.  Please request your Prim.MD to go over all Hospital Tests and Procedure/Radiological results at the follow up, please get all Hospital records sent to your Prim MD by signing hospital release before you go home.  If you experience worsening of your admission symptoms, develop shortness of breath, life threatening emergency, suicidal or homicidal thoughts you must seek medical attention immediately by calling 911 or calling your MD immediately  if symptoms less severe.  You Must read complete instructions/literature along with all the possible adverse reactions/side effects for all the Medicines you take and that have been prescribed to you. Take any new Medicines after you have completely understood and accpet all the possible adverse reactions/side effects.

## 2021-10-25 NOTE — Discharge Summary (Addendum)
Linda Atkinson FHQ:[id number] DOB: 12/13/1945 DOA: 10/19/2021  PCP: Linda Bale, PA-C  Admit date: 10/19/2021  Discharge date: 10/25/2021  Admitted From: Home   Disposition:  Home   Recommendations for Outpatient Follow-up:   Follow up with PCP in 1-2 weeks  PCP Please obtain BMP/CBC, 2 view CXR in 1week,  (see Discharge instructions)   PCP Please follow up on the following pending results: Monitor blood pressure and glycemic control closely.  Needs outpatient GI follow-up.   Home Health: None   Equipment/Devices: None  Consultations: GI Discharge Condition: Stable    CODE STATUS: Full    Diet Recommendation: Heart Healthy Low Carb    Chief Complaint  Patient presents with   Chest Pain     Brief history of present illness from the day of admission and additional interim summary    Patient is a 75 y.o. female with history of DM-2, HFpEF, hypothyroidism, RA-who presented with approximately 5-day history of abdominal pain, nausea, vomiting-found to have acute pancreatitis.                                                                 Atkinson Course    Acute pancreatitis: Etiology unknown-no history of alcohol use-no gallstones seen on CT abdomen/RUQ ultrasound.  Triglycerides within normal limits.  Zetia now on hold could be the potential culprit.  Seen by GI with supportive care much improved and symptom-free now, IgG4 levels were negative, at this point she will be discharged home with no Zetia and outpatient GI and PCP follow-up.    Constipation.  Placed on bowel regimen, resolved.     Blood in stool - hemorrhoidal, stable H&H, stool softeners, monitor.   Hypokalemia: Replete and recheck   Chronic diastolic heart failure: Euvolemic on exam-hold Lasix-watch closely while on IVF acute  pancreatitis:  Hypertension.  Placed on Coreg.    Hypothyroidism: Continue Synthroid   Rheumatoid arthritis: Does not appear to be on any DMARDs-appears stable   Morbid Obesity - Estimated body mass index is 42.29, follow with PCP.  DM-2 (A1c 9.6): Claims she is only on metformin as an outpatient-poor outpatient control, placed on insulin and sliding scale education provided.  Has testing supplies.  PCP to monitor.  Discharge diagnosis     Principal Problem:   Acute pancreatitis without infection or necrosis Active Problems:   Hypothyroidism (acquired)   Diabetes type 2, controlled ([name])   Rheumatoid arthritis (Linda Atkinson)   Morbid obesity (Linda Atkinson)   Chronic heart failure with preserved ejection fraction (HFpEF) Linda Atkinson)    Discharge instructions    Discharge Instructions     Discharge instructions   Complete by: As directed    Follow with Primary MD Linda Atkinson, Linda Hippo, PA-C in 7 days   Get CBC, CMP, 2 view Chest X ray -  checked next visit within 1 week by Primary MD   Activity: As tolerated with Full fall precautions use walker/cane & assistance as needed  Disposition Home   Diet: Heart Healthy Low Carb  Accuchecks 4 times/day, Once in AM empty stomach and then before each meal. Log in all results and show them to your Prim.MD in 3 days. If any glucose reading is under 80 or above 300 call your Prim MD immidiately. Follow Low glucose instructions for glucose under 80 as instructed.   Special Instructions: If you have smoked or chewed Tobacco  in the last 2 yrs please stop smoking, stop any regular Alcohol  and or any Recreational drug use.  On your next visit with your primary care physician please Get Medicines reviewed and adjusted.  Please request your Prim.MD to go over all Atkinson Tests and Procedure/Radiological results at the follow up, please get all Atkinson records sent to your Prim MD by signing Atkinson release before you go home.  If you experience worsening of  your admission symptoms, develop shortness of breath, life threatening emergency, suicidal or homicidal thoughts you must seek medical attention immediately by calling 911 or calling your MD immediately  if symptoms less severe.  You Must read complete instructions/literature along with all the possible adverse reactions/side effects for all the Medicines you take and that have been prescribed to you. Take any new Medicines after you have completely understood and accpet all the possible adverse reactions/side effects.   Increase activity slowly   Complete by: As directed        Discharge Medications   Allergies as of 10/25/2021       Reactions   Lipitor [atorvastatin] Other (See Comments)   Muscle pain.        Medication List     STOP taking these medications    ezetimibe 10 MG tablet Commonly known as: ZETIA   naproxen sodium 220 MG tablet Commonly known as: ALEVE       TAKE these medications    carvedilol 3.125 MG tablet Commonly known as: Coreg Take 1 tablet (3.125 mg total) by mouth 2 (two) times daily.   docusate sodium 100 MG capsule Commonly known as: Colace Take 1 capsule (100 mg total) by mouth daily.   famotidine 20 MG tablet Commonly known as: PEPCID Take 20 mg by mouth 2 (two) times daily.   furosemide 20 MG tablet Commonly known as: LASIX TAKE 1/2 TABLET(10 MG) BY MOUTH DAILY What changed: See the new instructions.   Levemir FlexTouch 100 UNIT/ML FlexPen Generic drug: insulin detemir Inject 18 Units into the skin daily.   metFORMIN 500 MG tablet Commonly known as: GLUCOPHAGE Take 500 mg by mouth 2 (two) times daily with a meal.   NovoLOG FlexPen 100 UNIT/ML FlexPen Generic drug: insulin aspart Before each meal 3 times a day, 140-199 - 2 units, 200-250 - 4 units, 251-299 - 6 units,  300-349 - 8 units,  350 or above 10 units.   Pentips 32G X 4 MM Misc Generic drug: Insulin Pen Needle Use with levemir and Novolog insulin up to 6 times  daily   Synthroid 175 MCG tablet Generic drug: levothyroxine Take 175 mcg by mouth every morning.   Vitamin D (Ergocalciferol) 1.25 MG (50000 UNIT) Caps capsule Commonly known as: DRISDOL Take 50,000 Units by mouth See admin instructions. Take one capsule (50000 units) by mouth twice weekly - Sunday and Thursday         Follow-up Information  Linda Atkinson, Linda Hippo, PA-C. Schedule an appointment as soon as possible for a visit in 1 week(s).   Specialty: Physician Assistant Contact information: [address] [place] [address] [phone]         Yetta Flock, MD. Schedule an appointment as soon as possible for a visit in 1 week(s).   Specialty: Gastroenterology Contact information: [address] [place] [address] [phone]                 Major procedures and Radiology Reports - PLEASE review detailed and final reports thoroughly  -        DG Chest Port 1V same Day  Result Date: 10/20/2021 CLINICAL DATA:  Dyspnea, chest pain EXAM: PORTABLE CHEST 1 VIEW COMPARISON:  06/10/2020 FINDINGS: Lung volumes are small. Minimal left basilar atelectasis. Right suprahilar opacity likely represents vascular shadow on this semi-erect examination. No pneumothorax or pleural effusion. Cardiac size within normal limits. No acute bone abnormality. Degenerative changes are noted within the right shoulder. IMPRESSION: Right suprahilar opacity likely represents vascular shadow. This could be confirmed with dedicated two view chest radiograph. No radiographic evidence of acute cardiopulmonary disease. Electronically Signed   By: Fidela Salisbury M.D.   On: 10/20/2021 20:05   CT ANGIO CHEST/ABD/PEL FOR DISSECTION W &/OR WO CONTRAST  Result Date: 10/19/2021 CLINICAL DATA:  Back pain, chest pain and abdominal pain for a few days with fatigue. EXAM: CT ANGIOGRAPHY CHEST, ABDOMEN AND PELVIS TECHNIQUE: Non-contrast CT of the chest was initially obtained.  Multidetector CT imaging through the chest, abdomen and pelvis was performed using the standard protocol during bolus administration of intravenous contrast. Multiplanar reconstructed images and MIPs were obtained and reviewed to evaluate the vascular anatomy. CONTRAST:  131mL OMNIPAQUE IOHEXOL 350 MG/ML SOLN COMPARISON:  CT June 17, 2020. FINDINGS: CTA CHEST FINDINGS Cardiovascular: Non contrasted series demonstrates aortic atherosclerosis without intramural hematoma. No thoracic aortic aneurysm or dissection. No central pulmonary embolus on this nondedicated study. Normal size heart. No significant pericardial effusion/thickening. Calcifications of the aortic annulus. Coronary artery calcifications. Mediastinum/Nodes: No pathologically enlarged mediastinal, hilar or axillary lymph nodes. The trachea and esophagus are unremarkable. Lungs/Pleura: No suspicious pulmonary nodules or masses. No pleural effusion. No pneumothorax. No focal airspace consolidation. Musculoskeletal: Advanced right greater than left glenohumeral degenerative change. Thoracic spondylosis. No acute osseous abnormality. Review of the MIP images confirms the above findings. CTA ABDOMEN AND PELVIS FINDINGS VASCULAR Aorta: Calcified noncalcified atherosclerotic plaque. Normal caliber aorta without aneurysm, dissection, vasculitis or significant stenosis. Celiac: Patent without evidence of aneurysm, dissection, vasculitis or significant stenosis. SMA: Patent without evidence of aneurysm, dissection, vasculitis or significant stenosis. Renals: Both renal arteries are patent without evidence of aneurysm, dissection, vasculitis, fibromuscular dysplasia or significant stenosis. IMA: Patent without evidence of aneurysm, dissection, vasculitis or significant stenosis. Inflow: Patent without evidence of aneurysm, dissection, vasculitis or significant stenosis. Veins: No obvious venous abnormality within the limitations of this arterial phase study.  Review of the MIP images confirms the above findings. NON-VASCULAR Hepatobiliary: Diffuse hepatic steatosis. Gallbladder is unremarkable. No biliary ductal dilation. Pancreas: Edematous appearance of the pancreas with peripancreatic fluid, without hypoenhancing areas of the pancreatic parenchyma, walled off fluid collections or pancreatic ductal dilation. Spleen: Within normal limits. Adrenals/Urinary Tract: Bilateral adrenal glands are unremarkable. No hydronephrosis. 1.7 cm left renal cyst. Additional tiny bilateral hypodense renal lesions are technically too small to accurately characterize but statistically likely reflect cysts. Urinary bladder is unremarkable for degree of distension. Stomach/Bowel:  No radiopaque enteric contrast was administered. Stomach is unremarkable for degree of distension. There is inflammation along the first second and third portions of the duodenum favored reactive related to pancreatitis. No pathologic dilation of small or large bowel. The appendix and terminal ileum appear normal. The colon is predominately decompressed limiting evaluation. Lymphatic: No pathologically enlarged abdominal lymph nodes. Reproductive: Status post hysterectomy. No adnexal masses. Other: No pneumoperitoneum.  No walled off fluid collections. Musculoskeletal: Thoracolumbar spondylosis. Degenerative changes bilateral hips. No acute osseous abnormality. Review of the MIP images confirms the above findings. IMPRESSION: 1. No evidence of aortic aneurysm or dissection. 2. Acute interstitial pancreatitis without evidence of pancreatic necrosis or walled off fluid collections. 3. Inflammatory changes along the first second and third portions of the duodenum favored reactive. 4. Diffuse hepatic steatosis. 5.  Aortic Atherosclerosis (ICD10-I70.0). Electronically Signed   By: Dahlia Bailiff M.D.   On: 10/19/2021 14:34   US Abdomen Limited RUQ (LIVER/GB)  Result Date: 10/19/2021 CLINICAL DATA:  Pancreatitis  EXAM: ULTRASOUND ABDOMEN LIMITED RIGHT UPPER QUADRANT COMPARISON:  CT 10/19/2021 FINDINGS: Gallbladder: No gallstones or wall thickening visualized. No sonographic Murphy sign noted by sonographer. Common bile duct: Diameter: 3.5 mm Liver: Slightly echogenic. No focal hepatic abnormality. Portal vein is patent on color Doppler imaging with normal direction of blood flow towards the liver. Other: None. IMPRESSION: 1. Negative for gallstones. 2. Slightly echogenic liver consistent with hepatic steatosis Electronically Signed   By: Donavan Foil M.D.   On: 10/19/2021 20:44     Today   Subjective    Linda Atkinson today has no headache,no chest abdominal pain,no new weakness tingling or numbness, feels much better wants to go home today.     Objective   Blood pressure (!) 174/61, pulse 66, temperature 99 F (37.2 C), temperature source Oral, resp. rate 20, height 5\' 6"  (1.676 m), weight 118.8 kg, SpO2 93 %.   Intake/Output Summary (Last 24 hours) at 10/25/2021 1158 Last data filed at 10/25/2021 0050 Gross per 24 hour  Intake 527.69 ml  Output --  Net 527.69 ml    Exam  Awake Alert, No new F.N deficits, Normal affect Amelia.AT,PERRAL Supple Neck,No JVD, No cervical lymphadenopathy appriciated.  Symmetrical Chest wall movement, Good air movement bilaterally, CTAB RRR,No Gallops,Rubs or new Murmurs, No Parasternal Heave +ve B.Sounds, Abd Soft, Non tender, No organomegaly appriciated, No rebound -guarding or rigidity. No Cyanosis, Clubbing or edema, No new Rash or bruise   Data Review   CBC w Diff:  Lab Results  Component Value Date   WBC 10.2 10/25/2021   HGB 11.2 (L) 10/25/2021   HGB 14.2 05/23/2018   HCT 34.4 (L) 10/25/2021   HCT 42.7 05/23/2018   PLT 213 10/25/2021   PLT 192 05/23/2018   LYMPHOPCT 17 10/25/2021   MONOPCT 8 10/25/2021   EOSPCT 3 10/25/2021   BASOPCT 1 10/25/2021    CMP:  Lab Results  Component Value Date   NA 133 (L) 10/25/2021   NA 136 05/23/2018   K  3.5 10/25/2021   CL 101 10/25/2021   CO2 25 10/25/2021   BUN <5 (L) 10/25/2021   BUN 8 05/23/2018   CREATININE 0.70 10/25/2021   CREATININE 0.71 03/23/2020   PROT 5.0 (L) 10/25/2021   PROT 6.7 05/23/2018   ALBUMIN 2.4 (L) 10/25/2021   ALBUMIN 4.5 05/23/2018   BILITOT 1.2 10/25/2021   BILITOT 0.7 05/23/2018   ALKPHOS 99 10/25/2021   AST 18 10/25/2021   ALT 19 10/25/2021  .  Lab Results  Component Value Date   HGBA1C 9.6 (H) 10/19/2021     Total Time in preparing paper work, data evaluation and todays exam - 55 minutes  Lala Lund M.D on 10/25/2021 at 11:58 AM  [facility]

## 2021-10-25 NOTE — Progress Notes (Signed)
Spoke with Dr. Candiss Norse about patient's non-compliance with Diabetes (see below what I wrote):   Hi, I spoke with the patient and she said she does not feel the need for the Diabetes Educator to come.  She already knows how to check her BG at home.  She said that the patient can tell her what and how to eat, but "she is not going to do that, not for lack of understanding, but just because she does not want to"...I did try paging the educator since they have not shown up.  I am wondering if you mind patient being discharged knowing she states she is going to be non-compliant at least with the diet aspect without educator coming?  Patient is ready to go now.  Dr. Candiss Norse said that is fine- no Diabetes education needed if she is going to be non-compliant, so I educated patient regarding at least checking her BG ACHS and educated the insulin requirements correlating with that.  Showed patient the pen tips and how each works.  Patient verbalized understanding.  Paperwork showed 22 long acting Levimir- double checked with Dr. Candiss Norse because patient has only been receiving 12 units here.  He called me and stated that 18units is what he would prefer, so I educated patient to receive 18units Levimir once daily.    Also went over discharge paperwork, follow up appointments- patient verbalized understanding.  Patient's son brought clothes for patient to wear home, tech wheeled patient down to the main lobby where son took them home.

## 2021-10-25 NOTE — Progress Notes (Addendum)
Spoke with staff RN on the phone about patient going home on insulin. Just received consult for insulin teaching.  She is in the process of reviewing the discharge papers now. Patient was seen by our inpatient team on 12/16. Told staff RN that we would be glad to see her today if needed. Staff RN states that she feels that the patient will do ok with the insulin.  Noted that patient will be discharged on Levemir and Novolog TID. Asked staff RN to verify home dosages with Dr. Candiss Norse before discharge.   Harvel Ricks RN BSN CDE Diabetes Coordinator Pager: [phone]  8am-5pm

## 2021-12-05 ENCOUNTER — Encounter: Payer: Self-pay | Admitting: Gastroenterology

## 2021-12-05 ENCOUNTER — Ambulatory Visit: Payer: PPO | Admitting: Gastroenterology

## 2021-12-05 ENCOUNTER — Other Ambulatory Visit: Payer: PPO

## 2021-12-05 VITALS — BP 124/70 | HR 71 | Ht 66.5 in | Wt 235.0 lb

## 2021-12-05 DIAGNOSIS — R194 Change in bowel habit: Secondary | ICD-10-CM

## 2021-12-05 DIAGNOSIS — Z1211 Encounter for screening for malignant neoplasm of colon: Secondary | ICD-10-CM

## 2021-12-05 DIAGNOSIS — K859 Acute pancreatitis without necrosis or infection, unspecified: Secondary | ICD-10-CM | POA: Diagnosis not present

## 2021-12-05 MED ORDER — SUTAB 1479-225-188 MG PO TABS: 1.0000 | ORAL_TABLET | Freq: Once | ORAL | 0 refills | Status: AC

## 2021-12-05 NOTE — Progress Notes (Signed)
HPI :  76 y/o female with DM, obesity, RA, GERD, who presented in December to the hospital with progressive abdominal pain and nausea.  CT scan was done which showed acute pancreatitis.  Right upper quadrant ultrasound showed no gallstones.  She does not drink any alcohol lipids were normal.  It was unclear cause of this while she was in the hospital.  Zetia was the most recent medicine that was added to her regimen in recent months, there are case reports of this causing pancreatitis but this is not common.  That being said it has been held.  She does not use her Lasix much at baseline.  She improved with conservative/supportive measures and was discharged from the hospital.  Since of last seen her she has been doing okay.  She still has some fleeting pains in her mid abdomen which can come and go.  Occasional nausea but no vomiting.  During her hospitalization and subsequent recovery she was not eating much and she thinks lost about 25 pounds since this episode occurred.  She is really avoiding fatty foods and meats and has altered her diet accordingly.  She otherwise has never had a prior colonoscopy, we discussed this for a bit.  Some days she has some urgency and then has a hard time producing any stool.  Other days she will have loose stools with urgency.  Sometimes her bowels are normal and does not cause her any problem.  She does see questionable oil slick in her stool at times.  Since her hospitalization her bowels have been a bit irregular.  She does not see any blood in the stool.  She has had her metformin increased from baseline dosing as she has since stopped her insulin, but states she has been on metformin for some time and never had a problem within the past.  She is also started Jardiance since her hospitalization.  She denies any cardiopulmonary symptoms.  She has had nuclear stress test last year which was a good study without any high risk findings.   Prior work-up: CT angio C/A/P  10/19/21 - IMPRESSION: 1. No evidence of aortic aneurysm or dissection. 2. Acute interstitial pancreatitis without evidence of pancreatic necrosis or walled off fluid collections. 3. Inflammatory changes along the first second and third portions of the duodenum favored reactive. 4. Diffuse hepatic steatosis. 5.  Aortic Atherosclerosis (ICD10-I70.0).  RUQ [place] 10/19/21: IMPRESSION: 1. Negative for gallstones. 2. 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 26 2013: "blood pressure dropped"    Depression    moderate   Diabetes ([place])    GERD (gastroesophageal reflux disease) 1996   hx of   Hypothyroidism    IBS (irritable bowel syndrome)    Joint pain    Pneumonia 2009   hx of   Pre-diabetes    Rheumatoid arthritis ([facility])    Shortness of breath    with walking   SOB (shortness of breath)    Thyroid disease    Vitamin D deficiency      Past Surgical History:  Procedure Laterality Date   ABDOMINAL HYSTERECTOMY  1977   cataract surgery  Bilateral 10/2015   cyst removed Left 1962   knee   DILATION AND CURETTAGE OF UTERUS  1972   PARATHYROIDECTOMY Right 07/13/2013   Procedure: RIGHT INFERIOR PARATHYROIDECTOMY;  Surgeon: Ralene Ok, MD;  Location: [facility];  Service: General;  Laterality: Right;   right shoulder rotater cuff Right 2013   x2   Family History  Problem Relation Age of Onset   Heart disease Father    Diabetes Father    Hyperlipidemia Father    Kidney disease Father    Alzheimer's disease Mother    High blood pressure Mother    Thyroid disease Mother    Cancer Brother        prostate   Alzheimer's disease Brother    Cancer Maternal Aunt        breast   Cancer Paternal Aunt         uterine   Cancer Paternal Grandmother        colon   Healthy Son    Social History   Tobacco Use   Smoking status: Former    Packs/day: 0.50    Years: 50.00    Pack years: 25.00    Types: Cigarettes    Quit date: 02/16/2020    Years since quitting: 1.8   Smokeless tobacco: Never  Vaping Use   Vaping Use: Never used  Substance Use Topics   Alcohol use: No    Alcohol/week: 0.0 standard drinks   Drug use: No   Current Outpatient Medications  Medication Sig Dispense Refill   carvedilol (COREG) 3.125 MG tablet Take 1 tablet (3.125 mg total) by mouth 2 (two) times daily. 60 tablet 0   famotidine (PEPCID) 20 MG tablet Take 20 mg by mouth 2 (two) times daily.     furosemide (LASIX) 20 MG tablet Take 40 mg by mouth.     JARDIANCE 10 MG TABS tablet Take 10 mg by mouth daily.     metFORMIN (GLUCOPHAGE) 500 MG tablet Take 1,000 mg by mouth 2 (two) times daily with a meal.     SYNTHROID 175 MCG tablet Take 175 mcg by mouth every morning. Uses Brand only     Vitamin D, Ergocalciferol, (DRISDOL) 1.25 MG (50000 UNIT) CAPS capsule Take 50,000 Units by mouth See admin instructions. Take one capsule (50000 units) by mouth twice weekly - Sunday and Thursday     No current facility-administered medications for this visit.   Allergies  Allergen Reactions   Lipitor [Atorvastatin] Other (See Comments)    Muscle pain.     Review of Systems: All systems reviewed and negative except where noted in HPI.   Lab Results  Component Value Date   ALT 19 10/25/2021   AST 18 10/25/2021   ALKPHOS 99 10/25/2021   BILITOT 1.2 10/25/2021     Physical Exam: BP 124/70    Pulse 71    Ht 5' 6.5" (1.689 m)    Wt 235 lb (106.6 kg) Comment: per patient   BMI 37.36 kg/m  Constitutional: Pleasant,well-developed, female in no acute distress. Abdominal: Soft, nondistended, mild mid abdominal TTP, there are no masses palpable. Extremities: no edema Neurological: Alert and oriented to person place and  time. Skin: Skin is warm and dry. No rashes noted. Psychiatric: Normal mood and affect. Behavior is normal.   ASSESSMENT AND PLAN: 76 year old female here for reassessment of the following:  Acute pancreatitis - unclear etiology Altered bowel habits Colon cancer screening  As above, admitted with acute pancreatitis of unclear etiology  in December.  She responded to conservative measures and has recovered.  Still has some mild soreness at times, eating low-fat diet.  She has stopped Zetia although unclear if that played a role in this presentation or not.  Given we do not know what caused this, I have recommended an MRCP to reassess her pancreas once her inflammation has resolved, make sure no subtle mass lesion.  We will tentatively plan on doing this in late February or March, she agrees.  Otherwise she has had some altered bowel habits since her hospitalization as above, ? Oil slick in stools.  We will check pancreatic elastase to make sure no pancreatic exocrine insufficiency following her episode of pancreatitis.  Recommend Citrucel once daily to see if we can provide some regularity to her bowels.  She has never had a colonoscopy in the past and we discussed at her age if she wanted to do this.  I do think it would be useful to have a screening exam done especially in light of her symptoms.  We discussed risks and benefits of colonoscopy and anesthesia and she wants to proceed.  Further recommendations pending results.  She would like to do this in March as well.  Plan: - fecal pancreatic elastase stool test, once submitted, will start Citrucel once daily - schedule colonoscopy  - schedule MRCP to re-evaluate pancreas end of Feb - low fat diet - contact me with any recurrence of symptoms of pancreatitis in the interim  Jolly Mango, MD [facility]

## 2021-12-05 NOTE — Patient Instructions (Signed)
If you are age 75 or older, your body mass index should be between 23-30. Your Body mass index is 37.36 kg/m. If this is out of the aforementioned range listed, please consider follow up with your Primary Care Provider.  If you are age 74 or younger, your body mass index should be between 19-25. Your Body mass index is 37.36 kg/m. If this is out of the aformentioned range listed, please consider follow up with your Primary Care Provider.   ________________________________________________________  The [facility] providers would like to encourage you to use [facility] to communicate with providers for non-urgent requests or questions.  Due to long hold times on the telephone, sending your provider a message by [facility] may be a faster and more efficient way to get a response.  Please allow 48 business hours for a response.  Please remember that this is for non-urgent requests.  _______________________________________________________  Linda Atkinson have been scheduled for a colonoscopy. Please follow written instructions given to you at your visit today.  Please pick up your prep supplies at the pharmacy within the next 1-3 days. If you use inhalers (even only as needed), please bring them with you on the day of your procedure.  Please go to the lab in the basement of our building to have lab work done as you leave today. Hit "B" for basement when you get on the elevator.  When the doors open the lab is on your left.  We will call you with the results. Thank you.  Start Citrucel, over-the-counter, once daily AFTER you submit your stool test sample to the lab.   You have been scheduled for an MRCP at  on Monday, 2-27. Your appointment time is 10:00am. Please arrive to admitting (at main entrance of the hospital) 30 minutes prior to your appointment time for registration purposes. Please make certain not to have anything to eat or drink 6 hours prior to your test. In addition, if you have any metal in your body,  have a pacemaker or defibrillator, please be sure to let your ordering physician know. This test typically takes 45 minutes to 1 hour to complete. Should you need to reschedule, please call [phone] to do so.  Thank you for entrusting me with your care and for choosing [facility], Dr. Springdale Cellar

## 2021-12-06 ENCOUNTER — Encounter: Payer: Self-pay | Admitting: Gastroenterology

## 2021-12-07 ENCOUNTER — Other Ambulatory Visit: Payer: PPO

## 2021-12-07 DIAGNOSIS — K859 Acute pancreatitis without necrosis or infection, unspecified: Secondary | ICD-10-CM

## 2021-12-07 DIAGNOSIS — Z1211 Encounter for screening for malignant neoplasm of colon: Secondary | ICD-10-CM

## 2021-12-07 DIAGNOSIS — R194 Change in bowel habit: Secondary | ICD-10-CM

## 2021-12-13 ENCOUNTER — Encounter: Payer: Self-pay | Admitting: Gastroenterology

## 2021-12-14 LAB — PANCREATIC ELASTASE, FECAL: Pancreatic Elastase-1, Stool: 95 mcg/g — ABNORMAL LOW

## 2021-12-15 ENCOUNTER — Other Ambulatory Visit: Payer: Self-pay

## 2021-12-15 MED ORDER — PANCRELIPASE (LIP-PROT-AMYL) 36000-114000 UNITS PO CPEP: 72000.0000 [IU] | ORAL_CAPSULE | Freq: Three times a day (TID) | ORAL | 2 refills | Status: AC

## 2022-01-01 ENCOUNTER — Other Ambulatory Visit: Payer: Self-pay | Admitting: Gastroenterology

## 2022-01-01 ENCOUNTER — Ambulatory Visit (HOSPITAL_COMMUNITY)
Admission: RE | Admit: 2022-01-01 | Discharge: 2022-01-01 | Disposition: A | Discharge status: Home / Self Care | Payer: PPO | Source: Ambulatory Visit | Attending: Gastroenterology | Admitting: Gastroenterology

## 2022-01-01 DIAGNOSIS — R194 Change in bowel habit: Secondary | ICD-10-CM

## 2022-01-01 DIAGNOSIS — Z1211 Encounter for screening for malignant neoplasm of colon: Secondary | ICD-10-CM | POA: Diagnosis present

## 2022-01-01 DIAGNOSIS — K859 Acute pancreatitis without necrosis or infection, unspecified: Secondary | ICD-10-CM

## 2022-01-01 MED ORDER — GADOBUTROL 1 MMOL/ML IV SOLN
10.0000 mL | Freq: Once | INTRAVENOUS | Status: AC | PRN
  Administered 2022-01-01: 10 mL via INTRAVENOUS

## 2022-01-09 ENCOUNTER — Encounter: Payer: PPO | Admitting: Gastroenterology

## 2022-02-02 ENCOUNTER — Ambulatory Visit: Payer: PPO | Admitting: Gastroenterology

## 2022-02-22 ENCOUNTER — Ambulatory Visit: Payer: PPO | Admitting: Gastroenterology

## 2022-03-08 IMAGING — DX DG CHEST 2V
2 series · 2 of 2 positions shown · non-contrast
Comparison: October 19, 2007

CLINICAL DATA: Dyspnea.

EXAM:
CHEST - 2 VIEW

[chest pa]
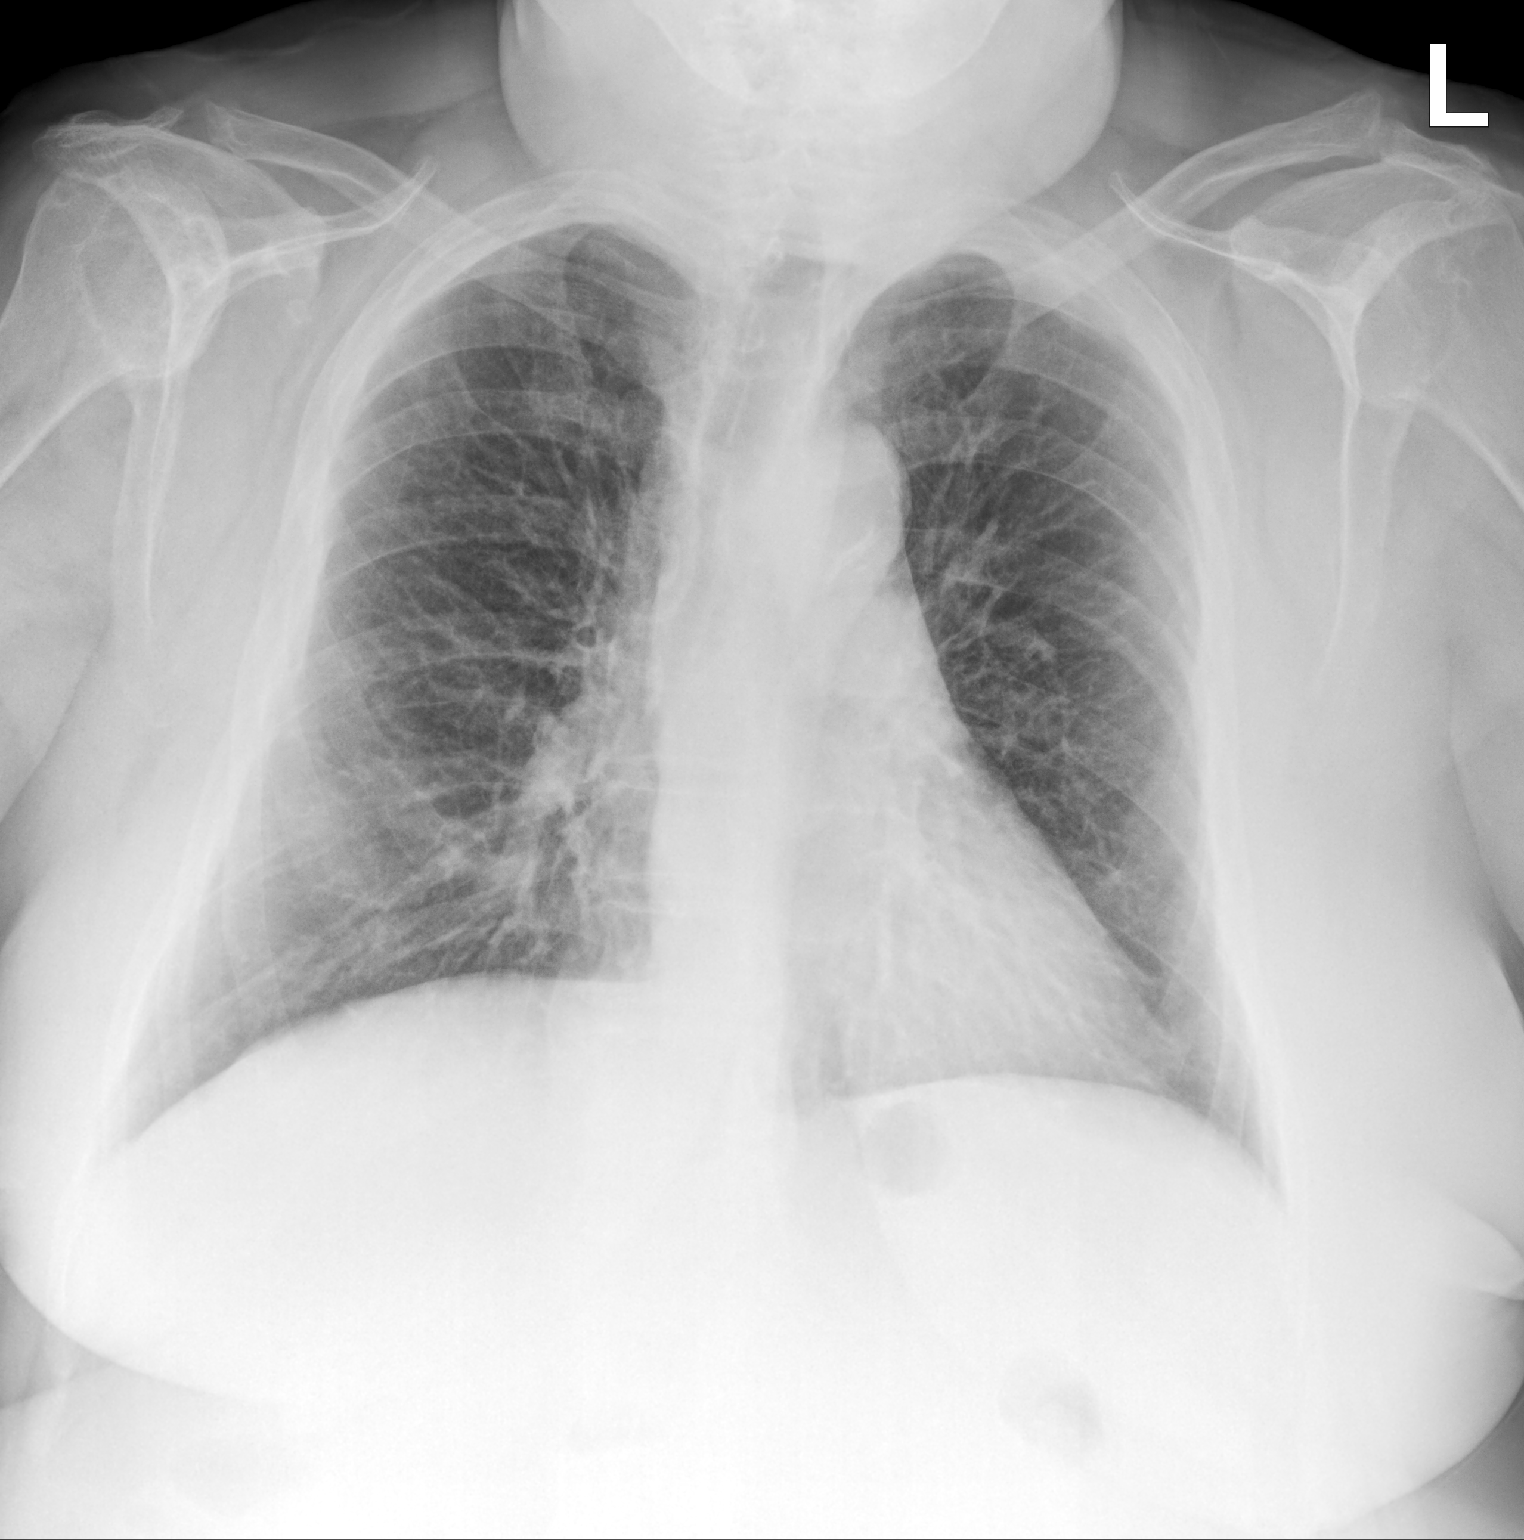

[chest lat]
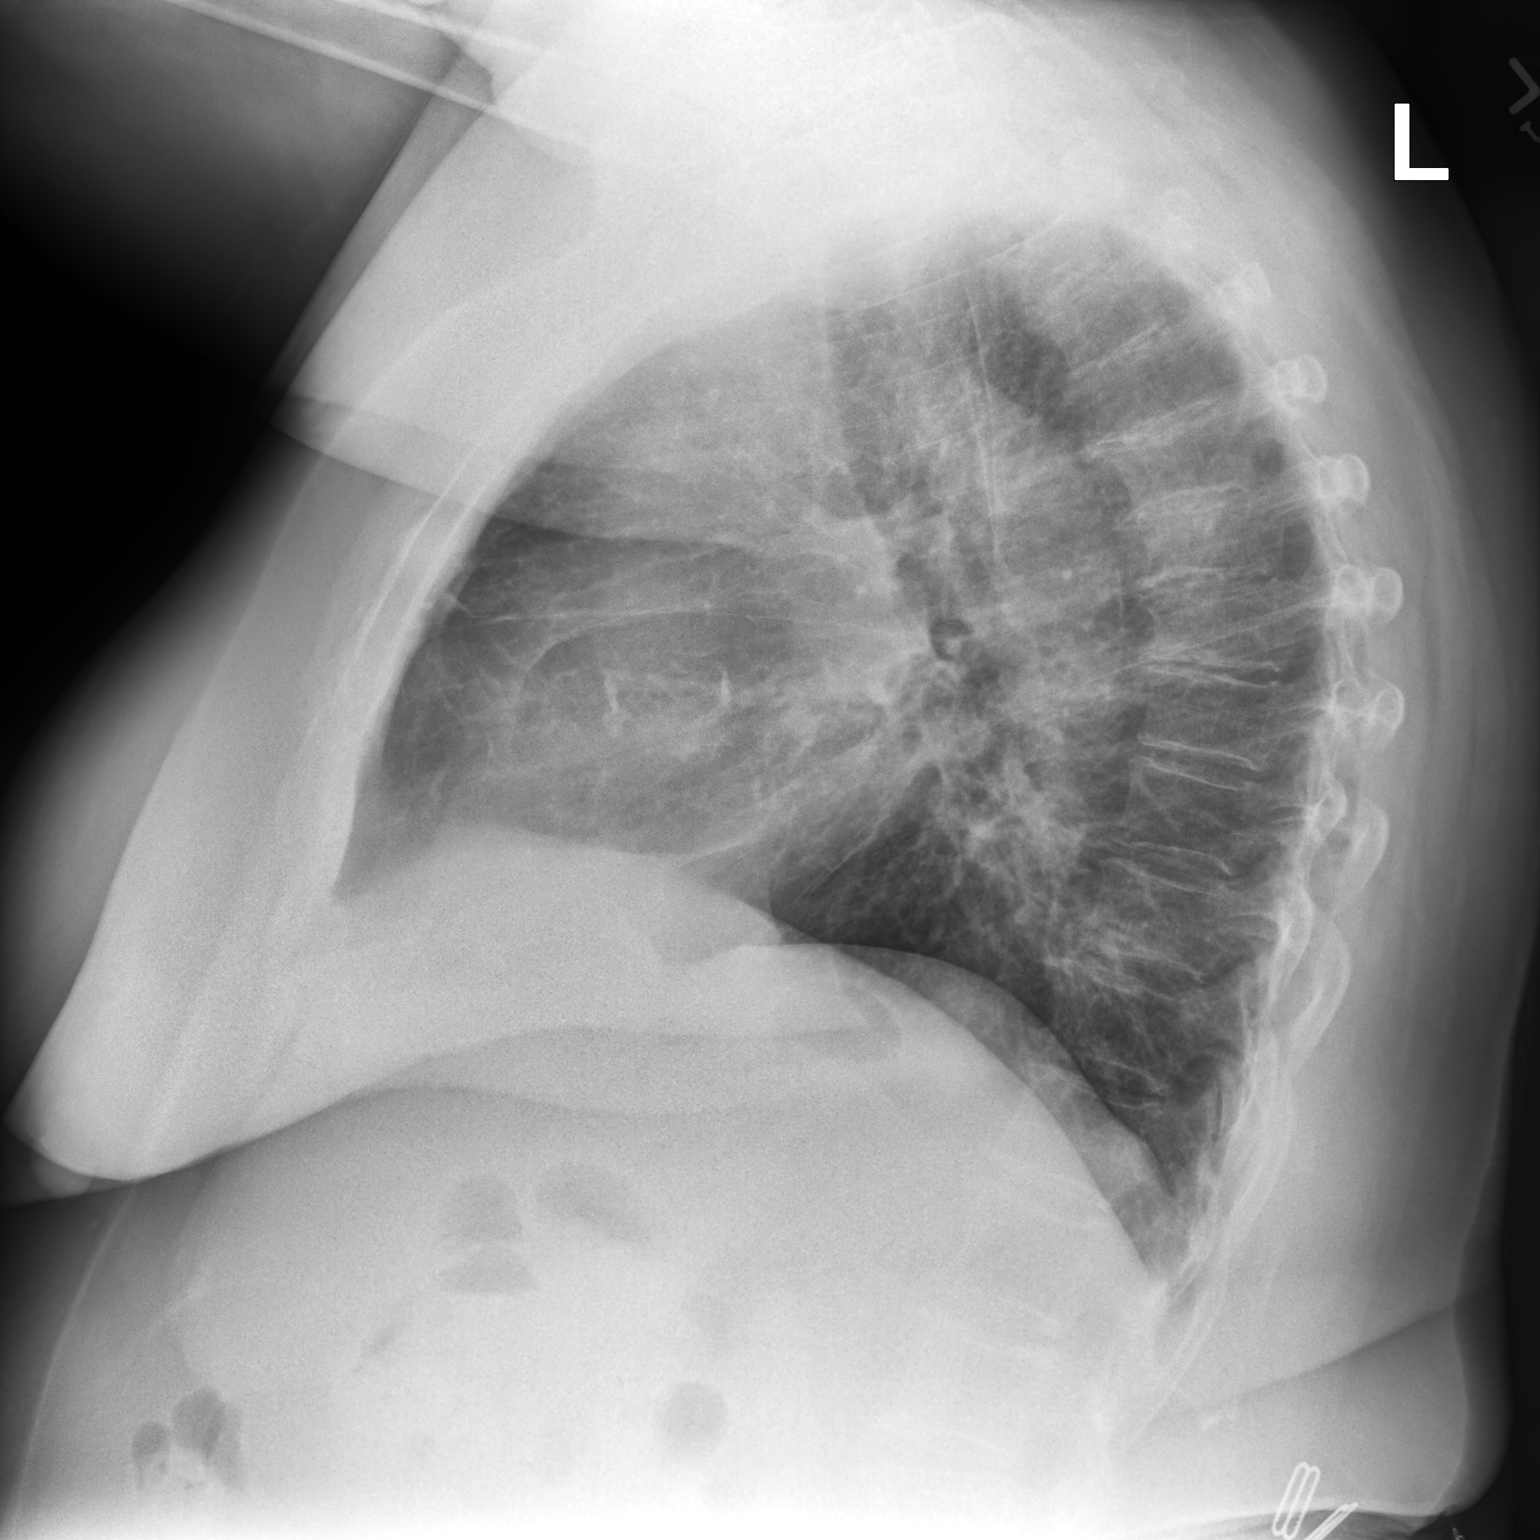

[2 of 2 positions shown; findings below may reference images not displayed]

FINDINGS: Mild to moderate severity diffusely increased interstitial lung
markings are noted. There is no evidence of a pleural effusion or
pneumothorax. The heart size and mediastinal contours are within
normal limits. There is moderate severity calcification of the
aortic arch. The visualized skeletal structures are unremarkable.
IMPRESSION: Mild to moderate severity diffusely increased interstitial lung
markings, consistent with interstitial edema.

## 2022-05-07 ENCOUNTER — Telehealth: Payer: Self-pay

## 2022-05-07 DIAGNOSIS — K863 Pseudocyst of pancreas: Secondary | ICD-10-CM

## 2022-05-07 NOTE — Telephone Encounter (Signed)
Lm on vm for patient to return call.  MRCP order in epic. Secure staff message sent to radiology scheduling to contact pt to set up her appt.

## 2022-05-07 NOTE — Telephone Encounter (Signed)
-----   Message from Yevette Edwards, RN sent at 01/03/2022 10:56 AM EST ----- Regarding: 51-monthMRCP MR ABDOMEN MRCP W WO CONTAST - Need to enter order RE: pancreatic pseudocysts, acute pancreatitis

## 2022-05-10 NOTE — Telephone Encounter (Signed)
Called and spoke with patient. Pt reports that she scheduled her MRCP for 05/21/22. Pt stated that she was going to cancel the appt because she did not know what it was for. I did explain to patient the point of the repeat MRCP was to check for resolution of the pseudocysts that were on her pancreas. Pt wanted to know if she really needed to have this done. I told her yes, radiology recommended repeat in 4-6 months, especially with her history of pancreatitis. Pt stated that she would keep the appt. She knows that we will be in contact regarding her results and recommendations. Pt verbalized understanding and had no concerns at the end of the call.

## 2022-05-21 ENCOUNTER — Ambulatory Visit (HOSPITAL_COMMUNITY): Admission: RE | Admit: 2022-05-21 | Payer: PPO | Source: Ambulatory Visit

## 2022-06-04 ENCOUNTER — Encounter: Payer: Self-pay | Admitting: Gastroenterology

## 2022-06-07 ENCOUNTER — Ambulatory Visit (HOSPITAL_COMMUNITY): Payer: PPO

## 2022-06-07 ENCOUNTER — Encounter (HOSPITAL_COMMUNITY): Payer: Self-pay

## 2022-06-13 ENCOUNTER — Encounter (INDEPENDENT_AMBULATORY_CARE_PROVIDER_SITE_OTHER): Payer: Self-pay

## 2022-07-12 ENCOUNTER — Ambulatory Visit (HOSPITAL_COMMUNITY): Admission: RE | Admit: 2022-07-12 | Payer: PPO | Source: Ambulatory Visit

## 2022-07-12 ENCOUNTER — Other Ambulatory Visit: Payer: Self-pay | Admitting: Gastroenterology

## 2022-07-12 DIAGNOSIS — K863 Pseudocyst of pancreas: Secondary | ICD-10-CM

## 2023-12-02 ENCOUNTER — Ambulatory Visit (INDEPENDENT_AMBULATORY_CARE_PROVIDER_SITE_OTHER): Payer: Self-pay | Admitting: Psychiatry

## 2023-12-02 DIAGNOSIS — F4321 Adjustment disorder with depressed mood: Secondary | ICD-10-CM | POA: Diagnosis not present

## 2023-12-02 DIAGNOSIS — Z62820 Parent-biological child conflict: Secondary | ICD-10-CM | POA: Diagnosis not present
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
## 2024-01-01 ENCOUNTER — Ambulatory Visit (INDEPENDENT_AMBULATORY_CARE_PROVIDER_SITE_OTHER): Payer: PPO | Admitting: Psychiatry

## 2024-01-01 DIAGNOSIS — Z62820 Parent-biological child conflict: Secondary | ICD-10-CM

## 2024-01-01 DIAGNOSIS — F4321 Adjustment disorder with depressed mood: Secondary | ICD-10-CM
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
## 2024-01-15 ENCOUNTER — Ambulatory Visit: Payer: Self-pay | Admitting: General Surgery

## 2024-01-15 ENCOUNTER — Encounter (HOSPITAL_COMMUNITY): Payer: Self-pay

## 2024-01-15 NOTE — Progress Notes (Signed)
 Surgical Instructions    Your procedure is scheduled on January 21, 2024.  Report to [facility] Main Entrance "A" at 1:30 P.M., then check in with the Admitting office.  Call this number if you have problems the morning of surgery:  [phone]  If you have any questions prior to your surgery date call [phone]: Open Monday-Friday 8am-4pm If you experience any cold or flu symptoms such as cough, fever, chills, shortness of breath, etc. between now and your scheduled surgery, please notify us at the above number.     Remember:  Do not eat after midnight the night before your surgery  You may drink clear liquids until 12:30 noon of your surgery.   Clear liquids allowed are: Water, Non-Citrus Juices (without pulp), Carbonated Beverages, Clear Tea, Black Coffee Only (NO MILK, CREAM OR POWDERED CREAMER of any kind), and Gatorade.    Take these medicines the morning of surgery with A SIP OF WATER  carvedilol (COREG)  levothyroxine (SYNTHROID)   As of today, STOP taking any Aspirin (unless otherwise instructed by your surgeon) Aleve, Naproxen, Ibuprofen, Motrin, Advil, Goody's, BC's, all herbal medications, fish oil, and all vitamins.                    WHAT DO I DO ABOUT MY DIABETES MEDICATION?   Do not take oral diabetes medicines metFORMIN (GLUCOPHAGE-XR)  the morning of surgery.  MOUNJARO Last dose:  The day of surgery, do not take other diabetes injectables, including Byetta (exenatide), Bydureon (exenatide ER), Victoza (liraglutide), or Trulicity (dulaglutide).  If your CBG is greater than 220 mg/dL, you may take  of your sliding scale (correction) dose of insulin.   HOW TO MANAGE YOUR DIABETES BEFORE AND AFTER SURGERY  Why is it important to control my blood sugar before and after surgery? Improving blood sugar levels before and after surgery helps healing and can limit problems. A way of improving blood sugar control is eating a healthy diet by:  Eating less sugar and  carbohydrates  Increasing activity/exercise  Talking with your doctor about reaching your blood sugar goals High blood sugars (greater than 180 mg/dL) can raise your risk of infections and slow your recovery, so you will need to focus on controlling your diabetes during the weeks before surgery. Make sure that the doctor who takes care of your diabetes knows about your planned surgery including the date and location.  How do I manage my blood sugar before surgery? Check your blood sugar at least 4 times a day, starting 2 days before surgery, to make sure that the level is not too high or low.  Check your blood sugar the morning of your surgery when you wake up and every 2 hours until you get to the Short Stay unit.  If your blood sugar is less than 70 mg/dL, you will need to treat for low blood sugar: Do not take insulin. Treat a low blood sugar (less than 70 mg/dL) with  cup of clear juice (cranberry or apple), 4 glucose tablets, OR glucose gel. Recheck blood sugar in 15 minutes after treatment (to make sure it is greater than 70 mg/dL). If your blood sugar is not greater than 70 mg/dL on recheck, call [phone] for further instructions. Report your blood sugar to the short stay nurse when you get to Short Stay.  If you are admitted to the hospital after surgery: Your blood sugar will be checked by the staff and you will probably be given insulin after  surgery (instead of oral diabetes medicines) to make sure you have good blood sugar levels. The goal for blood sugar control after surgery is 80-180 mg/dL.  Do NOT Smoke (Tobacco/Vaping) for 24 hours prior to your procedure.  If you use a CPAP at night, you may bring your mask/headgear for your overnight stay.   Contacts, glasses, piercing's, hearing aid's, dentures or partials may not be worn into surgery, please bring cases for these belongings.    For patients admitted to the hospital, discharge time will be determined by your  treatment team.   Patients discharged the day of surgery will not be allowed to drive home, and someone needs to stay with them for 24 hours.  SURGICAL WAITING ROOM VISITATION Patients having surgery or a procedure may have no more than 2 support people in the waiting area - these visitors may rotate.   Children under the age of 84 must have an adult with them who is not the patient. If the patient needs to stay at the hospital during part of their recovery, the visitor guidelines for inpatient rooms apply. Pre-op nurse will coordinate an appropriate time for 1 support person to accompany patient in pre-op.  This support person may not rotate.   Please refer to the [facility] website for the visitor guidelines for Inpatients (after your surgery is over and you are in a regular room).    Special instructions:   [place]- Preparing For Surgery  Before surgery, you can play an important role. Because skin is not sterile, your skin needs to be as free of germs as possible. You can reduce the number of germs on your skin by washing with CHG (chlorahexidine gluconate) Soap before surgery.  CHG is an antiseptic cleaner which kills germs and bonds with the skin to continue killing germs even after washing.    Oral Hygiene is also important to reduce your risk of infection.  Remember - BRUSH YOUR TEETH THE MORNING OF SURGERY WITH YOUR REGULAR TOOTHPASTE  Please do not use if you have an allergy to CHG or antibacterial soaps. If your skin becomes reddened/irritated stop using the CHG.  Do not shave (including legs and underarms) for at least 48 hours prior to first CHG shower. It is OK to shave your face.  Please follow these instructions carefully.   Shower the NIGHT BEFORE SURGERY and the MORNING OF SURGERY  If you chose to wash your hair, wash your hair first as usual with your normal shampoo.  After you shampoo, rinse your hair and body thoroughly to remove the shampoo.  Use CHG Soap as  you would any other liquid soap. You can apply CHG directly to the skin and wash gently with a scrungie or a clean washcloth.   Apply the CHG Soap to your body ONLY FROM THE NECK DOWN.  Do not use on open wounds or open sores. Avoid contact with your eyes, ears, mouth and genitals (private parts). Wash Face and genitals (private parts)  with your normal soap.   Wash thoroughly, paying special attention to the area where your surgery will be performed.  Thoroughly rinse your body with warm water from the neck down.  DO NOT shower/wash with your normal soap after using and rinsing off the CHG Soap.  Pat yourself dry with a CLEAN TOWEL.  Wear CLEAN PAJAMAS to bed the night before surgery  Place CLEAN SHEETS on your bed the night before your surgery  DO NOT SLEEP WITH PETS.  Day of Surgery: Take a shower with CHG soap. Do not wear jewelry or makeup Do not wear lotions, powders, perfumes/colognes, or deodorant. Do not shave 48 hours prior to surgery.  Men may shave face and neck. Do not bring valuables to the hospital.  [facility] is not responsible for any belongings or valuables. Do not wear nail polish, gel polish, artificial nails, or any other type of covering on natural nails (fingers and toes) If you have artificial nails or gel coating that need to be removed by a nail salon, please have this removed prior to surgery. Artificial nails or gel coating may interfere with anesthesia's ability to adequately monitor your vital signs. Wear Clean/Comfortable clothing the morning of surgery Remember to brush your teeth WITH YOUR REGULAR TOOTHPASTE.   Please read over the following fact sheets that you were given.    If you received a COVID test during your pre-op visit  it is requested that you wear a mask when out in public, stay away from anyone that may not be feeling well and notify your surgeon if you develop symptoms. If you have been in contact with anyone that has tested  positive in the last 10 days please notify you surgeon.

## 2024-01-15 NOTE — Progress Notes (Signed)
 Surgical Instructions    Your procedure is scheduled on Tuesday, January 21, 2024.  Report to [facility] Main Entrance "A" at 1:30 P.M., then check in with the Admitting office.  Call this number if you have problems the morning of surgery:  [phone]  If you have any questions prior to your surgery date call [phone]: Open Monday-Friday 8am-4pm If you experience any cold or flu symptoms such as cough, fever, chills, shortness of breath, etc. between now and your scheduled surgery, please notify us at the above number.     Remember:  Do not eat after midnight the night before your surgery-Mon.  You may drink clear liquids until 12:30 noon of your surgery-Tues.   Clear liquids allowed are: Water, Non-Citrus Juices (without pulp), Carbonated Beverages, Clear Tea, Black Coffee Only (NO MILK, CREAM OR POWDERED CREAMER of any kind), and Gatorade.   Please complete your PRE-SURGERY G2 Drink that was provided to you by 12:30 PM, the day of surgery.  Please, if able, drink it in one setting. DO NOT SIP.  This will be the last thing that you will drink on day of surgery.   Take these medicines the morning of surgery with A SIP OF WATER  carvedilol (COREG)  levothyroxine (SYNTHROID)    As of today, STOP taking any Aspirin (unless otherwise instructed by your surgeon) Aleve, Naproxen, Ibuprofen, Motrin, Advil, Goody's, BC's, all herbal medications, fish oil, and all vitamins.                    WHAT DO I DO ABOUT MY DIABETES MEDICATION?   Do not take oral diabetes medicines metFORMIN (GLUCOPHAGE-XR)  the morning of surgery.  MOUNJARO Last weekly dose was on    HOW TO MANAGE YOUR DIABETES BEFORE AND AFTER SURGERY  Why is it important to control my blood sugar before and after surgery? Improving blood sugar levels before and after surgery helps healing and can limit problems. A way of improving blood sugar control is eating a healthy diet by:  Eating less sugar and carbohydrates   Increasing activity/exercise  Talking with your doctor about reaching your blood sugar goals High blood sugars (greater than 180 mg/dL) can raise your risk of infections and slow your recovery, so you will need to focus on controlling your diabetes during the weeks before surgery. Make sure that the doctor who takes care of your diabetes knows about your planned surgery including the date and location.  How do I manage my blood sugar before surgery? Check your blood sugar at least 4 times a day, starting 2 days before surgery, to make sure that the level is not too high or low.  Check your blood sugar the morning of your surgery when you wake up and every 2 hours until you get to the Short Stay unit.  If your blood sugar is less than 70 mg/dL, you will need to treat for low blood sugar: Treat a low blood sugar (less than 70 mg/dL) with  cup of clear juice (cranberry or apple), 4 glucose tablets, OR glucose gel. Recheck blood sugar in 15 minutes after treatment (to make sure it is greater than 70 mg/dL). If your blood sugar is not greater than 70 mg/dL on recheck, call [phone] for further instructions. Report your blood sugar to the short stay nurse when you get to Short Stay.  If you are admitted to the hospital after surgery: Your blood sugar will be checked by the staff and you will  probably be given insulin after surgery (instead of oral diabetes medicines) to make sure you have good blood sugar levels. The goal for blood sugar control after surgery is 80-180 mg/dL.   Do NOT Smoke (Tobacco/Vaping) for 24 hours prior to your procedure.   Contacts, glasses, piercing's, hearing aid's, dentures or partials may not be worn into surgery, please bring cases for these belongings.    Patients discharged the day of surgery will not be allowed to drive home, and someone needs to stay with them for 24 hours.  SURGICAL WAITING ROOM VISITATION Patients having surgery or a procedure may have no  more than 2 support people in the waiting area - these visitors may rotate.   Children under the age of 45 must have an adult with them who is not the patient. If the patient needs to stay at the hospital during part of their recovery, the visitor guidelines for inpatient rooms apply. Pre-op nurse will coordinate an appropriate time for 1 support person to accompany patient in pre-op.  This support person may not rotate.   Please refer to the [facility] website for the visitor guidelines for Inpatients (after your surgery is over and you are in a regular room).    Special instructions:   [place]- Preparing For Surgery  Before surgery, you can play an important role. Because skin is not sterile, your skin needs to be as free of germs as possible. You can reduce the number of germs on your skin by washing with CHG (chlorahexidine gluconate) Soap before surgery.  CHG is an antiseptic cleaner which kills germs and bonds with the skin to continue killing germs even after washing.    Oral Hygiene is also important to reduce your risk of infection.  Remember - BRUSH YOUR TEETH THE MORNING OF SURGERY WITH YOUR REGULAR TOOTHPASTE  Please do not use if you have an allergy to CHG or antibacterial soaps. If your skin becomes reddened/irritated stop using the CHG.  Do not shave (including legs and underarms) for at least 48 hours prior to first CHG shower. It is OK to shave your face.  Please follow these instructions carefully.   Shower the [employer] [facility]-Mon and the MORNING OF SURGERY-Tues.  If you chose to wash your hair, wash your hair first as usual with your normal shampoo.  After you shampoo, rinse your hair and body thoroughly to remove the shampoo.  Use CHG Soap as you would any other liquid soap. You can apply CHG directly to the skin and wash gently with a scrungie or a clean washcloth.   Apply the CHG Soap to your body ONLY FROM THE NECK DOWN.  Do not use on open wounds or open  sores. Avoid contact with your eyes, ears, mouth and genitals (private parts). Wash Face and genitals (private parts)  with your normal soap.   Wash thoroughly, paying special attention to the area where your surgery will be performed.  Thoroughly rinse your body with warm water from the neck down.  DO NOT shower/wash with your normal soap after using and rinsing off the CHG Soap.  Pat yourself dry with a CLEAN TOWEL.  Wear CLEAN PAJAMAS to bed the night before surgery  Place CLEAN SHEETS on your bed the night before your surgery  DO NOT SLEEP WITH PETS.   Day of Surgery: Take a shower with CHG soap. Do not wear jewelry or makeup Do not wear lotions, powders, perfumes/colognes, or deodorant. Do not shave 48 hours  prior to surgery.  Men may shave face and neck. Do not bring valuables to the hospital.  [facility] is not responsible for any belongings or valuables. Do not wear nail polish, gel polish, artificial nails, or any other type of covering on natural nails (fingers and toes) If you have artificial nails or gel coating that need to be removed by a nail salon, please have this removed prior to surgery. Artificial nails or gel coating may interfere with anesthesia's ability to adequately monitor your vital signs. Wear Clean/Comfortable clothing the morning of surgery Remember to brush your teeth WITH YOUR REGULAR TOOTHPASTE.   Please read over the following fact sheets that you were given.

## 2024-01-15 NOTE — Progress Notes (Incomplete)
 PCP - Benny Lennert, PA Cardiologist - none  Chest x-ray - n/a EKG - 01/16/24 Stress Test - 05/10/21 ECHO - 06/20/20 Cardiac Cath - n/a  ICD Pacemaker/Loop - n/a  Sleep Study -  Yes, 07/12/20  CPAP - does not use CPAP  Diabetes Type 2 Fasting Blood Sugar - 90s-120s Checks Blood Sugar __1___ time a day  Last dose of GLP1 Mounjaro was on 01/15/24   GLP1 instructions: do not take until after surgery.  Do not take Metformin on the morning of surgery.  If your blood sugar is less than 70 mg/dL, you will need to treat for low blood sugar: Treat a low blood sugar (less than 70 mg/dL) with  cup of clear juice (cranberry or apple), 4 glucose tablets, OR glucose gel. Recheck blood sugar in 15 minutes after treatment (to make sure it is greater than 70 mg/dL). If your blood sugar is not greater than 70 mg/dL on recheck, call [phone] for further instructions.  Aspirin and Blood Thinner Instructions:  n/a  ERAS - clear liquids til 12:30 PM DOS PRE-SURGERY G2 drink given at PAT appt with instructions.   Anesthesia review: Yes  STOP now taking any Aspirin (unless otherwise instructed by your surgeon), Aleve, Naproxen, Ibuprofen, Motrin, Advil, Goody's, BC's, all herbal medications, fish oil, and all vitamins.   Coronavirus Screening Do you have any of the following symptoms:  Cough yes/no: No Fever (>100.63F)  yes/no: No Runny nose yes/no: No Sore throat yes/no: No Difficulty breathing/shortness of breath  yes/no: No  Have you traveled in the last 14 days and where? yes/no: No  Patient verbalized understanding of instructions that were given to them at the PAT appointment. Patient was also instructed that they will need to review over the PAT instructions again at home before surgery.

## 2024-01-16 ENCOUNTER — Encounter (HOSPITAL_COMMUNITY): Payer: Self-pay

## 2024-01-16 ENCOUNTER — Encounter (HOSPITAL_COMMUNITY)
Admission: RE | Admit: 2024-01-16 | Discharge: 2024-01-16 | Disposition: A | Discharge status: Home / Self Care | Source: Ambulatory Visit | Attending: General Surgery | Admitting: General Surgery

## 2024-01-16 ENCOUNTER — Other Ambulatory Visit: Payer: Self-pay

## 2024-01-16 VITALS — BP 105/62 | HR 58 | Temp 98.0°F | Resp 18 | Ht 66.5 in | Wt 230.0 lb

## 2024-01-16 DIAGNOSIS — Z01818 Encounter for other preprocedural examination: Secondary | ICD-10-CM | POA: Diagnosis present

## 2024-01-16 HISTORY — DX: Sleep apnea, unspecified: G47.30

## 2024-01-16 HISTORY — DX: Essential (primary) hypertension: I10

## 2024-01-16 HISTORY — DX: Cardiac murmur, unspecified: R01.1

## 2024-01-16 LAB — CBC
HCT: 41.9 % (ref 36.0–46.0)
Hemoglobin: 13.3 g/dL (ref 12.0–15.0)
MCH: 27.5 pg (ref 26.0–34.0)
MCHC: 31.7 g/dL (ref 30.0–36.0)
MCV: 86.6 fL (ref 80.0–100.0)
Platelets: 209 10*3/uL (ref 150–400)
RBC: 4.84 MIL/uL (ref 3.87–5.11)
RDW: 13.8 % (ref 11.5–15.5)
WBC: 7.2 10*3/uL (ref 4.0–10.5)
nRBC: 0 % (ref 0.0–0.2)

## 2024-01-16 LAB — BASIC METABOLIC PANEL
Anion gap: 12 (ref 5–15)
BUN: 16 mg/dL (ref 8–23)
CO2: 26 mmol/L (ref 22–32)
Calcium: 11.9 mg/dL — ABNORMAL HIGH (ref 8.9–10.3)
Chloride: 104 mmol/L (ref 98–111)
Creatinine, Ser: 0.96 mg/dL (ref 0.44–1.00)
GFR, Estimated: >60 mL/min (ref >60)
Glucose, Bld: 108 mg/dL — ABNORMAL HIGH (ref 70–99)
Potassium: 4.2 mmol/L (ref 3.5–5.1)
Sodium: 142 mmol/L (ref 135–145)

## 2024-01-16 LAB — GLUCOSE, CAPILLARY: Glucose-Capillary: 99 mg/dL (ref 70–99)

## 2024-01-17 ENCOUNTER — Encounter (HOSPITAL_COMMUNITY): Payer: Self-pay

## 2024-01-20 NOTE — Progress Notes (Signed)
 Patient informef of surgery arrival time change. Patient to arrive at 1030 on 01/21/2024.

## 2024-01-21 ENCOUNTER — Ambulatory Visit (HOSPITAL_COMMUNITY)
Admission: RE | Admit: 2024-01-21 | Discharge: 2024-01-21 | Disposition: A | Discharge status: Home / Self Care | Source: Ambulatory Visit | Attending: General Surgery | Admitting: General Surgery

## 2024-01-21 ENCOUNTER — Encounter (HOSPITAL_COMMUNITY)
Admission: RE | Disposition: A | Discharge status: Home / Self Care | Payer: Self-pay | Source: Ambulatory Visit | Attending: General Surgery

## 2024-01-21 ENCOUNTER — Ambulatory Visit (HOSPITAL_COMMUNITY): Payer: Self-pay

## 2024-01-21 ENCOUNTER — Encounter (HOSPITAL_COMMUNITY): Payer: Self-pay | Admitting: General Surgery

## 2024-01-21 ENCOUNTER — Other Ambulatory Visit: Payer: Self-pay

## 2024-01-21 ENCOUNTER — Ambulatory Visit: Payer: PPO | Admitting: Psychiatry

## 2024-01-21 ENCOUNTER — Ambulatory Visit (HOSPITAL_COMMUNITY): Payer: Self-pay | Admitting: Physician Assistant

## 2024-01-21 DIAGNOSIS — R220 Localized swelling, mass and lump, head: Secondary | ICD-10-CM

## 2024-01-21 DIAGNOSIS — E039 Hypothyroidism, unspecified: Secondary | ICD-10-CM | POA: Insufficient documentation

## 2024-01-21 DIAGNOSIS — Z6837 Body mass index (BMI) 37.0-37.9, adult: Secondary | ICD-10-CM | POA: Insufficient documentation

## 2024-01-21 DIAGNOSIS — Z87891 Personal history of nicotine dependence: Secondary | ICD-10-CM | POA: Diagnosis not present

## 2024-01-21 DIAGNOSIS — G4733 Obstructive sleep apnea (adult) (pediatric): Secondary | ICD-10-CM

## 2024-01-21 DIAGNOSIS — Z7985 Long-term (current) use of injectable non-insulin antidiabetic drugs: Secondary | ICD-10-CM | POA: Insufficient documentation

## 2024-01-21 DIAGNOSIS — E66813 Obesity, class 3: Secondary | ICD-10-CM | POA: Diagnosis not present

## 2024-01-21 DIAGNOSIS — Z7984 Long term (current) use of oral hypoglycemic drugs: Secondary | ICD-10-CM | POA: Insufficient documentation

## 2024-01-21 DIAGNOSIS — E119 Type 2 diabetes mellitus without complications: Secondary | ICD-10-CM | POA: Diagnosis not present

## 2024-01-21 DIAGNOSIS — J189 Pneumonia, unspecified organism: Secondary | ICD-10-CM

## 2024-01-21 DIAGNOSIS — L723 Sebaceous cyst: Secondary | ICD-10-CM | POA: Diagnosis present

## 2024-01-21 DIAGNOSIS — I10 Essential (primary) hypertension: Secondary | ICD-10-CM | POA: Diagnosis not present

## 2024-01-21 DIAGNOSIS — I38 Endocarditis, valve unspecified: Secondary | ICD-10-CM | POA: Insufficient documentation

## 2024-01-21 HISTORY — PX: EXCISION MASS HEAD: SHX6702

## 2024-01-21 LAB — GLUCOSE, CAPILLARY
Glucose-Capillary: 165 mg/dL — ABNORMAL HIGH (ref 70–99)
Glucose-Capillary: 82 mg/dL (ref 70–99)

## 2024-01-21 SURGERY — EXCISION, MASS, HEAD
Anesthesia: Monitor Anesthesia Care | Site: Head

## 2024-01-21 MED ORDER — OXYCODONE HCL 5 MG/5ML PO SOLN: 5.0000 mg | Freq: Once | ORAL | Status: DC | PRN

## 2024-01-21 MED ORDER — ONDANSETRON HCL 4 MG/2ML IJ SOLN: 4.0000 mg | Freq: Once | INTRAMUSCULAR | Status: DC | PRN

## 2024-01-21 MED ORDER — FENTANYL CITRATE (PF) 250 MCG/5ML IJ SOLN
INTRAMUSCULAR | Status: AC
  Filled 2024-01-21: qty 5

## 2024-01-21 MED ORDER — LIDOCAINE-EPINEPHRINE (PF) 1 %-1:200000 IJ SOLN
INTRAMUSCULAR | Status: DC | PRN
Start: 2024-01-21 — End: 2024-01-21
  Administered 2024-01-21: 12 mL

## 2024-01-21 MED ORDER — INSULIN ASPART 100 UNIT/ML IJ SOLN: 0.0000 [IU] | INTRAMUSCULAR | Status: DC | PRN

## 2024-01-21 MED ORDER — LACTATED RINGERS IV SOLN: INTRAVENOUS | Status: DC

## 2024-01-21 MED ORDER — ENSURE PRE-SURGERY PO LIQD: 296.0000 mL | Freq: Once | ORAL | Status: DC

## 2024-01-21 MED ORDER — FENTANYL CITRATE (PF) 100 MCG/2ML IJ SOLN: 25.0000 ug | INTRAMUSCULAR | Status: DC | PRN

## 2024-01-21 MED ORDER — LIDOCAINE-EPINEPHRINE (PF) 1 %-1:200000 IJ SOLN
INTRAMUSCULAR | Status: AC
Start: 2024-01-21 — End: ?
  Filled 2024-01-21: qty 30

## 2024-01-21 MED ORDER — EPHEDRINE SULFATE-NACL 50-0.9 MG/10ML-% IV SOSY
PREFILLED_SYRINGE | INTRAVENOUS | Status: DC | PRN
  Administered 2024-01-21 (×2): 5 mg via INTRAVENOUS

## 2024-01-21 MED ORDER — PROPOFOL 500 MG/50ML IV EMUL
INTRAVENOUS | Status: DC | PRN
  Administered 2024-01-21: 100 ug/kg/min via INTRAVENOUS
  Administered 2024-01-21: 15 mg via INTRAVENOUS

## 2024-01-21 MED ORDER — PHENYLEPHRINE 80 MCG/ML (10ML) SYRINGE FOR IV PUSH (FOR BLOOD PRESSURE SUPPORT)
PREFILLED_SYRINGE | INTRAVENOUS | Status: DC | PRN
  Administered 2024-01-21: 80 ug via INTRAVENOUS

## 2024-01-21 MED ORDER — ONDANSETRON HCL 4 MG/2ML IJ SOLN
INTRAMUSCULAR | Status: AC
  Filled 2024-01-21: qty 2

## 2024-01-21 MED ORDER — CHLORHEXIDINE GLUCONATE 0.12 % MT SOLN
15.0000 mL | Freq: Once | OROMUCOSAL | Status: AC
  Administered 2024-01-21: 15 mL via OROMUCOSAL
  Filled 2024-01-21: qty 15

## 2024-01-21 MED ORDER — ORAL CARE MOUTH RINSE: 15.0000 mL | Freq: Once | OROMUCOSAL | Status: AC

## 2024-01-21 MED ORDER — MEPERIDINE HCL 25 MG/ML IJ SOLN: 6.2500 mg | INTRAMUSCULAR | Status: DC | PRN

## 2024-01-21 MED ORDER — FENTANYL CITRATE (PF) 250 MCG/5ML IJ SOLN
INTRAMUSCULAR | Status: DC | PRN
  Administered 2024-01-21: 25 ug via INTRAVENOUS

## 2024-01-21 MED ORDER — OXYCODONE HCL 5 MG PO TABS: 5.0000 mg | ORAL_TABLET | Freq: Once | ORAL | Status: DC | PRN

## 2024-01-21 MED ORDER — ACETAMINOPHEN 325 MG PO TABS: 325.0000 mg | ORAL_TABLET | ORAL | Status: DC | PRN

## 2024-01-21 MED ORDER — ONDANSETRON HCL 4 MG/2ML IJ SOLN
INTRAMUSCULAR | Status: DC | PRN
Start: 2024-01-21 — End: 2024-01-21
  Administered 2024-01-21: 4 mg via INTRAVENOUS

## 2024-01-21 MED ORDER — EPHEDRINE 5 MG/ML INJ
INTRAVENOUS | Status: AC
  Filled 2024-01-21: qty 5

## 2024-01-21 MED ORDER — CEFAZOLIN SODIUM-DEXTROSE 2-4 GM/100ML-% IV SOLN
2.0000 g | INTRAVENOUS | Status: AC
  Administered 2024-01-21: 2 g via INTRAVENOUS
  Filled 2024-01-21: qty 100

## 2024-01-21 MED ORDER — CHLORHEXIDINE GLUCONATE CLOTH 2 % EX PADS: 6.0000 | MEDICATED_PAD | Freq: Once | CUTANEOUS | Status: DC

## 2024-01-21 MED ORDER — PHENYLEPHRINE 80 MCG/ML (10ML) SYRINGE FOR IV PUSH (FOR BLOOD PRESSURE SUPPORT)
PREFILLED_SYRINGE | INTRAVENOUS | Status: AC
Start: 2024-01-21 — End: ?
  Filled 2024-01-21: qty 30

## 2024-01-21 MED ORDER — ACETAMINOPHEN 160 MG/5ML PO SOLN: 325.0000 mg | ORAL | Status: DC | PRN

## 2024-01-21 MED ORDER — ACETAMINOPHEN 500 MG PO TABS
1000.0000 mg | ORAL_TABLET | ORAL | Status: AC
  Administered 2024-01-21: 1000 mg via ORAL
  Filled 2024-01-21: qty 2

## 2024-01-21 MED ORDER — 0.9 % SODIUM CHLORIDE (POUR BTL) OPTIME
TOPICAL | Status: DC | PRN
  Administered 2024-01-21: 1000 mL

## 2024-01-21 MED ORDER — TRAMADOL HCL 50 MG PO TABS: 50.0000 mg | ORAL_TABLET | Freq: Four times a day (QID) | ORAL | 0 refills | Status: AC | PRN

## 2024-01-21 MED ORDER — PROPOFOL 1000 MG/100ML IV EMUL
INTRAVENOUS | Status: AC
  Filled 2024-01-21: qty 100

## 2024-01-21 MED ORDER — BUPIVACAINE HCL (PF) 0.25 % IJ SOLN
INTRAMUSCULAR | Status: AC
  Filled 2024-01-21: qty 30

## 2024-01-21 SURGICAL SUPPLY — 27 items
BAG COUNTER SPONGE SURGICOUNT (BAG) ×1 IMPLANT
CANISTER SUCT 3000ML PPV (MISCELLANEOUS) IMPLANT
CHLORAPREP W/TINT 26 (MISCELLANEOUS) ×1 IMPLANT
COVER SURGICAL LIGHT HANDLE (MISCELLANEOUS) ×1 IMPLANT
DERMABOND ADVANCED .7 DNX12 (GAUZE/BANDAGES/DRESSINGS) ×1 IMPLANT
DRAPE LAPAROTOMY 100X72 PEDS (DRAPES) ×1 IMPLANT
ELECT REM PT RETURN 9FT ADLT (ELECTROSURGICAL) ×1 IMPLANT
ELECTRODE REM PT RTRN 9FT ADLT (ELECTROSURGICAL) ×1 IMPLANT
GAUZE 4X4 16PLY ~~LOC~~+RFID DBL (SPONGE) IMPLANT
GLOVE BIO SURGEON STRL SZ7.5 (GLOVE) ×2 IMPLANT
GLOVE BIOGEL PI IND STRL 8 (GLOVE) ×1 IMPLANT
GOWN STRL REUS W/ TWL LRG LVL3 (GOWN DISPOSABLE) ×1 IMPLANT
GOWN STRL REUS W/ TWL XL LVL3 (GOWN DISPOSABLE) ×1 IMPLANT
KIT BASIN OR (CUSTOM PROCEDURE TRAY) ×1 IMPLANT
KIT TURNOVER KIT B (KITS) ×1 IMPLANT
NDL HYPO 25GX1X1/2 BEV (NEEDLE) ×1 IMPLANT
NEEDLE HYPO 25GX1X1/2 BEV (NEEDLE) ×1 IMPLANT
NS IRRIG 1000ML POUR BTL (IV SOLUTION) ×1 IMPLANT
PACK GENERAL/GYN (CUSTOM PROCEDURE TRAY) ×1 IMPLANT
PAD ARMBOARD POSITIONER FOAM (MISCELLANEOUS) ×1 IMPLANT
PENCIL SMOKE EVACUATOR (MISCELLANEOUS) ×1 IMPLANT
SUT MNCRL AB 4-0 PS2 18 (SUTURE) ×1 IMPLANT
SUT VIC AB 3-0 SH 27XBRD (SUTURE) ×1 IMPLANT
SYR CONTROL 10ML LL (SYRINGE) ×1 IMPLANT
TOWEL GREEN STERILE (TOWEL DISPOSABLE) ×1 IMPLANT
TOWEL GREEN STERILE FF (TOWEL DISPOSABLE) ×1 IMPLANT
TUBE CONNECTING 20X1/4 (TUBING) IMPLANT

## 2024-01-21 NOTE — H&P (Signed)
 Chief Complaint: New Consultation   History of Present Illness: Jazline Cumbee is a 78 y.o. female who is seen today as an office consultation at the request of Dr. Soyla Murphy for evaluation of New Consultation .   History of Present Illness The patient, with a history of parathyroidectomy, presents with a scalp lesion that has been present for at least six months. It has been increasingly bothersome, particularly when lying down. The patient sought care at an urgent care clinic, where she was prescribed a five-day course of antibiotics. Since starting the antibiotics, the lesion has decreased in size and has been draining a 'gooky' substance. The patient describes the lesion as feeling like 'apple jelly.' The patient denies pain, but finds the lesion to be 'annoying.'  Review of Systems: A complete review of systems was obtained from the patient. I have reviewed this information and discussed as appropriate with the patient. See HPI as well for other ROS.  Review of Systems  Constitutional: Negative for fever.  HENT: Negative for congestion.  Eyes: Negative for blurred vision.  Respiratory: Negative for cough, shortness of breath and wheezing.  Cardiovascular: Negative for chest pain and palpitations.  Gastrointestinal: Negative for heartburn.  Genitourinary: Negative for dysuria.  Musculoskeletal: Negative for myalgias.  Skin: Negative for rash.  Neurological: Negative for dizziness and headaches.  Psychiatric/Behavioral: Negative for depression and suicidal ideas.  All other systems reviewed and are negative.   Medical History: Past Medical History:  Diagnosis Date  Anxiety  Arthritis  Diabetes mellitus without complication (CMS/HHS-HCC)  GERD (gastroesophageal reflux disease)  History of cancer  Thyroid disease   There is no problem list on file for this patient.  Past Surgical History:  Procedure Laterality Date  HYSTERECTOMY  PARATHYROIDECTOMY  REPAIR ROTATOR CUFF TEAR  ACUTE OPEN    Allergies  Allergen Reactions  Atorvastatin Muscle Pain and Other (See Comments)  Muscle pain.  Ezetimibe Other (See Comments)  Caused pancreatitis   Current Outpatient Medications on File Prior to Visit  Medication Sig Dispense Refill  CARVEDILOL ORAL  FUROsemide (LASIX) 20 MG tablet TAKE 1 TO 2 TABLETS(20 TO 40 MG) BY MOUTH DAILY  levothyroxine (SYNTHROID) 125 MCG tablet  metFORMIN (GLUCOPHAGE-XR) 500 MG XR tablet Take 1,000 mg by mouth 2 (two) times daily  MOUNJARO 2.5 mg/0.5 mL pen injector   No current facility-administered medications on file prior to visit.   Family History  Problem Relation Age of Onset  High blood pressure (Hypertension) Mother  Hyperlipidemia (Elevated cholesterol) Father  Coronary Artery Disease (Blocked arteries around heart) Father  Diabetes Father    Social History   Tobacco Use  Smoking Status Former  Types: Cigarettes  Start date: 2019  Smokeless Tobacco Never    Social History   Socioeconomic History  Marital status: Widowed  Tobacco Use  Smoking status: Former  Types: Cigarettes  Start date: 2019  Smokeless tobacco: Never  Substance and Sexual Activity  Alcohol use: Never  Drug use: Never   Social Drivers of Corporate investment banker Strain: Low Risk (11/28/2023)  Received from [facility]  Overall Financial Resource Strain (CARDIA)  Difficulty of Paying Living Expenses: Not hard at all  Food Insecurity: No Food Insecurity (11/28/2023)  [facility]  Hunger Vital Sign  Worried About Running Out of Food in the Last Year: Never true  Ran Out of Food in the Last Year: Never true  Transportation Needs: No Transportation Needs (11/28/2023)  Received from [facility] - Transportation  Lack of Transportation (Medical): No  Lack of Transportation (Non-Medical): No  Physical Activity: Unknown (11/28/2023)  Received from [facility]  Exercise Vital Sign  Days of Exercise per Week: 0  days  Stress: Stress Concern Present (11/28/2023)  Received from [facility] - Occupational Stress Questionnaire  Feeling of Stress : Rather much  Social Connections: Socially Integrated (11/28/2023)  Received from [facility]  Social Network  How would you rate your social network (family, work, friends)?: Good participation with social networks  Housing Stability: Unknown (01/06/2024)  Housing Stability Vital Sign  Homeless in the Last Year: No   Objective:   BP (!) 151/87   Pulse 68   Temp 98.1 F (36.7 C) (Oral)   Resp 17   Ht 5' 6.5" (1.689 m)   Wt 108 kg   SpO2 98%   BMI 37.84 kg/m    Body mass index is 37.56 kg/m.  Physical Exam  PE:  Constitutional: No acute distress, conversant, appears states age. Eyes: Anicteric sclerae, moist conjunctiva, no lid lag Lungs: Clear to auscultation bilaterally, normal respiratory effort CV: regular rate and rhythm, no murmurs, no peripheral edema, pedal pulses 2+ GI: Soft, no masses or hepatosplenomegaly, non-tender to palpation Skin: No rashes, palpation reveals normal turgor Psychiatric: appropriate judgment and insight, oriented to person, place, and time Skin: 3cm firm cyst to posterior midline scalp  Assessment and Plan:  Diagnoses and all orders for this visit:  Inclusion cyst   Bita Cartwright is a 78 y.o. female   Assessment & Plan Scalp Cyst  A large, symptomatic cyst on the scalp may be infected. Antibiotics have been administered, resulting in some decrease in size and possible drainage. Plan for surgical excision in the operating room under local anesthesia with sedation. If the cyst becomes more painful or swollen before surgery, call the office for a possible additional course of antibiotics. Schedulers will contact to arrange the surgery date. 4. Benefits of the procedure to include:. Infection, bleeding, recurrence. Patient reports understanding and wishes to  proceed  No follow-ups on file.  Axel Filler, MD, [name], [place] [facility]

## 2024-01-21 NOTE — Anesthesia Postprocedure Evaluation (Signed)
 Anesthesia Post Note  Patient: Linda Atkinson  Procedure(s) Performed: EXCISION, MASS, HEAD (Head)     Patient location during evaluation: PACU Anesthesia Type: MAC Level of consciousness: awake and alert Pain management: pain level controlled Vital Signs Assessment: post-procedure vital signs reviewed and stable Respiratory status: spontaneous breathing, nonlabored ventilation, respiratory function stable and patient connected to nasal cannula oxygen Cardiovascular status: stable and blood pressure returned to baseline Postop Assessment: no apparent nausea or vomiting Anesthetic complications: no  No notable events documented.  Last Vitals:  Vitals:   01/21/24 1245 01/21/24 1300  BP: (!) 130/57 (!) 150/64  Pulse: (!) 58 (!) 55  Resp: 13 17  Temp:  36.6 C  SpO2: 95% 95%    Last Pain:  Vitals:   01/21/24 1230  TempSrc:   PainSc: 0-No pain   Pain Goal: Patients Stated Pain Goal: 3 (01/21/24 1053)                 Kristel Durkee L [place]

## 2024-01-21 NOTE — Anesthesia Preprocedure Evaluation (Addendum)
 Anesthesia Evaluation  Patient identified by MRN, date of birth, ID band Patient awake    Reviewed: Allergy & Precautions, H&P , NPO status , Patient's Chart, lab work & pertinent test results  History of Anesthesia Complications (+) history of anesthetic complications  Airway Mallampati: III  TM Distance: >3 FB Neck ROM: Full    Dental no notable dental hx. (+) Poor Dentition, Missing, Dental Advisory Given, Partial Upper   Pulmonary shortness of breath, sleep apnea , pneumonia, former smoker   Pulmonary exam normal breath sounds clear to auscultation       Cardiovascular hypertension, Pt. on medications Normal cardiovascular exam+ Valvular Problems/Murmurs  Rhythm:Regular Rate:Normal     Neuro/Psych  PSYCHIATRIC DISORDERS Anxiety Depression    negative neurological ROS     GI/Hepatic Neg liver ROS,GERD  ,,  Endo/Other  diabetes, Type 2Hypothyroidism  Class 3 obesity  Renal/GU Renal disease  negative genitourinary   Musculoskeletal  (+) Arthritis ,    Abdominal   Peds negative pediatric ROS (+)  Hematology negative hematology ROS (+)   Anesthesia Other Findings Upper bridge  Reproductive/Obstetrics negative OB ROS                             Anesthesia Physical Anesthesia Plan  ASA: 3  Anesthesia Plan: MAC   Post-op Pain Management: Minimal or no pain anticipated   Induction: Intravenous  PONV Risk Score and Plan: Propofol infusion  Airway Management Planned: Natural Airway and Simple Face Mask  Additional Equipment: None  Intra-op Plan:   Post-operative Plan: Extubation in OR  Informed Consent: I have reviewed the patients History and Physical, chart, labs and discussed the procedure including the risks, benefits and alternatives for the proposed anesthesia with the patient or authorized representative who has indicated his/her understanding and acceptance.     Dental  advisory given  Plan Discussed with: CRNA and Anesthesiologist  Anesthesia Plan Comments: ( )        Anesthesia Quick Evaluation

## 2024-01-21 NOTE — Op Note (Signed)
 01/21/2024  12:32 PM  PATIENT:  Linda Atkinson  78 y.o. female  PRE-OPERATIVE DIAGNOSIS:  POSTERIOR HEAD CYST  POST-OPERATIVE DIAGNOSIS:  POSTERIOR HEAD CYST  PROCEDURE:  Procedure(s): EXCISION, MASS 2x1cm, HEAD (N/A)  SURGEON:  Surgeons and Role:    Axel Filler, MD - Primary  ASSISTANTS: none   ANESTHESIA:   local and IV sedation  EBL:  minimal   BLOOD ADMINISTERED:none  DRAINS: none   LOCAL MEDICATIONS USED:  BUPIVICAINE   SPECIMEN:  Source of Specimen:  cyst  DISPOSITION OF SPECIMEN:  PATHOLOGY  COUNTS:  YES  TOURNIQUET:  * No tourniquets in log *  DICTATION: .Dragon Dictation Findings: Patient with a 2.1 cm sebaceous cyst.  This did appear to have some fibrinous exudate.  This was excised in its entirety.  Details of procedure: After the patient was consented she was taken back to the OR and placed in the lateral position.  She underwent IV sedation.  Patient was then prepped and draped in standard fashion.  A timeout was called all facts verified.  At this time Marcaine with epi was used to create a field block around the cyst.  At this time an elliptical incision was made around the area.  The sebaceous cyst contents could easily be seen.  The entirety of the sebaceous cyst was excised.  At this time this area was checked for hemostasis which was obtained using cautery.  At this time the deep dermal layer was reapproximated using interrupted 3-0 Vicryl's.  The skin was reapproximate using 4-0 Monocryl in interrupted fashion.  The skin was dressed Dermabond.  Patient tolerated the procedure well was taken to the recovery in stable condition.   PLAN OF CARE: Discharge to home after PACU  PATIENT DISPOSITION:  PACU - hemodynamically stable.   Delay start of Pharmacological VTE agent (>24hrs) due to surgical blood loss or risk of bleeding: not applicable

## 2024-01-21 NOTE — Transfer of Care (Signed)
 Immediate Anesthesia Transfer of Care Note  Patient: Linda Atkinson  Procedure(s) Performed: EXCISION, MASS, HEAD (Head)  Patient Location: PACU  Anesthesia Type:MAC  Level of Consciousness: awake, alert , and patient cooperative  Airway & Oxygen Therapy: Patient Spontanous Breathing and Patient connected to face mask oxygen  Post-op Assessment: Report given to RN and Post -op Vital signs reviewed and stable  Post vital signs: Reviewed and stable  Last Vitals:  Vitals Value Taken Time  BP 115/57 01/21/24 1230  Temp 36.6 C 01/21/24 1230  Pulse 64 01/21/24 1235  Resp 18 01/21/24 1235  SpO2 93 % 01/21/24 1235  Vitals shown include unfiled device data.  Last Pain:  Vitals:   01/21/24 1230  TempSrc:   PainSc: 0-No pain      Patients Stated Pain Goal: 3 (01/21/24 1053)  Complications: No notable events documented.

## 2024-01-22 ENCOUNTER — Encounter (HOSPITAL_COMMUNITY): Payer: Self-pay | Admitting: General Surgery

## 2024-02-04 ENCOUNTER — Ambulatory Visit: Payer: PPO | Admitting: Psychiatry

## 2024-02-04 DIAGNOSIS — Z91198 Patient's noncompliance with other medical treatment and regimen for other reason: Secondary | ICD-10-CM

## 2024-02-04 NOTE — Progress Notes (Signed)
 No-show/Short-notice cancellation note Linda Czar, PhD, [facility]  Patient ID: Linda Atkinson     MRN: [id number]     Date: 02/04/2024     Appt time: 11am  Arrived for appt on time but began to become ill while waiting for start time, canceled.  Waive fee, RS as able.  Robley Fries, PhD Linda Czar, PhD LP Clinical Psychologist, [facility] [address], [address] [place], [place] [address] [phone]

## 2024-02-18 ENCOUNTER — Ambulatory Visit: Payer: PPO | Admitting: Psychiatry

## 2024-03-04 ENCOUNTER — Ambulatory Visit (INDEPENDENT_AMBULATORY_CARE_PROVIDER_SITE_OTHER): Payer: PPO | Admitting: Psychiatry

## 2024-03-04 DIAGNOSIS — F4321 Adjustment disorder with depressed mood: Secondary | ICD-10-CM | POA: Diagnosis not present

## 2024-03-04 DIAGNOSIS — Z62820 Parent-biological child conflict: Secondary | ICD-10-CM

## 2024-03-04 NOTE — Progress Notes (Signed)
 Psychotherapy Progress Note [facility] [name], PhD LP  Patient ID: Linda Atkinson)    MRN: [id number] Therapy format: Individual psychotherapy Date: 03/04/2024      Start: 1:02p     Stop: 1:50p     Time Spent: 48 min Location: In-person   Session narrative (presenting needs, interim history, self-report of stressors and symptoms, applications of prior therapy, status changes, and interventions made in session) Last seen February, brief encounter early April but too ill to remain.  Chart notes 3/18 surgery to remove a cyst, with only discomforts associated with surgical glue in her hair, addressed last week.  Also last week PCP visit noting diabetes controlled, thyroid  condition relatively uncontrolled while on Mounjaro, which is well-tolerated and seems effective, but with improving TSH.  For herself, pt says 19yo grandson [name] traveled recently with [name] and asked him why the silence between him and grandma, and why he sent her a tell-off letter in October.  Word is that [name] claimed he only sent a forgiveness letter and denied not speaking to her.  Mixed feelings about the apparent whitewash, but told her grandson effectively he doesn't have to stick up for her, no obligation to get involved.  Reviewed the letter [name] sent last fall, assessing what facts he is supposedly reacting to.  She credibly asserts no truth whatsoever in the idea that he was exposed to violence and pornography during her relationship with Wyn, only that they had a tumultuous relationship, with no violence.  Wyn was alcoholic, a quiet drunk by description, but no indication in her memory that he would have exposed [name] to any of that, seems to be a fabrication to justify his judgment.  The list of sins mentioned by [name] reads as a recital of a biblical list, while other passages read as the sophomoric use of popular press neuroscience education, signs which, to her, suggest  manipulation by his Librarian, academic.  Allowed that this could well be, just no way to be sure how much is his idea and how much planted.  Hx shared how [name] has a long pattern of servicing an image of himself not supported by the facts, both in achievements and in a self-serving sense of victimhood.  He took 6 years on his [place] Haematologist, never graduated), he doesn't know she knows about that, and how he had an affair later.  Time back younger when [name] wrote his uncle [name] a letter proclaiming how badly he had hurt him with some otherwise negligible comment, a preview of obsessive, overworked thinking to come.  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  Hx of admitting once to relative that he had a horrible sex addiction.  Hx of having grandiose business ideas, and other drastic ideas like taking his son out of school to home school while working full-time, that collapsed quickly.  On the whole, enough reasons to suspect that [name] may be undiagnosed bipolar and susceptible to both his own emotional reasoning and to [employer].  Therapeutic modalities: Cognitive Behavioral Therapy, Solution-Oriented/Positive Psychology, and Assertiveness/Communication  Mental Status/Observations:  Appearance:   Casual     Behavior:  Appropriate  Motor:  Normal  Speech/Language:   Clear and Coherent  Affect:  Appropriate  Mood:  Appropriate to topic  Thought process:  normal  Thought content:    WNL  Sensory/Perceptual disturbances:    WNL  Orientation:  Fully oriented  Attention:  Good    Concentration:  Good  Memory:  WNL  Insight:    Good  Judgment:   Good  Impulse Control:  Good   Risk Assessment: Danger to Self: No Self-injurious Behavior: No Danger to Others: No Physical Aggression / Violence: No Duty to Warn: No Access to Firearms a concern: No  Assessment of progress:  progressing  Diagnosis:    ICD-10-CM   1. Adjustment disorder with depressed mood  F43.21     2. Relationship problem between parent and child  Z62.820      Plan:  Morale -- Continue with desired activities, relationships, self-care Reckoning with [name] -- Consider alternatives as multiple right ways to deal with him, primarily a question of how much her dignity needs (if anything) and what approach would be both caring enough and realistic enough to try out with him.  Ideas at least include a relatively short letter inquiring (been thinking about your message, need to understand better if you mean it the way it sounds, willing to talk?), a short statement that she's honestly offended (hurt, won't belabor it, sounds like he wants to break off, but willing to hear him out if otherwise), or let silence fall and let the ball be in his court.  As for what's happened, recommend consider how her own surprise message revising rehab costs covered as a loan to be paid back would tilt him emotionally (though in no way grounds for his holier-than-thou response).  Maintain trust that she does not have the power to tip him into suicidality, just a certain responsibility to stay fair and temperate in how she approaches it. Boundaries with grandkids -- Maintain trust that the boys have seen enough from their father and their grandmother to not be susceptible to lies about her.  OK to let one or both them advocate if they truly desire. Other recommendations/advice -- As may be noted above.  Continue to utilize previously learned skills ad lib. Medication compliance -- Maintain medication as prescribed and work faithfully with relevant prescriber(s) if any changes are desired or seem indicated. Crisis service -- Aware of call list and work-in appts.  Call the clinic on-call service, 988/hotline, 911, or present to [facility] or ER if any life-threatening psychiatric crisis. Followup -- Return for time as available, recommend sched ahead.  Next  scheduled visit with me Visit date not found.  Next scheduled in this office Visit date not found.  Lamar Kendall, PhD [name], PhD LP Clinical Psychologist, [facility] [address] [place], [place] [address] [phone]

## 2024-03-20 ENCOUNTER — Ambulatory Visit (INDEPENDENT_AMBULATORY_CARE_PROVIDER_SITE_OTHER): Admitting: Psychiatry

## 2024-03-20 DIAGNOSIS — Z62820 Parent-biological child conflict: Secondary | ICD-10-CM | POA: Diagnosis not present

## 2024-03-20 DIAGNOSIS — F4321 Adjustment disorder with depressed mood: Secondary | ICD-10-CM

## 2024-03-20 NOTE — Progress Notes (Signed)
 Psychotherapy Progress Note [facility] [name], PhD LP  Patient ID: AFUA HOOTS)    MRN: [id number] Therapy format: Individual psychotherapy Date: 03/20/2024      Start: 10:21a     Stop: 11:10a     Time Spent: 49 min Location: In-person   Session narrative (presenting needs, interim history, self-report of stressors and symptoms, applications of prior therapy, status changes, and interventions made in session) Mother's Day came and went.  Knows from grandson that son has moved out of the house, and pretty sure he and Glade have gone different directions.  Coming to the conclusion she wants her son to know that all the accusations he wrote up in that fateful letter were untrue, ad well as to ask why he felt the need to write it up in a letter like that.    Side issue of sister Niels, 1 yrs younger, who got spoiled compared to Denijah and older brother Alm (by 4 yrs).  Sister has eschewed brother, beyond any reason.  Hx of her daughter [name] confiding in Nambe and Bennet, [name] borrowing money from B Alm and leaving payments incomplete while reciting the attitude that he's no good.  One incident where Bayley blessed both of them out and they didn't speak 5 yrs, until Meosha's pancreatitis news and reopened relationship.    Aware that opening up anything with [name] right now would drive her to lose the ability she's worked on over years to hold her tongue and deal with offenses better, rather than become more automatically indignant.  Also turns out the issue of loaning vs. giving [name] the money for his recovery and hospitalization, has roots in a long history of loaning him smaller amounts never paid back, and to some extent in her calling off obligations when it appeared to be just too hard for him.  Affirmed and encouraged.  Therapeutic modalities: Cognitive Behavioral Therapy, Solution-Oriented/Positive Psychology, and Ego-Supportive  Mental  Status/Observations:  Appearance:   Casual     Behavior:  Appropriate  Motor:  Normal  Speech/Language:   Clear and Coherent  Affect:  Appropriate  Mood:  Appropriate to topic  Thought process:  normal  Thought content:    WNL  Sensory/Perceptual disturbances:    WNL  Orientation:  Fully oriented  Attention:  Good    Concentration:  Good  Memory:  WNL  Insight:    Good  Judgment:   Good  Impulse Control:  Good   Risk Assessment: Danger to Self: No Self-injurious Behavior: No Danger to Others: No Physical Aggression / Violence: No Duty to Warn: No Access to Firearms a concern: No  Assessment of progress:  progressing  Diagnosis:   ICD-10-CM   1. Adjustment disorder with depressed mood  F43.21     2. Relationship problem between parent and child  Z62.820      Plan:  Morale -- Continue with desired activities, relationships, self-care Reckoning with [name] -- Consider alternatives as multiple right ways to deal with him, primarily a question of how much her dignity needs (if anything) and what approach would be both caring enough and realistic enough to try out with him.  Ideas at least include a relatively short letter inquiring (been thinking about your message, need to understand better if you mean it the way it sounds, willing to talk?), a short statement that she's honestly offended (hurt, won't belabor it, sounds like he wants to break off, but willing to hear him out if  otherwise), or let silence fall and let the ball be in his court.  As for what's happened, recommend consider how her own surprise message revising rehab costs covered as a loan to be paid back would tilt him emotionally (though in no way grounds for his holier-than-thou response).  Maintain trust that she does not have the power to tip him into suicidality, just a certain responsibility to stay fair and temperate in how she approaches it. Boundaries with grandkids -- Maintain trust that the boys have seen  enough from their father and their grandmother to not be susceptible to lies about her.  OK to let one or both them advocate if they truly desire. Other recommendations/advice -- As may be noted above.  Continue to utilize previously learned skills ad lib. Medication compliance -- Maintain medication as prescribed and work faithfully with relevant prescriber(s) if any changes are desired or seem indicated. Crisis service -- Aware of call list and work-in appts.  Call the clinic on-call service, 988/hotline, 911, or present to [facility] or ER if any life-threatening psychiatric crisis. Followup -- Return for time as already scheduled, avail earlier @ PT's need.  Next scheduled visit with me 04/24/2024.  Next scheduled in this office 04/24/2024.  Lamar Kendall, PhD [name], PhD LP Clinical Psychologist, [facility] [address] [place], [place] [address] [phone]

## 2024-04-08 ENCOUNTER — Ambulatory Visit (INDEPENDENT_AMBULATORY_CARE_PROVIDER_SITE_OTHER): Admitting: Psychiatry

## 2024-04-08 DIAGNOSIS — F4321 Adjustment disorder with depressed mood: Secondary | ICD-10-CM | POA: Diagnosis not present

## 2024-04-08 DIAGNOSIS — Z62820 Parent-biological child conflict: Secondary | ICD-10-CM

## 2024-04-08 NOTE — Progress Notes (Signed)
 Psychotherapy Progress Note [facility] [name], PhD LP  Patient ID: Linda Atkinson)    MRN: [id number] Therapy format: Individual psychotherapy Date: 04/08/2024      Start: 1:04p     Stop: 1:52p     Time Spent: 48 min Location: In-person   Session narrative (presenting needs, interim history, self-report of stressors and symptoms, applications of prior therapy, status changes, and interventions made in session) Been thinking further about settling her grudge against her son.  Knows she has wanted to rub his face in it sometimes, get his facts straight for him, pull rank as a mother, and make him feel guilty, but honestly, tired of holding it.  Resolved she can let it go, which we refined in session to mean letting go not of the truth that his manifesto was offensive but of any demands for him to learn, or apologize, or even be in touch right now, just let him be wrong if he still believes the dramatic things he said, or let him go prodigal and headstrong, if that's how things just are, and let life teach him instead of assigning herself the responsibility.  Affirmed the choice and discussed further dynamics with her grandsons, to the effect of letting the one who took up her cause querying his father why the estrangement and letting him off any hook for having to defend her or prosecute her case, just OK for him to look into it with his own father if he truly wants to, but clearly not beholden to make her dignity happen -- she already has it, whether it's defended or not.  Discussed any lessons taken from how she approached [name] a couple years ago, realized that it would have been not necessarily unfair but still a surprise and something of a shock to be asked to pay back thousands that would have looked like the next gift, given a track record of borrowing in words only.  History reiterated about his W Glade seemingly leading him by the nose, leaving for unclear reasons  and swooping back in after he acquired a house.  Clarified that none of it with [name] has to be about trusting her or not trusting her.  Affirmed and encouraged in discerning which way to go and keeping it constructive either way.  Therapeutic modalities: Cognitive Behavioral Therapy and Solution-Oriented/Positive Psychology  Mental Status/Observations:  Appearance:   Casual     Behavior:  Appropriate  Motor:  Normal  Speech/Language:   Clear and Coherent  Affect:  Appropriate  Mood:  Appropriate to topic  Thought process:  normal  Thought content:    WNL  Sensory/Perceptual disturbances:    WNL  Orientation:  Fully oriented  Attention:  Good    Concentration:  Good  Memory:  WNL  Insight:    Good  Judgment:   Good  Impulse Control:  Good   Risk Assessment: Danger to Self: No Self-injurious Behavior: No Danger to Others: No Physical Aggression / Violence: No Duty to Warn: No Access to Firearms a concern: No  Assessment of progress:  progressing  Diagnosis:   ICD-10-CM   1. Adjustment disorder with depressed mood  F43.21     2. Relationship problem between parent and child  Z62.820      Plan:  Morale -- Continue with desired activities, relationships, self-care Reckoning with [name] -- Consider alternatives as multiple right ways to deal with him, primarily a question of how much her dignity needs (if anything)  and what approach would be both caring enough and realistic enough to try out with him.  Ideas at least include a relatively short letter inquiring (been thinking about your message, need to understand better if you mean it the way it sounds, willing to talk?), a short statement that she's honestly offended (hurt, won't belabor it, sounds like he wants to break off, but willing to hear him out if otherwise), or let silence fall and let the ball be in his court.  As for what's happened, recommend consider how her own surprise message revising rehab costs covered as a loan  to be paid back would tilt him emotionally (though in no way grounds for his holier-than-thou response).  Maintain trust that she does not have the power to tip him into suicidality, just a certain responsibility to stay fair and temperate in how she approaches it. Boundaries with grandkids -- Maintain trust that the boys have seen enough from their father and their grandmother to not be susceptible to lies about her.  OK to let one or both them advocate if they truly desire. Other recommendations/advice -- As may be noted above.  Continue to utilize previously learned skills ad lib. Medication compliance -- Maintain medication as prescribed and work faithfully with relevant prescriber(s) if any changes are desired or seem indicated. Crisis service -- Aware of call list and work-in appts.  Call the clinic on-call service, 988/hotline, 911, or present to [facility] or ER if any life-threatening psychiatric crisis. Followup -- Return for time as already scheduled, recommend sched ahead.  Next scheduled visit with me 04/24/2024.  Next scheduled in this office 04/24/2024.  Lamar Kendall, PhD [name], PhD LP Clinical Psychologist, [facility] [address], [address] [place], [place] [address] [phone]

## 2024-04-24 ENCOUNTER — Ambulatory Visit: Admitting: Psychiatry

## 2024-04-24 DIAGNOSIS — F4321 Adjustment disorder with depressed mood: Secondary | ICD-10-CM

## 2024-04-24 DIAGNOSIS — Z62820 Parent-biological child conflict: Secondary | ICD-10-CM

## 2024-04-24 NOTE — Progress Notes (Unsigned)
 Psychotherapy Progress Note [facility] Delora Ferry, PhD LP  Patient ID: Linda Atkinson)    MRN: [id number] Therapy format: Individual psychotherapy Date: 04/24/2024      Start: 3:04p     Stop: ***:***     Time Spent: *** min Location: In-person   Session narrative (presenting needs, interim history, self-report of stressors and symptoms, applications of prior therapy, status changes, and interventions made in session) Primary concern today with Sister Linda Atkinson (63) and niece Linda Atkinson.  Linda Atkinson is very supportive, but there was a 5-year estrangement 2017-22, and [name] feels she does let herself get taken advantage, often enough about loaning her money and paying what was effectively a tab each year.  Fibromyalgia sufferer, fired for absenteeism, filed for disability, and in a prolonged appeal process.  Provided a lot of financial assistance in 2023, helped her work through [employer] retirement, and Linda Atkinson in her unfounded optimism, started a good-faith effort to repay some of the $10K or so owed.  Breaking story now is how Linda Atkinson this week skipped out early on an apartment lease and begged $1300 on very short notice to pay it off (done), then presumptuously upgraded movers, and took an extra 2 days off work, and accepted an Engineer, technical sales a good deal on a couch without working out how to transport it, creating surprise burdens for Linda Atkinson to get tit moved for her, at her own expense.  And now today Linda Atkinson is  professing helplessness about the derelict condition of her yard (which is actually landlord's responsibility).  Feels for Linda Atkinson, who has objectively had it hard growing up and watched a mother and father both complain and mother show very shortsighted, irrational judgment about money, and is startign to take shortcuts herself, like eat out often.    Therapeutic modalities: {AM:23362::Cognitive Behavioral Therapy,Solution-Oriented/Positive  Psychology}  Mental Status/Observations:  Appearance:   {PSY:22683}     Behavior:  {PSY:21022743}  Motor:  {PSY:22302}  Speech/Language:   {PSY:22685}  Affect:  {PSY:22687}  Mood:  {PSY:31886}  Thought process:  {PSY:31888}  Thought content:    {PSY:970-802-8292}  Sensory/Perceptual disturbances:    {PSY:250-340-0229}  Orientation:  {Psych Orientation:23301::Fully oriented}  Attention:  {Good-Fair-Poor ratings:23770::Good}    Concentration:  {Good-Fair-Poor ratings:23770::Good}  Memory:  {PSY:289-588-3068}  Insight:    {Good-Fair-Poor ratings:23770::Good}  Judgment:   {Good-Fair-Poor ratings:23770::Good}  Impulse Control:  {Good-Fair-Poor ratings:23770::Good}   Risk Assessment: Danger to Self: {Risk:22599::No} Self-injurious Behavior: {Risk:22599::No} Danger to Others: {Risk:22599::No} Physical Aggression / Violence: {Risk:22599::No} Duty to Warn: {AMYesNo:22526::No} Access to Firearms a concern: {AMYesNo:22526::No}  Assessment of progress:  {Progress:22147::progressing}  Diagnosis: No diagnosis found. Plan:  *** Other recommendations/advice -- As may be noted above.  Continue to utilize previously learned skills ad lib. Medication compliance -- Maintain medication as prescribed and work faithfully with relevant prescriber(s) if any changes are desired or seem indicated. Crisis service -- Aware of call list and work-in appts.  Call the clinic on-call service, 988/hotline, 911, or present to [facility] or ER if any life-threatening psychiatric crisis. Followup -- No follow-ups on file.  Next scheduled visit with me 05/07/2024.  Next scheduled in this office 05/07/2024.  Maretta Shaper, PhD Delora Ferry, PhD LP Clinical Psychologist, [facility] [address], [address] [place], [place] [address] [phone]

## 2024-05-07 ENCOUNTER — Ambulatory Visit (INDEPENDENT_AMBULATORY_CARE_PROVIDER_SITE_OTHER): Admitting: Psychiatry

## 2024-05-07 DIAGNOSIS — Z62820 Parent-biological child conflict: Secondary | ICD-10-CM | POA: Diagnosis not present

## 2024-05-07 DIAGNOSIS — F4321 Adjustment disorder with depressed mood: Secondary | ICD-10-CM

## 2024-05-07 NOTE — Progress Notes (Signed)
 Psychotherapy Progress Note [facility] [name], PhD LP  Patient ID: Linda Atkinson)    MRN: [id number] Therapy format: Individual psychotherapy Date: 05/07/2024      Start: 1:13p     Stop: 2:01p     Time Spent: 48 min Location: In-person   Session narrative (presenting needs, interim history, self-report of stressors and symptoms, applications of prior therapy, status changes, and interventions made in session) Been thinking about her will, including a question whether to leave her condo to sister.  Alternative to grant lifetime right to live there so she has secure housing, but property would belong to her grandsons jointly.  Discussed pros/cons, complications, and endorsed her plan.  Meanwhile, has a lead for one of her grandsons on a condo in her development, hopeful it could work out.  Linda Atkinson remains out of touch.  No further inclination to start a new conversation with him, but anger softening.  More leery of starting a new round of things she can't believe, or that just sound like more self-serving rationalizations and defensiveness on his part.  Endorsed her choice in the matter.  Physically, recent BP bottoming out.  Saw physician and is tapering Lasix , which was an outdated scrip in the first place.  Supported in clarifying treatments and updating strategy with her physician.  Remains on vit D, endorsed for optimal brain function.  Discussed supplements including B vitamins and omega 3 for general benefit.    Therapeutic modalities: Cognitive Behavioral Therapy, Solution-Oriented/Positive Psychology, and Ego-Supportive  Mental Status/Observations:  Appearance:   Casual     Behavior:  Appropriate  Motor:  Normal  Speech/Language:   Clear and Coherent  Affect:  Appropriate  Mood:  dysthymic and mildly  Thought process:  normal  Thought content:    WNL  Sensory/Perceptual disturbances:    WNL  Orientation:  Fully oriented  Attention:  Good     Concentration:  Good  Memory:  WNL  Insight:    Good  Judgment:   Good  Impulse Control:  Good   Risk Assessment: Danger to Self: No Self-injurious Behavior: No Danger to Others: No Physical Aggression / Violence: No Duty to Warn: No Access to Firearms a concern: No  Assessment of progress:  progressing  Diagnosis:   ICD-10-CM   1. Adjustment disorder with depressed mood  F43.21     2. Relationship problem between parent and child  Z62.820      Plan:  Morale -- Continue with desired activities, relationships, self-care Reckoning with Linda Atkinson -- Consider alternatives as multiple right ways to deal with him, primarily a question of how much her dignity needs (if anything) and what approach would be both caring enough and realistic enough to try out with him.  Ideas at least include a relatively short letter inquiring (been thinking about your message, need to understand better if you mean it the way it sounds, willing to talk?), a short statement that she's honestly offended (hurt, won't belabor it, sounds like he wants to break off, but willing to hear him out if otherwise), or let silence fall and let the ball be in his court.  As for what's happened, recommend consider how her own surprise message revising rehab costs covered as a loan to be paid back would tilt him emotionally (though in no way grounds for his holier-than-thou response).  Maintain trust that she does not have the power to tip him into suicidality, just a certain responsibility to stay fair and  temperate in how she approaches it, if she interacts. Boundaries with grandkids -- Maintain trust that the boys have seen enough from their father and their grandmother to not be susceptible to lies about her.  Pursue a normal relationship with them as able.  OK to let one or both them advocate to their father if they truly desire, but stay above reproach for perceived manipulation. Self-care -- Review health conditions for awareness  and ability to follow recommendations Other recommendations/advice -- As may be noted above.  Continue to utilize previously learned skills ad lib. Medication compliance -- Maintain medication as prescribed and work faithfully with relevant prescriber(s) if any changes are desired or seem indicated. Crisis service -- Aware of call list and work-in appts.  Call the clinic on-call service, 988/hotline, 911, or present to [facility] or ER if any life-threatening psychiatric crisis. Followup -- Return for time as already scheduled.  Next scheduled visit with me 05/25/2024.  Next scheduled in this office 05/25/2024.  Linda Kendall, PhD [name], PhD LP Clinical Psychologist, [facility] [address] [place], [place] [address] [phone]

## 2024-05-25 ENCOUNTER — Ambulatory Visit: Admitting: Psychiatry

## 2024-05-25 DIAGNOSIS — Z62820 Parent-biological child conflict: Secondary | ICD-10-CM

## 2024-05-25 DIAGNOSIS — F4321 Adjustment disorder with depressed mood: Secondary | ICD-10-CM | POA: Diagnosis not present

## 2024-05-25 NOTE — Progress Notes (Signed)
 Psychotherapy Progress Note [facility] [name], PhD LP  Patient ID: Linda Atkinson)    MRN: [id number] Therapy format: Individual psychotherapy Date: 05/25/2024      Start: 4:20p     Stop: 5:05p     Time Spent: 45 min Location: In-person   Session narrative (presenting needs, interim history, self-report of stressors and symptoms, applications of prior therapy, status changes, and interventions made in session) Decided not to put her grandsons in the position of managing a house that her sister would live in, probably irresponsibly.  Re. Niels, she is underwriting yard maintenance and getting a few lunches but worrying if she's enabling.  (No, it's just charity, plus frustration.)  Niece [name] has made a responsible offer to repay the loan, came up with  Re [name], has gotten advice from a friend to record herself and send it to him, but won't do that -- it would just be pursuing anger further when she wants to detach and feels it's best.  Could still try the idea of reopening contact and asking him if he really believes the letter he wrote.  Approaching 1 year since she last saw him, Aug, before the Oct manifesto.    Reviewed principles for best odds communication if she does engage, including getting an agreement to talk, even briefly about the subject, then asking if he still believes what he wrote, and let his answer guide her whether it's worth pursuing, but nothing to prove on her part, only assess, from the point of view that she needs his help understanding what to make of it, now that her reactivity is worn off some.  Agreed, it all depends on whether she can come to it settled enough to deal with whatever way he responds, or doesn't.  Therapeutic modalities: Cognitive Behavioral Therapy, Solution-Oriented/Positive Psychology, and Ego-Supportive  Mental Status/Observations:  Appearance:   Casual     Behavior:  Appropriate  Motor:  Normal   Speech/Language:   Clear and Coherent  Affect:  Appropriate  Mood:  normal and wounded when allowing it  Thought process:  normal  Thought content:    WNL  Sensory/Perceptual disturbances:    WNL  Orientation:  Fully oriented  Attention:  Good    Concentration:  Good  Memory:  WNL  Insight:    Good  Judgment:   Good  Impulse Control:  Good   Risk Assessment: Danger to Self: No Self-injurious Behavior: No Danger to Others: No Physical Aggression / Violence: No Duty to Warn: No Access to Firearms a concern: No  Assessment of progress:  progressing  Diagnosis:   ICD-10-CM   1. Adjustment disorder with depressed mood  F43.21     2. Relationship problem between parent and child  Z62.820      Plan:  Morale -- Continue with desired activities, relationships, self-care Reckoning with [name] -- Consider alternatives as multiple right ways to deal with him, primarily a question of how much her dignity needs (if anything) and what approach would be both caring enough and realistic enough to try out with him.  Ideas at least include a relatively short letter inquiring (been thinking about your message, need to understand better if you mean it the way it sounds, willing to talk?), a short statement that she's honestly offended (hurt, won't belabor it, sounds like he wants to break off, but willing to hear him out if otherwise), or let silence fall and let the ball be in his court.  As for what's happened, recommend consider how her own surprise message revising rehab costs covered as a loan to be paid back would tilt him emotionally (though in no way grounds for his holier-than-thou response).  Maintain trust that she does not have the power to tip him into suicidality, just a certain responsibility to stay fair and temperate in how she approaches it, if she interacts. Boundaries with grandkids -- Maintain trust that the boys have seen enough from their father and their grandmother to not be  susceptible to lies about her.  Pursue a normal relationship with them as able.  OK to let one or both them advocate to their father if they truly desire, but stay above reproach for perceived manipulation. Self-care -- Review health conditions for awareness and ability to follow recommendations Other recommendations/advice -- As may be noted above.  Continue to utilize previously learned skills ad lib. Medication compliance -- Maintain medication as prescribed and work faithfully with relevant prescriber(s) if any changes are desired or seem indicated. Crisis service -- Aware of call list and work-in appts.  Call the clinic on-call service, 988/hotline, 911, or present to [facility] or ER if any life-threatening psychiatric crisis. Followup -- Return for time as already scheduled.  Next scheduled visit with me 06/05/2024.  Next scheduled in this office 06/05/2024.  Lamar Kendall, PhD [name], PhD LP Clinical Psychologist, [facility] [address], [address] [place], [place] [address] [phone]

## 2024-06-05 ENCOUNTER — Ambulatory Visit: Admitting: Psychiatry

## 2024-06-05 DIAGNOSIS — Z62820 Parent-biological child conflict: Secondary | ICD-10-CM | POA: Diagnosis not present

## 2024-06-05 DIAGNOSIS — F4321 Adjustment disorder with depressed mood: Secondary | ICD-10-CM | POA: Diagnosis not present

## 2024-06-05 NOTE — Progress Notes (Signed)
 Psychotherapy Progress Note [facility] [name], PhD LP  Patient ID: Linda Atkinson)    MRN: [id number] Therapy format: Individual psychotherapy Date: 06/05/2024      Start: 3:06p     Stop: 3:53p     Time Spent: 47 min Location: In-person   Session narrative (presenting needs, interim history, self-report of stressors and symptoms, applications of prior therapy, status changes, and interventions made in session) Sister turned out to not quite have the rent, so Douglas gave it to her.  Awkward to talk about, but put at ease that she is 100% free to choose to give, and what's healthy or unhealthy has most to do with the spirit of it, e.g, just caring, fully consenting, and not having to worry about enabling.  Has friends tell her she's enabling, but it seems to be more concern for the stress she goes through.  It depends on her conception of how sister functions and whether she needs to be subsidized or trained.  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  Can get frustrated with the two men (one a fellow patient), who seem to go too soft on confrontation, but she realizes she's a fundamentally more bull-by-the-horns actor.  Re [name], still having a hard time reconciling whether to inquire after him or not.  Turns out she never reacted aloud to the manifesto letter, however galling it felt, all that's been said about it has been her unexpressed thoughts.  Discussed how far she'd like to bring him up to speed and again whether she has her temper under control well enough to risk opening the subject.    Therapeutic modalities: Cognitive Behavioral Therapy, Solution-Oriented/Positive Psychology, Ego-Supportive, and Assertiveness/Communication  Mental Status/Observations:  Appearance:    Casual     Behavior:  Appropriate  Motor:  Normal  Speech/Language:   Clear and Coherent and energetic  Affect:  Appropriate  Mood:  normal  Thought process:  normal  Thought content:    WNL  Sensory/Perceptual disturbances:    WNL  Orientation:  Fully oriented  Attention:  Good    Concentration:  Good  Memory:  WNL  Insight:    Good  Judgment:   Good  Impulse Control:  Good   Risk Assessment: Danger to Self: No Self-injurious Behavior: No Danger to Others: No Physical Aggression / Violence: No Duty to Warn: No Access to Firearms a concern: No  Assessment of progress:  progressing  Diagnosis:   ICD-10-CM   1. Adjustment disorder with depressed mood  F43.21     2. Relationship problem between parent and child  Z62.820      Plan:  Morale -- Continue with desired activities, relationships, self-care.  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 Reckoning with [name] -- Consider alternatives as multiple right ways to deal with him, primarily a question of how much her dignity needs (if anything) and what approach would be both caring enough and realistic enough to try out with him.  Ideas at least include a relatively short letter inquiring (been thinking about your message, need to understand better if you mean it the way it sounds, willing to talk?), a short statement that she's honestly offended (hurt, won't belabor it, sounds like he wants to  break off, but willing to hear him out if otherwise), or let silence fall and let the ball be in his court.  As for what's happened, recommend consider how her own surprise message revising rehab costs covered as a loan to be paid back would tilt him emotionally (though in no way grounds for his holier-than-thou response).  Maintain trust that she does not have the power to tip him into suicidality, just a certain responsibility to stay fair and temperate in how she approaches it, if she  interacts. Boundaries with grandkids -- Maintain trust that the boys have seen enough from their father and their grandmother to not be susceptible to lies about her.  Pursue a normal relationship with them as able.  OK to let one or both them advocate to their father if they truly desire, but stay above reproach for perceived manipulation. Self-care -- Review health conditions for awareness and ability to follow recommendations Other recommendations/advice -- As may be noted above.  Continue to utilize previously learned skills ad lib. Medication compliance -- Maintain medication as prescribed and work faithfully with relevant prescriber(s) if any changes are desired or seem indicated. Crisis service -- Aware of call list and work-in appts.  Call the clinic on-call service, 988/hotline, 911, or present to [facility] or ER if any life-threatening psychiatric crisis. Followup -- Return for time as already scheduled.  Next scheduled visit with me 06/22/2024.  Next scheduled in this office 06/22/2024.  Lamar Kendall, PhD [name], PhD LP Clinical Psychologist, [facility] [address], [address] [place], [place] [address] [phone]

## 2024-06-22 ENCOUNTER — Ambulatory Visit (INDEPENDENT_AMBULATORY_CARE_PROVIDER_SITE_OTHER): Payer: Self-pay | Admitting: Psychiatry

## 2024-06-22 DIAGNOSIS — Z91198 Patient's noncompliance with other medical treatment and regimen for other reason: Secondary | ICD-10-CM

## 2024-06-22 NOTE — Progress Notes (Signed)
 Admin note ([facility])  Patient ID: SHAVETTE SHOAFF  MRN: [id number] DATE: 06/22/2024  Message received today, cancelled short notice with explanation, caught out of town on a work assignment, insistent on appropriate cancellation charge.  RTO as scheduled.  Lamar Kendall, PhD [name], PhD LP Clinical Psychologist, [facility] [address], [address] [place], [place] [address] [phone]

## 2024-07-08 ENCOUNTER — Ambulatory Visit: Admitting: Psychiatry

## 2024-07-22 ENCOUNTER — Ambulatory Visit: Admitting: Psychiatry

## 2024-07-22 DIAGNOSIS — F4321 Adjustment disorder with depressed mood: Secondary | ICD-10-CM | POA: Diagnosis not present

## 2024-07-22 DIAGNOSIS — Z62820 Parent-biological child conflict: Secondary | ICD-10-CM | POA: Diagnosis not present
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
## 2024-08-05 ENCOUNTER — Ambulatory Visit (INDEPENDENT_AMBULATORY_CARE_PROVIDER_SITE_OTHER): Admitting: Psychiatry

## 2024-08-05 DIAGNOSIS — Z62820 Parent-biological child conflict: Secondary | ICD-10-CM

## 2024-08-05 DIAGNOSIS — F4321 Adjustment disorder with depressed mood: Secondary | ICD-10-CM | POA: Diagnosis not present
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
## 2024-08-19 ENCOUNTER — Ambulatory Visit: Admitting: Psychiatry

## 2024-09-02 ENCOUNTER — Ambulatory Visit: Admitting: Psychiatry

## 2024-09-02 DIAGNOSIS — Z62820 Parent-biological child conflict: Secondary | ICD-10-CM | POA: Diagnosis not present

## 2024-09-02 DIAGNOSIS — F4321 Adjustment disorder with depressed mood: Secondary | ICD-10-CM | POA: Diagnosis not present
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
## 2024-09-14 ENCOUNTER — Ambulatory Visit (INDEPENDENT_AMBULATORY_CARE_PROVIDER_SITE_OTHER): Admitting: Psychiatry

## 2024-09-14 DIAGNOSIS — R69 Illness, unspecified: Secondary | ICD-10-CM

## 2024-09-14 DIAGNOSIS — F4321 Adjustment disorder with depressed mood: Secondary | ICD-10-CM | POA: Diagnosis not present

## 2024-09-14 DIAGNOSIS — Z62820 Parent-biological child conflict: Secondary | ICD-10-CM

## 2024-09-14 NOTE — Progress Notes (Signed)
 Psychotherapy Progress Note [facility] Linda Kendall, PhD LP  Patient ID: Linda Atkinson)    MRN: [id number] Therapy format: Individual psychotherapy Date: 09/14/2024      Start: 1:01p     Stop: 2:01p     Time Spent: 60 min Location: In-person   Session narrative (presenting needs, interim history, self-report of stressors and symptoms, applications of prior therapy, status changes, and interventions made in session) No further development with Linda Atkinson and the car except to confirm to her   Has oscillated back to deciding to let Linda Atkinson go instead of confronting.  Does not want to raise her ire again or face the perceived futility.  Still disagrees with his last word over a year ago, but not clear she could get an acknowledgment, and she has good working relationship with her grandsons.  Supported in re-weighing the change and redirecting her efforts to the willing when it comes to family and the available when it comes to her work in the world.  Plus she has a different avenue of contact -- sort of.  Became aware this summer of Linda Atkinson's paternal uncle Linda Atkinson in [place] At[place]  dealing with Alzheimer's and going to memory care.  Aug 10 she was notified by Linda Atkinson, and she did the grace of forwarding that information to Linda Atkinson, with no reply; grandson Linda Atkinson just let her know he learned about Linda Atkinson, from [place], 3 months later.  Choosing not to ring Linda Atkinson up about the delay, as it just seems to be evidence of the same old self-absorbed mindset and a little indication of how frustrating he would make it if she tried.  Meanwhile, frustrated with Linda Atkinson, [employer] (fellow patient) who asked her to get cold/flu supplies for him while he was down with [employer] and badly underestimated the cost.  Got it in he head that he should know better what he was asking, and how he gave her $10 for her trouble when it cost $45.  Discussed her unfounded expectations that he be more knowing than that, to the effect of  recommending she tell him, but it turned out she already has apprised him of the actual cost and been repaid appropriately without further incident.  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  Appreciative of the perspective, would like to work further on her impatience and anger.  Tells of epiphanies from therapy 40 years ago in a weight management group, where she role played 3 scenarios from her relationship with mother.  Recalls favoritism toward her Linda Atkinson, later toward the 1st grandson Linda Atkinson, and then the birth of her sister Linda Atkinson when Linda Atkinson was 44 1/2, and all three involved Linda Atkinson glorifying someone else at her expense.  At one point Linda Atkinson told her she favored the others because her father favored her.  One dramatic way he did was when she got pregnant in high school, had the baby, and put up for adoption as requested, then tried to rescind within the 30-day window and was prevented.  Implications her father was very compassionate with her, while Linda Atkinson not, setting up this harsh internal mothering she can do  with herself.  Suggested she remember she worked hard enough to get out from under that messaging when it actually was her mother, she's certainly free at this age to finish the job by taking a stand about repeating the example with herself, and others, and as they say in Linda At[place], not let her live rent free in her head.  Therapeutic modalities: Cognitive Behavioral Therapy, Solution-Oriented/Positive Psychology, Ego-Supportive, and Insight-Oriented  Mental Status/Observations:  Appearance:   Casual     Behavior:  Appropriate  Motor:  Normal   Speech/Language:   Clear and Coherent  Affect:  Appropriate  Mood:  dysthymic and responsive  Thought process:  normal  Thought content:    Some resentment  Sensory/Perceptual disturbances:    WNL  Orientation:  Fully oriented  Attention:  Good    Concentration:  Good  Memory:  WNL  Insight:    Good  Judgment:   Good  Impulse Control:  Good   Risk Assessment: Danger to Self: No Self-injurious Behavior: No Danger to Others: No Physical Aggression / Violence: No Duty to Warn: No Access to Firearms a concern: No  Assessment of progress:  progressing  Diagnosis:   ICD-10-CM   1. Adjustment disorder with depressed mood  F43.21     2. Relationship problem between parent and child  Z62.820      Plan:  Morale -- Continue with desired activities, relationships, self-care.  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  For self-image and old habits, consider whether she is repeating her mother's treatment of her self (and eventually others) and take command of those reflexes.  Consider also forgiving mother for past condemnation. Reckoning with Linda Atkinson -- Consider alternatives as multiple right ways to deal with him, primarily a question of how much her dignity needs (if anything) and what approach would be both caring enough and realistic enough to try out with him.  Ideas at least include a relatively short letter inquiring (been thinking about your message, need to understand better if you mean it the way it sounds, willing to talk?), a short statement that she's honestly offended (hurt, won't belabor it, sounds like he wants to break off, but willing to hear him out if otherwise), or let silence fall and let the ball be in his court.  As for what's happened, recommend consider how her own surprise message revising rehab costs covered as a loan to be paid back would tilt him emotionally (though in no way grounds  for his holier-than-thou response).  Maintain trust that she does not have the power to tip him into suicidality, just a certain responsibility to stay fair and temperate in how she approaches it, if she interacts.  Endorse consulting witnesses to history, if so moved. Boundaries with grandkids -- Maintain trust that the boys have seen enough from their father and their grandmother to not be susceptible to lies about her.  Pursue a normal relationship with them as able.  OK to let one or both them advocate to their father if they truly desire, but stay above reproach for perceived manipulation. Boundaries with sister & niece -- OK to evaluate and provide charitable assistance, but encourage prompting them to think about attitudes and think through responsibilities, provide sanctions where needed, and approach them as adolescents in need of structure and contingencies to raise better choices and behavior.  Strive to not take anything personally, keep the definitions of loans and gifts clear.  Al-Anon support if desired. Self-care -- Review health conditions for awareness and ability to follow recommendations. Other recommendations/advice -- As may be noted above.  Continue to utilize previously learned skills ad lib. Medication compliance -- Maintain medication as prescribed and work faithfully with relevant prescriber(s) if any changes are desired or seem indicated. Crisis service -- Aware of call list and work-in appts.  Call the clinic on-call service, 988/hotline, 911, or present to [facility] or ER if any life-threatening psychiatric crisis. Followup -- Return for time as already scheduled.  Next scheduled visit with me 09/28/2024.  Next scheduled in this office 09/28/2024.  Lamar Kendall, PhD Linda Kendall, PhD LP Clinical Psychologist, [facility] [address], [address] [place], [place] [address] [phone]

## 2024-09-28 ENCOUNTER — Ambulatory Visit: Admitting: Psychiatry

## 2024-09-28 DIAGNOSIS — R69 Illness, unspecified: Secondary | ICD-10-CM | POA: Diagnosis not present

## 2024-09-28 DIAGNOSIS — Z62820 Parent-biological child conflict: Secondary | ICD-10-CM

## 2024-09-28 DIAGNOSIS — F4321 Adjustment disorder with depressed mood: Secondary | ICD-10-CM

## 2024-09-28 NOTE — Progress Notes (Signed)
 Psychotherapy Progress Note [facility] [name], PhD LP  Patient ID: MANISHA CANCEL)    MRN: [id number] Therapy format: Individual psychotherapy Date: 09/28/2024      Start: 1:04p     Stop: 1:53p     Time Spent: 49 min Location: In-person   Session narrative (presenting needs, interim history, self-report of stressors and symptoms, applications of prior therapy, status changes, and interventions made in session) Had a couple of bridges come loose in her mouth last week, 17 years after getting them done.  Has to redo for full dentures now, dentist found a deal to make the cost less than half, sustaining herself on soup right now.  Bright side, won't be tempted to feast too much this week.  Has found herself questioning decisions from across her adult life.  Laments again giving so much to [name] growing up only for him to reject her like he did a bit over a year now.  Still vacillating what to do with him, tempted again to confront the manifesto from Oct 2024.  Supportively confronted again that over a year has passed with only one businesslike message between them, not anything lie updated evidence of perspective or feelings, only what she has replayed.  Pressed again that she actually has three options, each with their advantages (confront in anger, take his temperature now, leave it in his court).  Can see the benefit of holding her tongue with her grandsons, certainly does not want to draw them in any further.  Recently told of [name] retrieving [employer] from [employer] years after giving it away unilaterally.  Pt reveals that she has been afraid, somehow, that [name] would become violent with her if provoked further, which seems quite unlikely over a year after last contact.  Queried whether she might actually be afraid of herself lashing out, and that that would be more the actual unthinkable.    Hx clarified about [name]'s stepfather, Wyn.  While Jahzaria  married him later, after [name] grew up, there were 3 years Wyn was with her and at her house a lot ([name]'s age 21-8) while still married, and that may be the kernel of truth he has ground on about scarring/traumatic experience and alleged depravity in his home as child.  Allowed it fits, still subject to checking in an actual conversation if she decides, on balance, to have one.  Standing offer to consult and/or host   Therapeutic modalities: Cognitive Behavioral Therapy, Solution-Oriented/Positive Psychology, and Ego-Supportive  Mental Status/Observations:  Appearance:   Casual     Behavior:  Appropriate  Motor:  Normal  Speech/Language:   Clear and Coherent  Affect:  Appropriate  Mood:  dysthymic and responsive\  Thought process:  normal  Thought content:    WNL  Sensory/Perceptual disturbances:    WNL  Orientation:  Fully oriented  Attention:  Good    Concentration:  Good  Memory:  WNL  Insight:    Good  Judgment:   Good  Impulse Control:  Good   Risk Assessment: Danger to Self: No Self-injurious Behavior: No Danger to Others: No Physical Aggression / Violence: No Duty to Warn: No Access to Firearms a concern: No  Assessment of progress:  stabilized  Diagnosis:   ICD-10-CM   1. Adjustment disorder with depressed mood  F43.21     2. Relationship problem between parent and child  Z62.820     3. r/o Dysthymia in childhood  R69  Plan:  Morale -- Continue with desired activities, relationships, self-care.  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  For self-image and old habits, consider whether she is repeating her mother's treatment of her self (and eventually others) and take command of those reflexes.  Consider also forgiving mother for past condemnation. Reckoning with [name] -- Consider alternatives as multiple right ways to deal with him, primarily a question of how much her dignity  needs (if anything) and what approach would be both caring enough and realistic enough to try out with him.  Ideas at least include a relatively short letter inquiring (been thinking about your message, need to understand better if you mean it the way it sounds, willing to talk?), a short statement that she's honestly offended (hurt, won't belabor it, sounds like he wants to break off, but willing to hear him out if otherwise), or let silence fall and let the ball be in his court.  As for what's happened, recommend consider how her own surprise message revising rehab costs covered as a loan to be paid back would tilt him emotionally (though in no way grounds for his holier-than-thou response).  Maintain trust that she does not have the power to tip him into suicidality, just a certain responsibility to stay fair and temperate in how she approaches it, if she interacts.  Endorse consulting witnesses to history, if so moved. Boundaries with grandkids -- Maintain trust that the boys have seen enough from their father and their grandmother to not be susceptible to lies about her.  Pursue a normal relationship with them as able.  OK to let one or both them advocate to their father if they truly desire, but stay above reproach for perceived manipulation. Boundaries with sister & niece -- OK to evaluate and provide charitable assistance, but encourage prompting them to think about attitudes and think through responsibilities, provide sanctions where needed, and approach them as adolescents in need of structure and contingencies to raise better choices and behavior.  Strive to not take anything personally, keep the definitions of loans and gifts clear.  Al-Anon support if desired. Self-care -- Review health conditions for awareness and ability to follow recommendations. Other recommendations/advice -- As may be noted above.  Continue to utilize previously learned skills ad lib. Medication compliance -- Maintain  medication as prescribed and work faithfully with relevant prescriber(s) if any changes are desired or seem indicated. Crisis service -- Aware of call list and work-in appts.  Call the clinic on-call service, 988/hotline, 911, or present to [facility] or ER if any life-threatening psychiatric crisis. Followup -- Return for time as already scheduled.  Next scheduled visit with me 10/12/2024.  Next scheduled in this office 10/12/2024.  Lamar Kendall, PhD [name], PhD LP Clinical Psychologist, [facility] [address] [place], [place] [address] [phone]

## 2024-10-12 ENCOUNTER — Ambulatory Visit: Admitting: Psychiatry

## 2024-10-26 ENCOUNTER — Ambulatory Visit (INDEPENDENT_AMBULATORY_CARE_PROVIDER_SITE_OTHER): Admitting: Psychiatry

## 2024-10-26 DIAGNOSIS — F4321 Adjustment disorder with depressed mood: Secondary | ICD-10-CM

## 2024-10-26 DIAGNOSIS — R69 Illness, unspecified: Secondary | ICD-10-CM

## 2024-10-26 DIAGNOSIS — Z62820 Parent-biological child conflict: Secondary | ICD-10-CM | POA: Diagnosis not present

## 2024-10-26 NOTE — Progress Notes (Signed)
 Psychotherapy Progress Note [facility] [name], PhD LP  Patient ID: Linda Atkinson)    MRN: [id number] Therapy format: Individual psychotherapy Date: 10/26/2024      Start: 1:10p     Stop: 2:00p     Time Spent: 50 min Location: In-person   Session narrative (presenting needs, interim history, self-report of stressors and symptoms, applications of prior therapy, status changes, and interventions made in session) Further struggle about [name], but went through another impulse to confront him line by line from his October 2024 letter.  Realized it's 16 mos since he actually spoke with her, so he's just not interested, and confronting him would be pointless.  Renewed support for two good ways to address -- either draft the brief letter asking if he still sees things the way he left them and offer to update the relationship, or let him deal with the consequences of his own alienating actions and keep her constructive efforts with the grandchildren, who are of age to conduct their own relationships.  [place], [place] [employer] (locked brakes, before guard barriers).  Further compounding the tragedy, he was hospitalized 2 days before his trucking company called family, after he slipped into a coma.  Years later, Eliska Hamil were in a car going up another mountain when an associate, Arland, tried to show off her driving and [name] -- when kinder -- told her she scared his mom.  Later, he checked a malicious rumor of something Jacques supposedly told a girlfriend -- offending her that he needed to ask -- and later his wife seized on the story as a way to [employer] and turn [name] back toward compulsively sacrificing for her.  Last understanding is still that she got the house and broke up with him, but [name] remains quiet all this time.    Extensive dental work coming up, pulling some remaining teeth and getting dentures,  with predicted 30-60 days to get accustomed to them.  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    Therapeutic modalities: Cognitive Behavioral Therapy, Solution-Oriented/Positive Psychology, and Ego-Supportive  Mental Status/Observations:  Appearance:   Casual     Behavior:  Appropriate  Motor:  Normal  Speech/Language:   Clear and Coherent  Affect:  Appropriate  Mood:  Varied by subject  Thought process:  normal  Thought content:    WNL  Sensory/Perceptual disturbances:    WNL  Orientation:  Fully oriented  Attention:  Good    Concentration:  Good  Memory:  WNL  Insight:    Good  Judgment:   Good  Impulse Control:  Good   Risk Assessment: Danger to Self: No Self-injurious Behavior: No Danger to Others: No Physical Aggression / Violence: No Duty to Warn: No Access to Firearms a concern: No  Assessment of progress:  progressing  Diagnosis:   ICD-10-CM   1. Adjustment disorder with depressed mood  F43.21     2. Relationship problem between parent and child  Z62.820     3. r/o Dysthymia in childhood  R69      Plan:  Morale -- Continue with desired activities, relationships, self-care.  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  For self-image and old habits, consider whether she is repeating her mother's treatment of her self (and eventually others) and take command  of those reflexes.  Consider also forgiving mother for past condemnation. Reckoning with [name] -- Consider alternatives as multiple right ways to deal with him, primarily a question of how much her dignity needs (if anything) and what approach would be both caring enough and realistic enough to try out with him.  Ideas at least include a relatively short letter inquiring (been thinking about your message, need to understand better if you mean it the way it sounds, willing to talk?), a short  statement that she's honestly offended (hurt, won't belabor it, sounds like he wants to break off, but willing to hear him out if otherwise), or let silence fall and let the ball be in his court.  As for what's happened, recommend consider how her own surprise message revising rehab costs covered as a loan to be paid back would tilt him emotionally (though in no way grounds for his holier-than-thou response).  Maintain trust that she does not have the power to tip him into suicidality, just a certain responsibility to stay fair and temperate in how she approaches it, if she interacts.  Endorse consulting witnesses to history, if so moved. Boundaries with grandkids -- Maintain trust that the boys have seen enough from their father and their grandmother to not be susceptible to lies about her.  Pursue a normal relationship with them as able.  OK to let one or both them advocate to their father if they truly desire, but stay above reproach for perceived manipulation. Boundaries with sister & niece -- OK to evaluate and provide charitable assistance, but encourage prompting them to think about attitudes and think through responsibilities, provide sanctions where needed, and approach them as adolescents in need of structure and contingencies to raise better choices and behavior.  Strive to not take anything personally, keep the definitions of loans and gifts clear.  Al-Anon support if desired. Self-care -- Review health conditions for awareness and ability to follow recommendations. Other recommendations/advice -- As may be noted above.  Continue to utilize previously learned skills ad lib. Medication compliance -- Maintain medication as prescribed and work faithfully with relevant prescriber(s) if any changes are desired or seem indicated. Crisis service -- Aware of call list and work-in appts.  Call the clinic on-call service, 988/hotline, 911, or present to [facility] or ER if any life-threatening psychiatric  crisis. Followup -- Return for time as already scheduled.  Next scheduled visit with me 11/09/2024.  Next scheduled in this office 11/09/2024.  Lamar Kendall, PhD [name], PhD LP Clinical Psychologist, [facility] [address], [address] [place], [place] [address] [phone]

## 2024-11-09 ENCOUNTER — Ambulatory Visit: Admitting: Psychiatry

## 2024-11-20 ENCOUNTER — Emergency Department (HOSPITAL_BASED_OUTPATIENT_CLINIC_OR_DEPARTMENT_OTHER)

## 2024-11-20 ENCOUNTER — Encounter (HOSPITAL_BASED_OUTPATIENT_CLINIC_OR_DEPARTMENT_OTHER): Payer: Self-pay | Admitting: Emergency Medicine

## 2024-11-20 ENCOUNTER — Other Ambulatory Visit: Payer: Self-pay

## 2024-11-20 ENCOUNTER — Emergency Department (HOSPITAL_BASED_OUTPATIENT_CLINIC_OR_DEPARTMENT_OTHER)
Admission: EM | Admit: 2024-11-20 | Discharge: 2024-11-20 | Disposition: A | Discharge status: Home / Self Care | Source: Ambulatory Visit | Attending: Emergency Medicine | Admitting: Emergency Medicine

## 2024-11-20 DIAGNOSIS — R42 Dizziness and giddiness: Secondary | ICD-10-CM | POA: Diagnosis not present

## 2024-11-20 DIAGNOSIS — J111 Influenza due to unidentified influenza virus with other respiratory manifestations: Secondary | ICD-10-CM | POA: Diagnosis not present

## 2024-11-20 DIAGNOSIS — R059 Cough, unspecified: Secondary | ICD-10-CM | POA: Diagnosis present

## 2024-11-20 LAB — BASIC METABOLIC PANEL WITH GFR
Anion gap: 14 (ref 5–15)
BUN: 12 mg/dL (ref 8–23)
CO2: 24 mmol/L (ref 22–32)
Calcium: 12.8 mg/dL — ABNORMAL HIGH (ref 8.9–10.3)
Chloride: 98 mmol/L (ref 98–111)
Creatinine, Ser: 1.14 mg/dL — ABNORMAL HIGH (ref 0.44–1.00)
GFR, Estimated: 49 mL/min — ABNORMAL LOW
Glucose, Bld: 128 mg/dL — ABNORMAL HIGH (ref 70–99)
Potassium: 4.2 mmol/L (ref 3.5–5.1)
Sodium: 136 mmol/L (ref 135–145)

## 2024-11-20 LAB — CBC
HCT: 40.9 % (ref 36.0–46.0)
Hemoglobin: 13.2 g/dL (ref 12.0–15.0)
MCH: 27.2 pg (ref 26.0–34.0)
MCHC: 32.3 g/dL (ref 30.0–36.0)
MCV: 84.3 fL (ref 80.0–100.0)
Platelets: 223 K/uL (ref 150–400)
RBC: 4.85 MIL/uL (ref 3.87–5.11)
RDW: 13.9 % (ref 11.5–15.5)
WBC: 6.1 K/uL (ref 4.0–10.5)
nRBC: 0 % (ref 0.0–0.2)

## 2024-11-20 LAB — CBG MONITORING, ED: Glucose-Capillary: 116 mg/dL — ABNORMAL HIGH (ref 70–99)

## 2024-11-20 LAB — MAGNESIUM: Magnesium: 1.8 mg/dL (ref 1.7–2.4)

## 2024-11-20 MED ORDER — SODIUM CHLORIDE 0.9 % IV BOLUS
1000.0000 mL | Freq: Once | INTRAVENOUS | Status: AC
  Administered 2024-11-20: 1000 mL via INTRAVENOUS

## 2024-11-20 NOTE — ED Provider Notes (Signed)
 " [facility] Provider Note   CSN: [id number] Arrival date & time: 11/20/24  1100     Patient presents with: Dizziness   Linda Atkinson is a 79 y.o. female presenting from home with 6 days of cough, congestion, sore throat, intermittent headaches, weakness.  Patient tested positive for influenza at urgent care earlier today.  She was advised to come to the hospital because she her sister noticed the patient had waxing and waning confusion for a few days, appeared to have some intermittent slurred speech, and also reported dizziness.  The patient clarifies that she has foggy headed, and she does not have vertigo, but feels that her legs are wobbly like Jell-O and 1 to give out on her.  She denies persistent headache.  She does live by herself but has a support network nearby with her grandson who checks in on her regularly as well as her sister who is here at the bedside.   HPI     Prior to Admission medications  Medication Sig Start Date End Date Taking? Authorizing Provider  lipase/protease/amylase (CREON ) 36000 UNITS CPEP capsule Take 2 capsules (72,000 Units total) by mouth 3 (three) times daily with meals. Patient not taking: Reported on 01/14/2024 12/15/21   Leigh Elspeth SQUIBB, MD  carvedilol  (COREG ) 3.125 MG tablet Take 1 tablet (3.125 mg total) by mouth 2 (two) times daily. Patient not taking: Reported on 01/14/2024 10/25/21 10/25/22  Dennise Lavada POUR, MD  carvedilol  (COREG ) 6.25 MG tablet Take 6.25 mg by mouth 2 (two) times daily.    [provider]  furosemide  (LASIX ) 20 MG tablet Take 20-40 mg by mouth daily.    [provider]  levothyroxine  (SYNTHROID ) 137 MCG tablet Take 175 mcg by mouth daily before breakfast. 10/28/19   [provider]  metFORMIN  (GLUCOPHAGE -XR) 500 MG 24 hr tablet Take 1,000 mg by mouth 2 (two) times daily with a meal. 11/29/23   [provider]  MOUNJARO 2.5 MG/0.5ML Pen Inject  2.5 mg into the skin once a week. Takes on Wednesday at 6 PM 11/29/23   [provider]  naproxen sodium (ALEVE) 220 MG tablet Take 440 mg by mouth daily as needed.    [provider]  traMADol  (ULTRAM ) 50 MG tablet Take 1 tablet (50 mg total) by mouth every 6 (six) hours as needed. 01/21/24 01/20/25  Rubin Calamity, MD  Vitamin D , Ergocalciferol , (DRISDOL ) 1.25 MG (50000 UNIT) CAPS capsule Take 50,000 Units by mouth 2 (two) times a week. 01/18/20   [provider]    Allergies: Ezetimibe and Lipitor [atorvastatin]    Review of Systems  Updated Vital Signs BP (!) 119/53   Pulse (!) 50   Temp 98 F (36.7 C)   Resp 17   Wt 90.8 kg   SpO2 100%   BMI 31.83 kg/m   Physical Exam Constitutional:      General: She is not in acute distress. HENT:     Head: Normocephalic and atraumatic.  Eyes:     Conjunctiva/sclera: Conjunctivae normal.     Pupils: Pupils are equal, round, and reactive to light.  Cardiovascular:     Rate and Rhythm: Normal rate and regular rhythm.  Pulmonary:     Effort: Pulmonary effort is normal. No respiratory distress.  Abdominal:     General: There is no distension.     Tenderness: There is no abdominal tenderness.  Skin:    General: Skin is warm and dry.  Neurological:     General: No focal deficit present.     Mental Status: She is alert and oriented to person, place, and time. Mental status is at baseline.     Sensory: No sensory deficit.     Motor: No weakness.     Coordination: Coordination normal.     Comments: No abnormal pronator drift; no difficulty to finger-to-nose  Psychiatric:        Mood and Affect: Mood normal.        Behavior: Behavior normal.     (all labs ordered are listed, but only abnormal results are displayed) Labs Reviewed  BASIC METABOLIC PANEL WITH GFR - Abnormal; Notable for the following components:      Result Value   Glucose, Bld 128 (*)    Creatinine, Ser 1.14 (*)    Calcium  12.8 (*)    GFR,  Estimated 49 (*)    All other components within normal limits  CBG MONITORING, ED - Abnormal; Notable for the following components:   Glucose-Capillary 116 (*)    All other components within normal limits  CBC  MAGNESIUM     EKG: EKG Interpretation Date/Time:  Friday November 20 2024 11:11:29 EST Ventricular Rate:  52 PR Interval:  183 QRS Duration:  90 QT Interval:  415 QTC Calculation: 386 R Axis:   -26  Text Interpretation: Sinus rhythm Abnormal R-wave progression, late transition Probable LVH with secondary repol abnrm Confirmed by Cottie Cough [phone]) on 11/20/2024 11:29:10 AM  Radiology: [facility] Chest 2 View Result Date: 11/20/2024 CLINICAL DATA:  Dizziness EXAM: CHEST - 2 VIEW COMPARISON:  October 20, 2021 FINDINGS: The heart size and mediastinal contours are within normal limits. Both lungs are clear. The visualized skeletal structures are unremarkable. IMPRESSION: No active cardiopulmonary disease. Electronically Signed   By: Lynwood Landy Raddle M.D.   On: 11/20/2024 13:07     Procedures   Medications Ordered in the ED  sodium chloride  0.9 % bolus 1,000 mL (0 mLs Intravenous Stopped 11/20/24 1357)                                    Medical Decision Making Amount and/or Complexity of Data Reviewed Labs: ordered. Radiology: ordered.   Patient is presenting to ED with suspected viral syndrome, now on day 6 of symptoms.  Her whole presentation is quite consistent with influenza to me.  I explained to the patient and her family the influenza can cause waxing waning confusion, certainly muscle weakness, intermittent headaches and sore throat.  I think it is reasonable to check her electrolyte levels, creatinine function, and give her some fluid bolus here as well.  But I do not think this presentation is consistent at all with a stroke or TIA.  She does not have ataxia or sella Beller abnormalities on exam, and it appears that her dizziness is in fact generalized weakness or  wobbliness in her legs.  However, she is able to ambulate here.  She is quite adamant on going home if her workup is negative.  She has a support system that can check in on her at home, will keep her phone with her, and also reports she will take her time with standing and walking.  She does not appear encephalopathic or confused to me at this time, other symptoms appear to worsen at night.  I advised that she avoid any medications involving Benadryl which can worsen confusion,  and she is already not taking these.  She is not a candidate for Tamiflu now on day 6 of symptoms -very little efficacy expected.  I do expect her symptoms to improve over the next several days.  However, explained to the patient and her family that brain fog and even confusion can last for a longer period of time after influenza, she will need PCP follow-up.  I do not think that this presentation is consistent with viral meningitis.  She has no persistent headache, no meningismus, and I do not think she is needing an emergent lumbar puncture at this time.     Final diagnoses:  Influenza    ED Discharge Orders     None          Cottie [name] PARAS, MD 11/20/24 1454  "

## 2024-11-20 NOTE — ED Notes (Signed)
 Discharge instructions reviewed with patient. Patient questions answered and opportunity for education reviewed. Patient voices understanding of discharge instructions with no further questions. Patient ambulatory with steady gait to lobby.

## 2024-11-20 NOTE — ED Triage Notes (Signed)
 Reports had flu 1 week ago , dizzy x 3 days , Hx vertigo years ago she said .  Sent from [facility] concerned for stroke .  Pt alert and oriented x 4 , unsteady gait per daughter .

## 2024-11-20 NOTE — ED Notes (Signed)
Pt ambulatory with no issues

## 2024-11-20 NOTE — Discharge Instructions (Addendum)
 Influenza can last 7 days and cause fevers, congestion, confusion and weakness and wobbliness.  Make sure that you are using a walker or assistance whenever you are standing up.  Drink plenty of water.  Have family members check in with you regularly.  Schedule follow-up appointment next week with the primary care clinic for recheck of your symptoms.

## 2024-11-24 ENCOUNTER — Ambulatory Visit: Admitting: Psychiatry

## 2024-11-24 NOTE — Progress Notes (Signed)
 Admin note   Patient ID: Linda Atkinson  MRN: [id number] DATE: 11/24/2024  Pt arrived masked, in c/o sister, citing testing positive for flu A last week, reportedly asking if I still wanted to see her.  Unable to return staff message immediately, being involved in prior session, so reply OK to see her coincided with Pt becoming irritated enough to leave and insist on not being charged.  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  Understandably irritated, and assured now that it's clear no charge, cancelling is sensible enough.  RS as able.  Lamar Kendall, PhD [name], PhD LP Clinical Psychologist, [facility] [address] [place], [place] [address] [phone]

## 2024-11-24 NOTE — Progress Notes (Deleted)
 Psychotherapy Progress Note [facility] [name], PhD LP  Patient ID: Linda Atkinson [facility])    MRN: [id number] Therapy format: {Therapy Types:21967::Individual psychotherapy} Date: 11/24/2024      Start: ***:***     Stop: ***:***     Time Spent: *** min Location: {SvcLoc:22530::In-person}   Session narrative (presenting needs, interim history, self-report of stressors and symptoms, applications of prior therapy, status changes, and interventions made in session) ***  Therapeutic modalities: {AM:23362::Cognitive Behavioral Therapy,Solution-Oriented/Positive Psychology}  Mental Status/Observations:  Appearance:   {PSY:22683}     Behavior:  {PSY:21022743}  Motor:  {PSY:22302}  Speech/Language:   {PSY:22685}  Affect:  {PSY:22687}  Mood:  {PSY:31886}  Thought process:  {PSY:31888}  Thought content:    {PSY:[phone]}  Sensory/Perceptual disturbances:    {[phone]}  Orientation:  {Psych Orientation:23301::Fully oriented}  Attention:  {Good-Fair-Poor ratings:23770::Good}    Concentration:  {Good-Fair-Poor ratings:23770::Good}  Memory:  {[phone]}  Insight:    {Good-Fair-Poor ratings:23770::Good}  Judgment:   {Good-Fair-Poor ratings:23770::Good}  Impulse Control:  {Good-Fair-Poor ratings:23770::Good}   Risk Assessment: Danger to Self: {Risk:22599::No} Self-injurious Behavior: {Risk:22599::No} Danger to Others: {Risk:22599::No} Physical Aggression / Violence: {Risk:22599::No} Duty to Warn: {AMYesNo:22526::No} Access to Firearms a concern: {AMYesNo:22526::No}  Assessment of progress:  {Progress:22147::progressing}  Diagnosis: No diagnosis found. Plan:  *** Other recommendations/advice -- As may be noted above.  Continue to utilize previously learned skills ad lib. Medication compliance -- Maintain medication as prescribed and work faithfully with relevant prescriber(s) if any changes are desired or seem  indicated. Crisis service -- Aware of call list and work-in appts.  Call the clinic on-call service, 988/hotline, 911, or present to [facility] or ER if any life-threatening psychiatric crisis. Followup -- No follow-ups on file.  Next scheduled visit with me 12/08/2024.  Next scheduled in this office 12/08/2024.  Lamar Kendall, PhD [name], PhD LP Clinical Psychologist, [facility] [address] [place], [place] [address] [phone]

## 2024-12-08 ENCOUNTER — Ambulatory Visit: Admitting: Psychiatry

## 2024-12-21 ENCOUNTER — Ambulatory Visit: Admitting: Psychiatry
